# Patient Record
Sex: Female | Born: 1945
Health system: Southern US, Community
[De-identification: ages and names within clinical notes are randomized; demographics above are authoritative.]

## PROBLEM LIST (undated history)

## (undated) DIAGNOSIS — Z0271 Encounter for disability determination: Secondary | ICD-10-CM

## (undated) DIAGNOSIS — E785 Hyperlipidemia, unspecified: Secondary | ICD-10-CM

## (undated) DIAGNOSIS — M8580 Other specified disorders of bone density and structure, unspecified site: Secondary | ICD-10-CM

## (undated) DIAGNOSIS — N189 Chronic kidney disease, unspecified: Secondary | ICD-10-CM

## (undated) DIAGNOSIS — T884XXA Failed or difficult intubation, initial encounter: Secondary | ICD-10-CM

## (undated) DIAGNOSIS — K219 Gastro-esophageal reflux disease without esophagitis: Secondary | ICD-10-CM

## (undated) DIAGNOSIS — H269 Unspecified cataract: Secondary | ICD-10-CM

## (undated) DIAGNOSIS — I639 Cerebral infarction, unspecified: Secondary | ICD-10-CM

## (undated) DIAGNOSIS — Z5189 Encounter for other specified aftercare: Secondary | ICD-10-CM

## (undated) DIAGNOSIS — R569 Unspecified convulsions: Secondary | ICD-10-CM

## (undated) DIAGNOSIS — T753XXA Motion sickness, initial encounter: Secondary | ICD-10-CM

## (undated) DIAGNOSIS — I10 Essential (primary) hypertension: Secondary | ICD-10-CM

## (undated) DIAGNOSIS — S22060A Wedge compression fracture of T7-T8 vertebra, initial encounter for closed fracture: Secondary | ICD-10-CM

## (undated) DIAGNOSIS — M1990 Unspecified osteoarthritis, unspecified site: Secondary | ICD-10-CM

## (undated) DIAGNOSIS — R011 Cardiac murmur, unspecified: Secondary | ICD-10-CM

## (undated) DIAGNOSIS — T7840XA Allergy, unspecified, initial encounter: Secondary | ICD-10-CM

## (undated) DIAGNOSIS — S22069A Unspecified fracture of T7-T8 vertebra, initial encounter for closed fracture: Secondary | ICD-10-CM

## (undated) DIAGNOSIS — C801 Malignant (primary) neoplasm, unspecified: Secondary | ICD-10-CM

## (undated) HISTORY — DX: Wedge compression fracture of T7-t8 vertebra, initial encounter for closed fracture: S22.060A

## (undated) HISTORY — DX: Cerebral infarction, unspecified: I63.9

## (undated) HISTORY — DX: Gastro-esophageal reflux disease without esophagitis: K21.9

## (undated) HISTORY — DX: Unspecified fracture of T7-t8 vertebra, initial encounter for closed fracture: S22.069A

## (undated) HISTORY — PX: EYE SURGERY: SHX253

## (undated) HISTORY — PX: CATARACT EXTRACTION: SUR2

## (undated) HISTORY — PX: FRACTURE SURGERY: SHX138

## (undated) HISTORY — PX: TOE FUSION: SHX1070

## (undated) HISTORY — DX: Hyperlipidemia, unspecified: E78.5

## (undated) HISTORY — DX: Unspecified cataract: H26.9

## (undated) HISTORY — PX: SPINE SURGERY: SHX786

## (undated) HISTORY — DX: Other specified disorders of bone density and structure, unspecified site: M85.80

## (undated) HISTORY — DX: Essential (primary) hypertension: I10

## (undated) HISTORY — DX: Malignant (primary) neoplasm, unspecified: C80.1

## (undated) HISTORY — DX: Unspecified convulsions: R56.9

## (undated) HISTORY — DX: Encounter for disability determination: Z02.71

## (undated) HISTORY — DX: Allergy, unspecified, initial encounter: T78.40XA

## (undated) HISTORY — PX: BUNIONECTOMY: SHX129

## (undated) HISTORY — DX: Cardiac murmur, unspecified: R01.1

## (undated) HISTORY — DX: Encounter for other specified aftercare: Z51.89

---

## 1983-06-07 HISTORY — PX: CHOLECYSTECTOMY: SHX55

## 1993-06-06 HISTORY — PX: OTHER SURGICAL HISTORY: SHX169

## 1994-06-06 HISTORY — PX: BREAST SURGERY: SHX581

## 1995-06-07 DIAGNOSIS — C50919 Malignant neoplasm of unspecified site of unspecified female breast: Secondary | ICD-10-CM

## 1995-06-07 HISTORY — DX: Malignant neoplasm of unspecified site of unspecified female breast: C50.919

## 1997-12-18 ENCOUNTER — Other Ambulatory Visit: Admission: RE | Admit: 1997-12-18 | Discharge: 1997-12-18 | Payer: Self-pay | Admitting: Obstetrics and Gynecology

## 1998-03-02 ENCOUNTER — Ambulatory Visit (HOSPITAL_COMMUNITY): Admission: RE | Admit: 1998-03-02 | Discharge: 1998-03-02 | Payer: Self-pay | Admitting: Obstetrics and Gynecology

## 1999-05-04 ENCOUNTER — Other Ambulatory Visit: Admission: RE | Admit: 1999-05-04 | Discharge: 1999-05-04 | Payer: Self-pay | Admitting: Obstetrics and Gynecology

## 2000-07-11 ENCOUNTER — Other Ambulatory Visit: Admission: RE | Admit: 2000-07-11 | Discharge: 2000-07-11 | Payer: Self-pay | Admitting: Obstetrics and Gynecology

## 2001-11-12 ENCOUNTER — Other Ambulatory Visit: Admission: RE | Admit: 2001-11-12 | Discharge: 2001-11-12 | Payer: Self-pay | Admitting: Obstetrics and Gynecology

## 2003-02-05 ENCOUNTER — Encounter: Payer: Self-pay | Admitting: Family Medicine

## 2003-03-04 ENCOUNTER — Other Ambulatory Visit: Admission: RE | Admit: 2003-03-04 | Discharge: 2003-03-04 | Payer: Self-pay | Admitting: Obstetrics and Gynecology

## 2003-06-07 ENCOUNTER — Inpatient Hospital Stay (HOSPITAL_COMMUNITY): Admission: AC | Admit: 2003-06-07 | Discharge: 2003-06-17 | Payer: Self-pay

## 2003-06-07 ENCOUNTER — Encounter: Payer: Self-pay | Admitting: Family Medicine

## 2003-06-07 DIAGNOSIS — J80 Acute respiratory distress syndrome: Secondary | ICD-10-CM

## 2003-06-07 HISTORY — PX: ENDARTERECTOMY: SHX5162

## 2003-06-07 HISTORY — DX: Acute respiratory distress syndrome: J80

## 2003-06-07 LAB — CONVERTED CEMR LAB: Hgb A1c MFr Bld: 6.3 %

## 2003-06-10 ENCOUNTER — Encounter (INDEPENDENT_AMBULATORY_CARE_PROVIDER_SITE_OTHER): Payer: Self-pay | Admitting: *Deleted

## 2003-06-11 ENCOUNTER — Encounter: Payer: Self-pay | Admitting: Cardiovascular Disease

## 2003-06-17 ENCOUNTER — Inpatient Hospital Stay (HOSPITAL_COMMUNITY)
Admission: RE | Admit: 2003-06-17 | Discharge: 2003-07-01 | Payer: Self-pay | Admitting: Physical Medicine & Rehabilitation

## 2003-07-04 ENCOUNTER — Encounter
Admission: RE | Admit: 2003-07-04 | Discharge: 2003-08-01 | Payer: Self-pay | Admitting: Physical Medicine & Rehabilitation

## 2003-07-24 ENCOUNTER — Encounter
Admission: RE | Admit: 2003-07-24 | Discharge: 2003-10-22 | Payer: Self-pay | Admitting: Physical Medicine & Rehabilitation

## 2003-09-05 ENCOUNTER — Encounter: Payer: Self-pay | Admitting: Family Medicine

## 2004-01-16 ENCOUNTER — Encounter
Admission: RE | Admit: 2004-01-16 | Discharge: 2004-04-15 | Payer: Self-pay | Admitting: Physical Medicine & Rehabilitation

## 2004-02-05 ENCOUNTER — Encounter: Payer: Self-pay | Admitting: Family Medicine

## 2004-03-18 ENCOUNTER — Encounter
Admission: RE | Admit: 2004-03-18 | Discharge: 2004-06-16 | Payer: Self-pay | Admitting: Physical Medicine & Rehabilitation

## 2004-04-30 ENCOUNTER — Ambulatory Visit: Payer: Self-pay | Admitting: Internal Medicine

## 2004-05-24 ENCOUNTER — Emergency Department: Payer: Self-pay | Admitting: Emergency Medicine

## 2004-08-04 ENCOUNTER — Ambulatory Visit: Payer: Self-pay | Admitting: Family Medicine

## 2004-08-04 LAB — CONVERTED CEMR LAB: Hgb A1c MFr Bld: 5.2 %

## 2004-08-12 ENCOUNTER — Ambulatory Visit: Payer: Self-pay | Admitting: Family Medicine

## 2004-08-16 ENCOUNTER — Encounter: Admission: RE | Admit: 2004-08-16 | Discharge: 2004-08-16 | Payer: Self-pay | Admitting: Orthopedic Surgery

## 2004-08-18 ENCOUNTER — Ambulatory Visit (HOSPITAL_BASED_OUTPATIENT_CLINIC_OR_DEPARTMENT_OTHER): Admission: RE | Admit: 2004-08-18 | Discharge: 2004-08-18 | Payer: Self-pay | Admitting: Orthopedic Surgery

## 2004-08-18 ENCOUNTER — Ambulatory Visit (HOSPITAL_COMMUNITY): Admission: RE | Admit: 2004-08-18 | Discharge: 2004-08-18 | Payer: Self-pay | Admitting: Orthopedic Surgery

## 2004-10-06 ENCOUNTER — Ambulatory Visit: Payer: Self-pay | Admitting: Family Medicine

## 2004-10-08 ENCOUNTER — Ambulatory Visit: Payer: Self-pay | Admitting: Family Medicine

## 2004-10-15 ENCOUNTER — Encounter: Admission: RE | Admit: 2004-10-15 | Discharge: 2004-10-15 | Payer: Self-pay | Admitting: Family Medicine

## 2004-11-24 ENCOUNTER — Ambulatory Visit: Payer: Self-pay | Admitting: Family Medicine

## 2004-11-26 ENCOUNTER — Ambulatory Visit: Payer: Self-pay | Admitting: Family Medicine

## 2005-03-06 ENCOUNTER — Encounter: Payer: Self-pay | Admitting: Family Medicine

## 2005-03-06 LAB — CONVERTED CEMR LAB
Hgb A1c MFr Bld: 5.5 %
Microalbumin U total vol: 4 mg/L

## 2005-03-07 ENCOUNTER — Ambulatory Visit: Payer: Self-pay | Admitting: Family Medicine

## 2005-03-10 ENCOUNTER — Ambulatory Visit: Payer: Self-pay | Admitting: Family Medicine

## 2005-05-02 ENCOUNTER — Ambulatory Visit: Payer: Self-pay | Admitting: Family Medicine

## 2005-06-21 ENCOUNTER — Ambulatory Visit (HOSPITAL_BASED_OUTPATIENT_CLINIC_OR_DEPARTMENT_OTHER): Admission: RE | Admit: 2005-06-21 | Discharge: 2005-06-22 | Payer: Self-pay | Admitting: Orthopedic Surgery

## 2005-09-20 DIAGNOSIS — M8580 Other specified disorders of bone density and structure, unspecified site: Secondary | ICD-10-CM

## 2005-09-20 HISTORY — DX: Other specified disorders of bone density and structure, unspecified site: M85.80

## 2005-09-28 HISTORY — PX: CT LUNG SCREENING: HXRAD848

## 2005-10-11 ENCOUNTER — Ambulatory Visit: Payer: Self-pay | Admitting: Family Medicine

## 2005-10-14 ENCOUNTER — Ambulatory Visit: Payer: Self-pay | Admitting: Family Medicine

## 2005-12-15 DIAGNOSIS — Z0271 Encounter for disability determination: Secondary | ICD-10-CM

## 2005-12-15 HISTORY — DX: Encounter for disability determination: Z02.71

## 2006-02-04 ENCOUNTER — Encounter: Payer: Self-pay | Admitting: Family Medicine

## 2006-04-12 ENCOUNTER — Ambulatory Visit: Payer: Self-pay | Admitting: Family Medicine

## 2006-04-12 LAB — CONVERTED CEMR LAB: Microalbumin U total vol: 5.7 mg/L

## 2006-04-17 ENCOUNTER — Ambulatory Visit: Payer: Self-pay | Admitting: Family Medicine

## 2006-05-05 ENCOUNTER — Ambulatory Visit: Payer: Self-pay | Admitting: Family Medicine

## 2006-06-05 ENCOUNTER — Ambulatory Visit: Payer: Self-pay | Admitting: Family Medicine

## 2006-06-15 ENCOUNTER — Encounter (INDEPENDENT_AMBULATORY_CARE_PROVIDER_SITE_OTHER): Payer: Self-pay | Admitting: Internal Medicine

## 2006-07-12 ENCOUNTER — Ambulatory Visit: Payer: Self-pay | Admitting: Family Medicine

## 2006-07-26 ENCOUNTER — Encounter (INDEPENDENT_AMBULATORY_CARE_PROVIDER_SITE_OTHER): Payer: Self-pay | Admitting: Internal Medicine

## 2006-09-26 LAB — HM MAMMOGRAPHY: HM Mammogram: NORMAL

## 2006-10-04 ENCOUNTER — Encounter: Payer: Self-pay | Admitting: Family Medicine

## 2006-10-04 DIAGNOSIS — E119 Type 2 diabetes mellitus without complications: Secondary | ICD-10-CM | POA: Insufficient documentation

## 2006-10-04 DIAGNOSIS — I739 Peripheral vascular disease, unspecified: Secondary | ICD-10-CM | POA: Insufficient documentation

## 2006-10-04 DIAGNOSIS — D0590 Unspecified type of carcinoma in situ of unspecified breast: Secondary | ICD-10-CM | POA: Insufficient documentation

## 2006-10-04 DIAGNOSIS — I10 Essential (primary) hypertension: Secondary | ICD-10-CM | POA: Insufficient documentation

## 2006-10-04 DIAGNOSIS — K219 Gastro-esophageal reflux disease without esophagitis: Secondary | ICD-10-CM | POA: Insufficient documentation

## 2006-10-12 ENCOUNTER — Ambulatory Visit: Payer: Self-pay | Admitting: Family Medicine

## 2006-10-13 LAB — CONVERTED CEMR LAB: Hgb A1c MFr Bld: 5.7 % (ref 4.6–6.1)

## 2006-10-24 ENCOUNTER — Encounter: Payer: Self-pay | Admitting: Family Medicine

## 2006-11-07 ENCOUNTER — Ambulatory Visit: Payer: Self-pay | Admitting: Family Medicine

## 2007-01-02 ENCOUNTER — Ambulatory Visit: Payer: Self-pay | Admitting: Family Medicine

## 2007-03-28 LAB — CONVERTED CEMR LAB: Pap Smear: NORMAL

## 2007-05-09 ENCOUNTER — Ambulatory Visit: Payer: Self-pay | Admitting: Family Medicine

## 2007-05-10 LAB — CONVERTED CEMR LAB
ALT: 20 units/L (ref 0–35)
AST: 21 units/L (ref 0–37)
Albumin: 4.2 g/dL (ref 3.5–5.2)
Alkaline Phosphatase: 88 units/L (ref 39–117)
Basophils Absolute: 0 10*3/uL (ref 0.0–0.1)
Eosinophils Absolute: 0.2 10*3/uL (ref 0.2–0.7)
Glucose, Bld: 86 mg/dL (ref 70–99)
Hgb A1c MFr Bld: 5.8 % (ref 4.6–6.1)
LDL Cholesterol: 164 mg/dL — ABNORMAL HIGH (ref 0–99)
Lymphocytes Relative: 15 % (ref 12–46)
Lymphs Abs: 1.5 10*3/uL (ref 0.7–4.0)
MCV: 90 fL (ref 78.0–100.0)
Microalb Creat Ratio: 230.5 mg/g — ABNORMAL HIGH (ref 0.0–30.0)
Microalb, Ur: 18.6 mg/dL — ABNORMAL HIGH (ref 0.00–1.89)
Neutrophils Relative %: 76 % (ref 43–77)
Platelets: 264 10*3/uL (ref 150–400)
Potassium: 4.7 meq/L (ref 3.5–5.3)
RDW: 15.4 % (ref 11.5–15.5)
Sodium: 142 meq/L (ref 135–145)
TSH: 1.966 microintl units/mL (ref 0.350–5.50)
Total Bilirubin: 0.5 mg/dL (ref 0.3–1.2)
Total Protein: 7.4 g/dL (ref 6.0–8.3)
Triglycerides: 266 mg/dL — ABNORMAL HIGH (ref <150)
VLDL: 53 mg/dL — ABNORMAL HIGH (ref 0–40)
WBC: 10.2 10*3/uL (ref 4.0–10.5)

## 2007-05-14 ENCOUNTER — Ambulatory Visit: Payer: Self-pay | Admitting: Family Medicine

## 2007-06-18 ENCOUNTER — Ambulatory Visit: Payer: Self-pay | Admitting: Family Medicine

## 2007-06-19 LAB — CONVERTED CEMR LAB: AST: 26 units/L (ref 0–37)

## 2007-07-19 ENCOUNTER — Ambulatory Visit: Payer: Self-pay | Admitting: Family Medicine

## 2007-07-19 LAB — CONVERTED CEMR LAB
Bilirubin Urine: NEGATIVE
Glucose, Urine, Semiquant: NEGATIVE
Ketones, urine, test strip: NEGATIVE
Nitrite: NEGATIVE
Protein, U semiquant: NEGATIVE
Urobilinogen, UA: NEGATIVE

## 2007-07-20 ENCOUNTER — Encounter (INDEPENDENT_AMBULATORY_CARE_PROVIDER_SITE_OTHER): Payer: Self-pay | Admitting: Internal Medicine

## 2007-08-08 ENCOUNTER — Encounter (INDEPENDENT_AMBULATORY_CARE_PROVIDER_SITE_OTHER): Payer: Self-pay | Admitting: Internal Medicine

## 2007-08-08 ENCOUNTER — Ambulatory Visit: Payer: Self-pay | Admitting: Family Medicine

## 2007-08-13 LAB — CONVERTED CEMR LAB
ALT: 13 units/L (ref 0–35)
Cholesterol: 177 mg/dL (ref 0–200)
Total CHOL/HDL Ratio: 3.2
Triglycerides: 150 mg/dL — ABNORMAL HIGH (ref <150)
VLDL: 30 mg/dL (ref 0–40)

## 2007-08-21 ENCOUNTER — Ambulatory Visit: Payer: Self-pay | Admitting: Family Medicine

## 2007-08-21 DIAGNOSIS — M549 Dorsalgia, unspecified: Secondary | ICD-10-CM | POA: Insufficient documentation

## 2007-08-31 ENCOUNTER — Ambulatory Visit: Payer: Self-pay | Admitting: Family Medicine

## 2007-08-31 LAB — CONVERTED CEMR LAB
Bilirubin Urine: NEGATIVE
Ketones, urine, test strip: NEGATIVE
Specific Gravity, Urine: 1.01
pH: 6.5

## 2007-10-05 ENCOUNTER — Ambulatory Visit: Payer: Self-pay | Admitting: Family Medicine

## 2007-10-05 LAB — CONVERTED CEMR LAB
Glucose, Urine, Semiquant: NEGATIVE
Ketones, urine, test strip: NEGATIVE
Nitrite: NEGATIVE
Protein, U semiquant: NEGATIVE
Specific Gravity, Urine: 1.015

## 2007-10-06 ENCOUNTER — Encounter: Payer: Self-pay | Admitting: Family Medicine

## 2007-10-25 ENCOUNTER — Ambulatory Visit: Payer: Self-pay | Admitting: Family Medicine

## 2007-11-05 ENCOUNTER — Telehealth: Payer: Self-pay | Admitting: Family Medicine

## 2007-11-15 ENCOUNTER — Encounter: Payer: Self-pay | Admitting: Family Medicine

## 2008-04-29 ENCOUNTER — Ambulatory Visit: Payer: Self-pay | Admitting: Family Medicine

## 2008-04-29 LAB — CONVERTED CEMR LAB
Glucose, Urine, Semiquant: NEGATIVE
Ketones, urine, test strip: NEGATIVE
Nitrite: NEGATIVE
Protein, U semiquant: 100

## 2008-05-16 ENCOUNTER — Ambulatory Visit: Payer: Self-pay | Admitting: Family Medicine

## 2008-05-17 LAB — CONVERTED CEMR LAB
Albumin: 4 g/dL (ref 3.5–5.2)
Alkaline Phosphatase: 70 units/L (ref 39–117)
BUN: 31 mg/dL — ABNORMAL HIGH (ref 6–23)
Basophils Relative: 1.3 % (ref 0.0–3.0)
Creatinine, Ser: 1.1 mg/dL (ref 0.4–1.2)
Creatinine,U: 77.8 mg/dL
Eosinophils Relative: 1.6 % (ref 0.0–5.0)
GFR calc Af Amer: 65 mL/min
Glucose, Bld: 88 mg/dL (ref 70–99)
HCT: 36.9 % (ref 36.0–46.0)
Hemoglobin: 12.6 g/dL (ref 12.0–15.0)
MCV: 89.4 fL (ref 78.0–100.0)
Microalb Creat Ratio: 18 mg/g (ref 0.0–30.0)
Monocytes Absolute: 0.7 10*3/uL (ref 0.1–1.0)
Monocytes Relative: 10.9 % (ref 3.0–12.0)
Neutro Abs: 3.4 10*3/uL (ref 1.4–7.7)
Platelets: 302 10*3/uL (ref 150–400)
Potassium: 4 meq/L (ref 3.5–5.1)
RBC: 4.13 M/uL (ref 3.87–5.11)
Total CHOL/HDL Ratio: 3.3
Total Protein: 7.6 g/dL (ref 6.0–8.3)
WBC: 6.1 10*3/uL (ref 4.5–10.5)

## 2008-05-21 ENCOUNTER — Ambulatory Visit: Payer: Self-pay | Admitting: Family Medicine

## 2008-06-09 ENCOUNTER — Encounter: Payer: Self-pay | Admitting: Family Medicine

## 2008-06-10 ENCOUNTER — Encounter: Payer: Self-pay | Admitting: Family Medicine

## 2008-07-03 ENCOUNTER — Ambulatory Visit: Payer: Self-pay | Admitting: Family Medicine

## 2008-07-29 ENCOUNTER — Ambulatory Visit: Payer: Self-pay | Admitting: Family Medicine

## 2008-07-29 LAB — CONVERTED CEMR LAB
Protein, U semiquant: 30
Specific Gravity, Urine: <1.005
pH: 6.5

## 2008-07-30 ENCOUNTER — Encounter: Payer: Self-pay | Admitting: Family Medicine

## 2008-08-21 ENCOUNTER — Ambulatory Visit: Payer: Self-pay | Admitting: Family Medicine

## 2008-08-21 LAB — CONVERTED CEMR LAB
Bilirubin Urine: NEGATIVE
Glucose, Urine, Semiquant: NEGATIVE
Protein, U semiquant: NEGATIVE
pH: 6

## 2008-08-22 ENCOUNTER — Encounter: Payer: Self-pay | Admitting: Family Medicine

## 2008-09-29 ENCOUNTER — Encounter: Payer: Self-pay | Admitting: Family Medicine

## 2008-10-02 ENCOUNTER — Telehealth: Payer: Self-pay | Admitting: Family Medicine

## 2008-12-09 ENCOUNTER — Telehealth: Payer: Self-pay | Admitting: Family Medicine

## 2008-12-19 ENCOUNTER — Encounter: Payer: Self-pay | Admitting: Family Medicine

## 2008-12-19 ENCOUNTER — Ambulatory Visit: Payer: Self-pay | Admitting: Vascular Surgery

## 2008-12-30 ENCOUNTER — Ambulatory Visit: Payer: Self-pay | Admitting: Family Medicine

## 2009-01-26 ENCOUNTER — Ambulatory Visit: Payer: Self-pay | Admitting: Family Medicine

## 2009-01-26 LAB — CONVERTED CEMR LAB
Bilirubin Urine: NEGATIVE
Glucose, Urine, Semiquant: NEGATIVE
Ketones, urine, test strip: NEGATIVE
Specific Gravity, Urine: 1.015

## 2009-01-27 ENCOUNTER — Encounter: Payer: Self-pay | Admitting: Family Medicine

## 2009-02-10 ENCOUNTER — Ambulatory Visit: Payer: Self-pay | Admitting: Family Medicine

## 2009-02-11 ENCOUNTER — Encounter: Payer: Self-pay | Admitting: Family Medicine

## 2009-03-02 ENCOUNTER — Telehealth: Payer: Self-pay | Admitting: Family Medicine

## 2009-03-20 ENCOUNTER — Telehealth (INDEPENDENT_AMBULATORY_CARE_PROVIDER_SITE_OTHER): Payer: Self-pay | Admitting: Internal Medicine

## 2009-03-26 ENCOUNTER — Telehealth: Payer: Self-pay | Admitting: Family Medicine

## 2009-08-17 ENCOUNTER — Ambulatory Visit: Payer: Self-pay | Admitting: Family Medicine

## 2009-08-17 LAB — CONVERTED CEMR LAB
ALT: 16 units/L (ref 0–35)
AST: 23 units/L (ref 0–37)
Albumin: 3.9 g/dL (ref 3.5–5.2)
BUN: 21 mg/dL (ref 6–23)
Basophils Absolute: 0.1 10*3/uL (ref 0.0–0.1)
Basophils Relative: 0.9 % (ref 0.0–3.0)
Cholesterol: 185 mg/dL (ref 0–200)
Creatinine, Ser: 0.9 mg/dL (ref 0.4–1.2)
GFR calc non Af Amer: 67.29 mL/min (ref >60)
HCT: 38 % (ref 36.0–46.0)
Hemoglobin: 12.6 g/dL (ref 12.0–15.0)
Lymphs Abs: 1.9 10*3/uL (ref 0.7–4.0)
Microalb Creat Ratio: 20.1 mg/g (ref 0.0–30.0)
Monocytes Relative: 8.9 % (ref 3.0–12.0)
Neutro Abs: 3.4 10*3/uL (ref 1.4–7.7)
RDW: 14.5 % (ref 11.5–14.6)
TSH: 2.22 microintl units/mL (ref 0.35–5.50)
Total CHOL/HDL Ratio: 3
Total Protein: 7.3 g/dL (ref 6.0–8.3)

## 2009-08-19 ENCOUNTER — Telehealth: Payer: Self-pay | Admitting: Family Medicine

## 2009-08-19 ENCOUNTER — Ambulatory Visit: Payer: Self-pay | Admitting: Family Medicine

## 2010-01-12 ENCOUNTER — Encounter (INDEPENDENT_AMBULATORY_CARE_PROVIDER_SITE_OTHER): Payer: Self-pay | Admitting: *Deleted

## 2010-04-07 ENCOUNTER — Telehealth: Payer: Self-pay | Admitting: Family Medicine

## 2010-07-06 NOTE — Letter (Signed)
Summary: Nadara Eaton letter  [place] [facility]  [address] [place], [place] [address]   Phone: [phone]  Fax: [phone]       01/12/2010 MRN: [id number]  [facility] [place] [address] [place], [place]  [address]  Dear Ms. Garnett Farm,  [facility], and [facility] announce the retirement of Arta Silence, M.D., from full-time practice at the [facility] office effective December 03, 2009 and his plans of returning part-time.  It is important to Dr. Hetty Ely and to our practice that you understand that [facility] has seven physicians in our office for your health care needs.  We will continue to offer the same exceptional care that you have today.    Dr. Hetty Ely has spoken to many of you about his plans for retirement and returning part-time in the fall.   We will continue to work with you through the transition to schedule appointments for you in the office and meet the high standards that [place] is committed to.   Again, it is with great pleasure that we share the news that Dr. Hetty Ely will return to [facility] in October of 2011 with a reduced schedule.    If you have any questions, or would like to request an appointment with one of our physicians, please call us at [phone] and press the option for Scheduling an appointment.  We take pleasure in providing you with excellent patient care and look forward to seeing you at your next office visit.  Our [facility] Physicians are:  Tillman Abide, M.D. Laurita Quint, M.D. Roxy Manns, M.D. Kerby Nora, M.D. Hannah Beat, M.D. Ruthe Mannan, M.D. We proudly welcomed Raechel Ache, M.D. and Eustaquio Boyden, M.D. to the practice in July/August 2011.  Sincerely,  [facility]

## 2010-07-06 NOTE — Progress Notes (Signed)
Summary: rx refills   Phone Note Refill Request Call back at [phone] Message from:  Patient on August 19, 2009 3:33 PM  Refills Requested: Medication #1:  MELOXICAM 7.5 MG TABS one tab by mouth two times a day with food.  Medication #2:  LISINOPRIL 10 MG TABS Take one by mouth daily  Medication #3:  PRAVACHOL 40 MG  TABS two tabs by mouth at night Patient says that she forgot to ask you for the scripts while she was here and wants them sent to [facility] on [address].   Initial call taken by: Melody Comas,  August 19, 2009 3:43 PM  Follow-up for Phone Call        Scripts for Pravachol, Prilosec, Lisinopril and Meloxicam sent. Try to use the Meloxicam as sparingly as possible. Follow-up by: Shaune Leeks MD,  August 19, 2009 4:26 PM    Prescriptions: PRAVACHOL 40 MG  TABS (PRAVASTATIN SODIUM) two tabs by mouth at night  #180 Tablet x 3   Entered and Authorized by:   Shaune Leeks MD   Signed by:   Shaune Leeks MD on 08/19/2009   Method used:   Electronically to        [facility] (retail)       [address]       [place], [place]  [address]       Ph: [phone]       Fax: [phone]   RxID:   [id number] PRILOSEC 20 MG CPDR (OMEPRAZOLE) 1 once daily / o.t.c.  #90 x 3   Entered and Authorized by:   Shaune Leeks MD   Signed by:   Shaune Leeks MD on 08/19/2009   Method used:   Electronically to        [facility] (ret[facility])       [address]       [place], [place]  [address]       Ph: [phone]       Fax: [phone]   RxID:   [id number] LISINOPRIL 10 MG TABS (LISINOPRIL) Take one by mouth daily  #90 Tablet x 3   Entered and Authorized by:   Shaune Leeks MD   Signed by:   Shaune Leeks MD on 08/19/2009   Method used:   Electronically to        [facility] ([facility])       [address]      [place], [place]  [address]       Ph: [phone]       Fax: [phone]   RxID:   [id number] MELOXICAM 7.5 MG TABS (MELOXICAM) one tab by mouth two times a day with food.  #60 x 5   Entered and Authorized by:   Shaune Leeks MD   Signed by:   Shaune Leeks MD on 08/19/2009   Method used:   Electronically to        [facility] (retail)       [address]       [place], [place]  [address]       Ph: [phone]       Fax: [phone]   RxID:   [id number]
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
## 2010-07-06 NOTE — Progress Notes (Signed)
Summary: Rx Meloxicam  Phone Note Refill Request Call back at [phone] Message from:  [facility] on April 07, 2010 3:12 PM  Refills Requested: Medication #1:  MELOXICAM 7.5 MG TABS one tab by mouth two times a day with food.   Last Refilled: 01/07/2010  Method Requested: Electronic Initial call taken by: Sydell Axon LPN,  April 07, 2010 3:12 PM    New/Updated Medications: MELOXICAM 7.5 MG TABS (MELOXICAM) one tab by mouth two times a day with food as needed... to be used sparingly. Prescriptions: MELOXICAM 7.5 MG TABS (MELOXICAM) one tab by mouth two times a day with food as needed... to be used sparingly.  #60 x 5   Entered and Authorized by:   Shaune Leeks MD   Signed by:   Shaune Leeks MD on 04/07/2010   Method used:   Electronically to        [facility]       [address]       [place], [place]  [address]       Ph: [phone]       Fax: [phone]   RxID:   [id number]

## 2010-08-26 ENCOUNTER — Telehealth: Payer: Self-pay | Admitting: *Deleted

## 2010-08-26 NOTE — Telephone Encounter (Signed)
Pt states she has had a cough for 6 weeks, thinks she is now getting a sinus infection.  Wants to be seen but there are no appts available until Monday.  Suggested to pt that she can go to urgent care clinic at [facility].  Pt agreed, said she would go there.

## 2010-08-26 NOTE — Telephone Encounter (Signed)
Agree. I would have seen her on my time tomorrow.

## 2010-08-27 ENCOUNTER — Encounter: Payer: Self-pay | Admitting: Family Medicine

## 2010-08-27 ENCOUNTER — Ambulatory Visit: Payer: Medicare Other | Admitting: Family Medicine

## 2010-08-27 DIAGNOSIS — J019 Acute sinusitis, unspecified: Secondary | ICD-10-CM

## 2010-08-30 ENCOUNTER — Other Ambulatory Visit: Payer: Self-pay | Admitting: *Deleted

## 2010-08-30 ENCOUNTER — Encounter: Payer: Self-pay | Admitting: Family Medicine

## 2010-08-30 MED ORDER — LISINOPRIL 10 MG PO TABS: 10.0000 mg | ORAL_TABLET | Freq: Every day | ORAL | Status: DC

## 2010-08-30 NOTE — Telephone Encounter (Signed)
Opened in error
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
## 2010-09-07 NOTE — Letter (Signed)
Summary: History Form  History Form   Imported By: Eugenio Hoes 08/30/2010 10:42:46  _____________________________________________________________________  External Attachment:    Type:   Image     Comment:   External Document

## 2010-10-04 ENCOUNTER — Other Ambulatory Visit: Payer: Self-pay | Admitting: Family Medicine
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
## 2010-10-19 NOTE — Consult Note (Signed)
NEW PATIENT CONSULTATION   Katie [name], Katie [name]  DOB:  Aug 11, 1945                                       12/19/2008  [id number]#:[id number]   The patient presents for evaluation of her right carotid.  She is a very  pleasant 64 year old white female well known to me from prior left  carotid endarterectomy for symptomatic carotid disease in January 2005.  She reports that, over the past 6 months, she has had a sensation of  fullness in her left throat.  She does not have any focal deficits and  no tenderness over this area.  She obviously was concerned since she had  undergone prior left carotid surgery and is seeing [place] for further  evaluation.  She has retired since my last visit with her and is doing  well with this.  She does have well controlled blood pressure on  medication and also elevated cholesterol.  She does not smoke and does  not drink alcohol.  She is married with 1 adopted child.   REVIEW OF SYSTEMS:  GU:  Positive for chronic cystitis.  MUSCULOSKELETAL:  She does have arthritis, taking Mobic for discomfort.   MEDICATION ALLERGIES:  Codeine, Cipro, and Niaspan.   CURRENT MEDICATIONS:  Lisinopril, Mobic, omeprazole, pravastatin,  Zyrtec, and aspirin daily.   PHYSICAL EXAM:  Well-developed, well-nourished white female appearing  stated age of 86.  Blood pressure is 111/72, pulse 105, respirations 18.  Her left carotid incision is well healed.  She has no bruits  bilaterally.  Radial pulses are 2+.  She is grossly intact  neurologically.   She underwent carotid duplex, and this shows wide patency of her  endarterectomy with no evidence for recurrent stenosis.  She also has no  evidence of stenosis in her right carotid system.  I am pleased with her  result.  I explained that I do not feel that her sensation in her left  neck is related to her carotid disease.  She will see [place] again on an as  needed basis.   Larina Earthly, [name].D.  Electronically Signed   TFE/MEDQ  D:  12/19/2008  T:  12/22/2008  Job:  [id number]   cc:   Arta Silence, MD
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
## 2010-10-22 NOTE — Op Note (Signed)
Katie [name], Katie [name]                 ACCOUNT NO.:  [id number]   MEDICAL RECORD NO.:  [id number]          PATIENT TYPE:  AMB   LOCATION:  DSC                          FACILITY:  [facility]   PHYSICIAN:  Leonides Grills, M.D.     DATE OF BIRTH:  22-Oct-1945   DATE OF PROCEDURE:  06/21/2005  DATE OF DISCHARGE:                                 OPERATIVE REPORT   PREOPERATIVE DIAGNOSIS:  Right hypermobile first ray to the right hallux  valgus.   POSTOPERATIVE DIAGNOSIS:  Right hypermobile first ray to the right hallux  valgus.   OPERATION PERFORMED:  1.  Right first  tarsometatarsal joint fusion with osteotomy.  2.  Right local bone graft.  3.  Right modified McBride bunionectomy.  4.  Stress x-rays right foot.   SURGEON:  Leonides Grills, M.D.   ASSISTANT:  Lianne Cure, P.A.   ANESTHESIA:  General.   ESTIMATED BLOOD LOSS:  Minimal.   TOURNIQUET TIME:  Approximately an hour and 14 minutes.   COMPLICATIONS:  None.   DISPOSITION:  Stable to PR.   INDICATIONS FOR PROCEDURE:  The patient is a 65 year old female who has had  longstanding hallux valgus deformity with arthritic changes.  The patient  has consented for the above procedure.  All risks which include infection,  neurovascular injury, nonunion, malunion, hardware irritation, hardware  failure, stiffness, arthritis and possible future fusion of the MTP joint,  return of the deformity, prolonged recovery, excessive bleeding, were all  explained, questions were encouraged and answered.   DESCRIPTION OF PROCEDURE:  The patient was brought to the operating room and  placed in supine position after adequate general endotracheal tube  anesthesia was administered with block as well as Ancef 1 g IV piggyback.  The right lower extremity was then prepped and draped in sterile manner over  a proximally placed thigh tourniquet.  The limb was gravity exsanguinated.  Tourniquet was elevated to 290 mmHg.  A longitudinal incision over the  dorsal aspect of the right medial forefoot was then made between the EHL and  EHB.  Dissection was carried down through skin and hemostasis was obtained.  Interval was then developed between the EHL and the [facility] and each tendon was  protected within its tendon sheath respectively and the tendon was not  violated.  Neurovascular structures, namely dorsalis pedis as well as deep  peroneal and superficial peroneal nerves were retracted out of harm's way  and not violated as well.  Periosteal elevation was then performed at the  base of the first metatarsal as well as over the distal aspect of the medial  cuneiform.  First MTP joint was then entered and an osteotomy closing wedge  type was then made using a sagittal saw, osteotomizing the medial cuneiform.  This was then removed and used as local bone graft later in the procedure.  We then denuded the remaining cartilage off the base of the first metatarsal  and placed multiple 2 mm drill holes in this area.  Once this was done, we  then extended the incision distally and entered the lateral  aspect of the  first MTP joint.  A lateral capsulotomy was then made and an adductor  hallucis tendon release was then performed.  We then attempted to free up  the sesamoid laterally as well and released the capsule just superior to  this as well involving the sesamoid.  We then made a longitudinal incision  over the midline medial aspect of the great toe MTP joint.  Dissection was  carried out through skin and hemostasis was obtained.  The neurovascular  structures were identified both superiorly and inferiorly and protected  throughout the case.  An L-shaped capsulotomy was then made.  Simple  bunionectomy was then performed with a sagittal saw.  We then placed a Freer  in the joint and the lateral capsule was released adequately.  The joint was  severely arthritic.  There was actually a ridge in the midline aspect of the  joint that was completely denuded  of cartilage and this was rounded off to  make it a better articular surface.  We initially ranged the joint in the  reduced position with the ridge place and this had very poor crepitus as  well as grinding in the joint.  Once this was removed, this had better range  of motion and was more congruent.  Once this was done there was also a  tremendous amount of scar tissue immediately and this was meticulously  removed as well.  We then went back to the first TMT joint.  This was then  reduced with a two-point reduction clamp and a notch was then placed  approximately 1.5 cm to 2 cm distal to the first TMT joint.  Once this notch  was created, we then placed a 40 mm long, 3.5 mm full threaded cortical lag  screw using 3.5 and 2.5 drill holes respectively.  This had excellent  purchase and compression across the first TMT joint. We then from the  medial cuneiform to the base of the first metatarsal placed a 3.5 mm fully  threaded cortical lag screw using 3.5 and 2.5 drill holes respectively. This  again had excellent compression and maintenance of the correction.  We then  obtained stress x-rays in the AP and lateral planes and showed no gross  motion across the fusion site, fixation in the proper position and  correction excellent as well.  We then from the local bone graft obtained  placed stress-strain relieving bone graft after the area was copiously  irrigated with normal saline. This was done using a bur as well as packing  the graft into place. We then went back to the great toe MP joint and  repaired the capsule by advancing it both superiorly and proximally using 2-  0 Vicryl suture. This has an excellent repair.  We ranged the joint at the  end and there was a grind in the joint which was obviously unsatisfying;  however, it was felt that we would give the joint a fighting chance before  we would fuse it and this was explained to the patient as well.  Also of note, we did plantar  flex the first ray adequately and this was palpated as  well due to the fact that the patient had a Morton's foot with a longer  second and third metatarsal shafts and a shorter first and fourth metatarsal  shaft as well.  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  Tourniquet was deflated and  hemostasis was obtained.  There was palpable dorsalis pedis pulse.  There  was no pulsatile bleeding.  Subcutaneous was closed with 3-0 Vicryl.  Skin  was closed with 4-0 nylon over all wounds.  Sterile dressing was applied.  Roger Mann dressing was applied. Modified Jones dressing was applied.  The  patient was stable to the PR.      Leonides Grills, M.D.  Electronically Signed     PB/MEDQ  D:  06/21/2005  T:  06/21/2005  Job:  [id number]

## 2010-10-22 NOTE — Op Note (Signed)
Katie [name], Katie [name]                 ACCOUNT NO.:  [id number]   MEDICAL RECORD NO.:  [id number]          PATIENT TYPE:  AMB   LOCATION:  DSC                          FACILITY:  [facility]   PHYSICIAN:  Leonides Grills, M.D.     DATE OF BIRTH:  [birth year]   DATE OF PROCEDURE:  08/18/2004  DATE OF DISCHARGE:                                 OPERATIVE REPORT   PREOPERATIVE DIAGNOSES:  1.  Left hypermobile first ray.  2.  Left hallux valgus.   POSTOPERATIVE DIAGNOSES:  1.  Left hypermobile first ray.  2.  Left hallux valgus.   PROCEDURE:  1.  Left first correctional tarsometatarsal joint fusion with osteotomy.  2.  Left local bone graft.  3.  Stress x-ray by foot.  4.  Left modified McBride bunionectomy.   ANESTHESIA:  General endotracheal tube with ankle block.   SURGEON:  Leonides Grills, M.D.   ASSISTANT:  Lianne Cure, P.A.   ESTIMATED BLOOD LOSS:  Minimal.   TOURNIQUET TIME:  Approximately 1 hour.   COMPLICATIONS:  None.   DISPOSITION:  Stable to PR.   INDICATIONS:  This is a 65 year old female who has had long-standing left  great toe pain that was interfering with her life and what she wants to do.  She was acceptable of the above procedure.  All risks which include  infection, neurovascular injury, nonunion, malunion, hardware irritation or  failure, persistent pain, worse pain, stiffness, arthritis, prolonged  recovery, recurrence of deformity were all explained, questions encouraged  and answered.   DESCRIPTION OF PROCEDURE:  The patient was brought to the operating room and  placed in the supine position. After adequate general endotracheal  anesthesia was administered with ankle block as well Ancef 1 g IV piggyback,  the left lower extremity was then prepped and draped in a sterile manner.  A  bump was placed under the ipsilateral hip.  The limb was then gravity  exsanguinated and tourniquet was elevated to 290 mmHg.  A longitudinal  incision in the first web space  was then made.  Dissection was carried down  through the skin.  Hemostasis was obtained.  The first TMP joint was then  entered.  The base of the dorsal aspect of the metatarsal was then  skeletonized.  A osteotomy was then created and medial cuneiform using a  sagittal saw.  This was closing wedge laterally based.  Once this was  removed, the remaining cartilage on the base of the first metatarsal was  then removed with a curved 1/4 inch osteotome as well as a synovectomy  rongeur.  Multiple 2 mm drill holes were placed on each side of the joint  and the joint had perfect apposition.  Local bone graft was obtained from  the removal.  This bone was then placed on the back table for future bone  graft.  We then dissected distally, dorsally and released the lateral  capsule protecting the soft tissues.  The adductor tendon was also released  as well.  We then made a longitudinal incision over the medial aspect of the  great toe at the IP joint.  Dissection was carried down through the skin.  Hemostasis was obtained.  Neurovascular structures were identified both  superiorly and inferiorly and protected throughout the case.  A sheath  capsulotomy was then made.  Simple bunionectomy was then performed with a  sagittal saw.  The area was copiously irrigated with normal saline.  We then  went back to the first TMT joint.  This was then anatomically reduced and  held with the two-point reduction clamp.  A bur was then used to create a  notch in the base of the proximal phalanx, approximately 2 cm distal to the  first TMT joint.  The 3.5 followed by a 2.5 mm holes were created  respectively, and a 40 mm long 3.5 mm fully threaded cortical lag screw was  then placed with excellent compression and maintenance of the correction.  A  bur was then used to recess at the distal aspect of the middle cuneiform.  A  3.5 x 2.5 mm drill hole was placed respectively at 32 mm long.  A 3.5 mm  fully threaded  cortical lag screw was then placed.  This again held the toe  in excellent alignment.  Stress x-rays were obtained in the AP and lateral  planes.  This showed that this had an excellent correction.  There was no  gross motion and fixation was in the proper position.  The area was  copiously irrigated with normal saline.  Stress and strain-relieving local  bone graft was obtained throughout the procedure was then placed in the  first TMT joint.  We then repaired the capsule with 2-0 Vicryl suture  cinching the capsule proximal superiorly.  This had an outstanding repair  and held the sesamoids in proper position.  Range of motion in the great toe  MTP joint was excellent without any crepitants.  Again, this area was  copiously irrigated with normal saline.  Tourniquet was deflated.  Hemostasis was obtained.  Subcu was closed with 3-0 Vicryl.  Skin was closed  with 4-0 nylon.  Sterile dressing was applied.  A modified Jones dressing  was applied with the ankle in neutral dorsiflexion.  The patient was stable  to PR.      PB/MEDQ  D:  08/18/2004  T:  08/18/2004  Job:  [id number]
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
## 2010-11-06 ENCOUNTER — Other Ambulatory Visit: Payer: Self-pay | Admitting: Family Medicine

## 2010-11-06 NOTE — Telephone Encounter (Signed)
Is it okay to refill this ?  

## 2010-11-06 NOTE — Telephone Encounter (Signed)
Ok. I approved.

## 2010-12-04 ENCOUNTER — Encounter: Payer: Self-pay | Admitting: Family Medicine

## 2010-12-17 ENCOUNTER — Other Ambulatory Visit (INDEPENDENT_AMBULATORY_CARE_PROVIDER_SITE_OTHER): Payer: Medicare Other | Admitting: Family Medicine

## 2010-12-17 DIAGNOSIS — I10 Essential (primary) hypertension: Secondary | ICD-10-CM

## 2010-12-17 DIAGNOSIS — E119 Type 2 diabetes mellitus without complications: Secondary | ICD-10-CM

## 2010-12-17 LAB — LDL CHOLESTEROL, DIRECT: Direct LDL: 146.7 mg/dL

## 2010-12-17 LAB — HEMOGLOBIN A1C: Hgb A1c MFr Bld: 6.7 % — ABNORMAL HIGH (ref 4.6–6.5)

## 2010-12-17 LAB — COMPREHENSIVE METABOLIC PANEL
ALT: 16 U/L (ref 0–35)
AST: 19 U/L (ref 0–37)
Albumin: 4.2 g/dL (ref 3.5–5.2)
Alkaline Phosphatase: 71 U/L (ref 39–117)
Potassium: 4.8 mEq/L (ref 3.5–5.1)
Sodium: 138 mEq/L (ref 135–145)
Total Protein: 7.7 g/dL (ref 6.0–8.3)

## 2010-12-17 LAB — MICROALBUMIN / CREATININE URINE RATIO: Microalb, Ur: 1.2 mg/dL (ref 0.0–1.9)

## 2010-12-17 LAB — LIPID PANEL
Total CHOL/HDL Ratio: 4
VLDL: 45.2 mg/dL — ABNORMAL HIGH (ref 0.0–40.0)

## 2010-12-20 ENCOUNTER — Other Ambulatory Visit: Payer: Self-pay

## 2010-12-21 ENCOUNTER — Ambulatory Visit (INDEPENDENT_AMBULATORY_CARE_PROVIDER_SITE_OTHER): Payer: Medicare Other | Admitting: Family Medicine

## 2010-12-21 ENCOUNTER — Encounter: Payer: Self-pay | Admitting: Family Medicine

## 2010-12-21 DIAGNOSIS — M549 Dorsalgia, unspecified: Secondary | ICD-10-CM

## 2010-12-21 DIAGNOSIS — E7800 Pure hypercholesterolemia, unspecified: Secondary | ICD-10-CM | POA: Insufficient documentation

## 2010-12-21 DIAGNOSIS — E119 Type 2 diabetes mellitus without complications: Secondary | ICD-10-CM

## 2010-12-21 DIAGNOSIS — I10 Essential (primary) hypertension: Secondary | ICD-10-CM

## 2010-12-21 MED ORDER — MELOXICAM 7.5 MG PO TABS: 7.5000 mg | ORAL_TABLET | Freq: Two times a day (BID) | ORAL | Status: DC

## 2010-12-21 MED ORDER — PRAVASTATIN SODIUM 40 MG PO TABS: ORAL_TABLET | ORAL | Status: DC

## 2010-12-21 MED ORDER — LISINOPRIL 10 MG PO TABS: 10.0000 mg | ORAL_TABLET | Freq: Every day | ORAL | Status: DC

## 2010-12-21 NOTE — Progress Notes (Signed)
  Subjective:    Patient ID: Katie [name], female    DOB: 08/18/1945, 65 y.o.   MRN: [id number]  HPI Pt here for Comp Exam. She sees Dr Rosalio Macadamia for her Gyn care. Mammo 10/12/10 was nml.  She last saw Dr Joellyn Quails two years ago and has had her last Pap. She will not get another one. She is doing well with no complaints.  She has a sinus infection a few months ago and went to  [facility] and was well treated.     Review of Systems  Constitutional: Negative for fever, chills, diaphoresis, fatigue and unexpected weight change.  HENT: Negative for hearing loss, ear pain, rhinorrhea, trouble swallowing and tinnitus.   Eyes: Negative for pain, discharge and visual disturbance.       Last eye exam Sep.  Respiratory: Negative for cough, shortness of breath and wheezing.   Cardiovascular: Negative for chest pain, palpitations and leg swelling.       No Fainting or Fatigue.  Gastrointestinal: Negative for nausea, vomiting, abdominal pain, diarrhea, constipation and blood in stool.       No Heartburn  Genitourinary: Negative for dysuria and frequency. Vaginal discharge: chronic.  Musculoskeletal: Positive for arthralgias. Negative for myalgias and back pain.  Skin: Negative for rash.       No Itching or Dryness.  Neurological: Negative for tremors and numbness.       No Tingling. No Balance Problems.  Hematological: Negative for adenopathy. Does not bruise/bleed easily.  Psychiatric/Behavioral: Negative for dysphoric mood and agitation.       Objective:   Physical Exam  Constitutional: She is oriented to person, place, and time. She appears well-developed and well-nourished. No distress.  HENT:  Head: Normocephalic and atraumatic.  Left Ear: External ear normal.  Nose: Nose normal.  Mouth/Throat: Oropharynx is clear and moist. No oropharyngeal exudate.  Eyes: Conjunctivae and EOM are normal. Pupils are equal, round, and reactive to light. No scleral icterus.  Neck: Normal range of  motion. Neck supple. No thyromegaly present.  Cardiovascular: Normal rate, regular rhythm and normal heart sounds.  Exam reveals no friction rub.   No murmur heard. Pulmonary/Chest: Effort normal and breath sounds normal. No respiratory distress. She has no wheezes. She has no rales.  Abdominal: Soft. Bowel sounds are normal. She exhibits no mass. There is no tenderness.  Musculoskeletal: Normal range of motion. She exhibits no edema and no tenderness.  Lymphadenopathy:    She has no cervical adenopathy.  Neurological: She is alert and oriented to person, place, and time. She has normal reflexes.  Skin: Skin is warm and dry. No rash noted. She is not diaphoretic. No erythema.  Psychiatric: She has a normal mood and affect. Her behavior is normal. Judgment and thought content normal.          Assessment & Plan:  HMPE  I have personally reviewed the Medicare Annual Wellness questionnaire and have noted 1. The patient's medical and social history 2. Their use of alcohol, tobacco or illicit drugs 3. Their current medications and supplements 4. The patient's functional ability including ADL's, fall risks, home safety risks and hearing or visual             impairment. 5. Diet and physical activities 6. Evidence for depression or mood disorders

## 2010-12-21 NOTE — Assessment & Plan Note (Addendum)
Sugar control still acceptable. Discussed trying tio get lower via diet and exercise.  Lab Results  Component Value Date   HGBA1C 6.7* 12/17/2010    Discussed results of chol. She can't afford Lipitor at present but when it is actually generic and treated that way, she is willing to switch.

## 2010-12-21 NOTE — Assessment & Plan Note (Signed)
Good control. Cont curr meds. BP Readings from Last 3 Encounters:  12/21/10 130/80  08/27/10 168/97  08/19/09 120/80

## 2010-12-21 NOTE — Assessment & Plan Note (Signed)
LDL needs to be lower than 100. Will take max Lipitor but can't afford it right now. Discuss in future.

## 2010-12-21 NOTE — Assessment & Plan Note (Signed)
Uses Meloxicam daily and took Darvocet but can't get it any more and can't use Vicodin or Percocet. Doesn't want anything strong. Will put up with things for now.

## 2010-12-23 ENCOUNTER — Encounter: Payer: Self-pay | Admitting: Family Medicine

## 2011-11-01 ENCOUNTER — Encounter: Payer: Self-pay | Admitting: Family Medicine

## 2011-11-01 DIAGNOSIS — R918 Other nonspecific abnormal finding of lung field: Secondary | ICD-10-CM | POA: Insufficient documentation

## 2011-12-06 ENCOUNTER — Encounter: Payer: Self-pay | Admitting: Family Medicine

## 2011-12-13 ENCOUNTER — Other Ambulatory Visit: Payer: Self-pay

## 2011-12-13 ENCOUNTER — Other Ambulatory Visit: Payer: Self-pay | Admitting: Family Medicine

## 2011-12-13 MED ORDER — MELOXICAM 7.5 MG PO TABS: 7.5000 mg | ORAL_TABLET | Freq: Two times a day (BID) | ORAL | Status: DC

## 2011-12-13 NOTE — Telephone Encounter (Signed)
Pt request refill Meloxicam; out of med. Pt last seen Dr Hetty Ely 12/21/10 ; pt has CPX on 12/27/11.Please advise.[employer].

## 2011-12-13 NOTE — Telephone Encounter (Signed)
Sent!

## 2011-12-13 NOTE — Telephone Encounter (Signed)
Received refill request electronically from pharmacy. Is it okay to refill? 

## 2011-12-13 NOTE — Telephone Encounter (Signed)
Sent today with prev request.

## 2011-12-16 ENCOUNTER — Other Ambulatory Visit: Payer: Self-pay | Admitting: Family Medicine

## 2011-12-16 DIAGNOSIS — E119 Type 2 diabetes mellitus without complications: Secondary | ICD-10-CM

## 2011-12-21 ENCOUNTER — Other Ambulatory Visit (INDEPENDENT_AMBULATORY_CARE_PROVIDER_SITE_OTHER): Payer: Medicare Other

## 2011-12-21 DIAGNOSIS — E119 Type 2 diabetes mellitus without complications: Secondary | ICD-10-CM

## 2011-12-21 LAB — LIPID PANEL
Total CHOL/HDL Ratio: 4
VLDL: 50.6 mg/dL — ABNORMAL HIGH (ref 0.0–40.0)

## 2011-12-21 LAB — HEMOGLOBIN A1C: Hgb A1c MFr Bld: 6.4 % (ref 4.6–6.5)

## 2011-12-21 LAB — COMPREHENSIVE METABOLIC PANEL
ALT: 18 U/L (ref 0–35)
Albumin: 4.1 g/dL (ref 3.5–5.2)
Chloride: 102 mEq/L (ref 96–112)
GFR: 61.26 mL/min (ref >60.00)
Glucose, Bld: 109 mg/dL — ABNORMAL HIGH (ref 70–99)
Total Bilirubin: 0.4 mg/dL (ref 0.3–1.2)
Total Protein: 7.8 g/dL (ref 6.0–8.3)

## 2011-12-21 LAB — LDL CHOLESTEROL, DIRECT: Direct LDL: 119.1 mg/dL

## 2011-12-27 ENCOUNTER — Ambulatory Visit (INDEPENDENT_AMBULATORY_CARE_PROVIDER_SITE_OTHER): Payer: Medicare Other | Admitting: Family Medicine

## 2011-12-27 ENCOUNTER — Encounter: Payer: Self-pay | Admitting: Family Medicine

## 2011-12-27 VITALS — BP 126/74 | HR 96 | Temp 98.5°F | Ht 58.5 in | Wt 162.0 lb

## 2011-12-27 DIAGNOSIS — Z0000 Encounter for general adult medical examination without abnormal findings: Secondary | ICD-10-CM

## 2011-12-27 DIAGNOSIS — I6350 Cerebral infarction due to unspecified occlusion or stenosis of unspecified cerebral artery: Secondary | ICD-10-CM

## 2011-12-27 DIAGNOSIS — I639 Cerebral infarction, unspecified: Secondary | ICD-10-CM

## 2011-12-27 DIAGNOSIS — I10 Essential (primary) hypertension: Secondary | ICD-10-CM

## 2011-12-27 DIAGNOSIS — Z780 Asymptomatic menopausal state: Secondary | ICD-10-CM

## 2011-12-27 DIAGNOSIS — Z87440 Personal history of urinary (tract) infections: Secondary | ICD-10-CM

## 2011-12-27 DIAGNOSIS — Z23 Encounter for immunization: Secondary | ICD-10-CM

## 2011-12-27 DIAGNOSIS — D0590 Unspecified type of carcinoma in situ of unspecified breast: Secondary | ICD-10-CM

## 2011-12-27 DIAGNOSIS — E7800 Pure hypercholesterolemia, unspecified: Secondary | ICD-10-CM

## 2011-12-27 DIAGNOSIS — E119 Type 2 diabetes mellitus without complications: Secondary | ICD-10-CM

## 2011-12-27 DIAGNOSIS — M549 Dorsalgia, unspecified: Secondary | ICD-10-CM

## 2011-12-27 DIAGNOSIS — Z1211 Encounter for screening for malignant neoplasm of colon: Secondary | ICD-10-CM

## 2011-12-27 MED ORDER — MELOXICAM 7.5 MG PO TABS: 7.5000 mg | ORAL_TABLET | Freq: Two times a day (BID) | ORAL | Status: DC

## 2011-12-27 MED ORDER — ESTRADIOL 0.1 MG/GM VA CREA: TOPICAL_CREAM | VAGINAL | Status: DC

## 2011-12-27 MED ORDER — PRAVASTATIN SODIUM 40 MG PO TABS: ORAL_TABLET | ORAL | Status: DC

## 2011-12-27 MED ORDER — LISINOPRIL 10 MG PO TABS: 10.0000 mg | ORAL_TABLET | Freq: Every day | ORAL | Status: DC

## 2011-12-27 NOTE — Patient Instructions (Addendum)
I would get a flu shot each fall.   Check with your insurance to see if they will cover the shingles shot. Go to the lab on the way out.  We'll contact you with your lab report. See Shirlee Limerick about your referral before you leave today. Take care.  Recheck A1c in 6 months before a visit with Para March.

## 2011-12-28 ENCOUNTER — Encounter: Payer: Self-pay | Admitting: Family Medicine

## 2011-12-28 DIAGNOSIS — Z87440 Personal history of urinary (tract) infections: Secondary | ICD-10-CM | POA: Insufficient documentation

## 2011-12-28 DIAGNOSIS — I639 Cerebral infarction, unspecified: Secondary | ICD-10-CM | POA: Insufficient documentation

## 2011-12-28 DIAGNOSIS — Z0000 Encounter for general adult medical examination without abnormal findings: Secondary | ICD-10-CM | POA: Insufficient documentation

## 2011-12-28 NOTE — Assessment & Plan Note (Signed)
No new symptoms.  No change in meds.

## 2011-12-28 NOTE — Assessment & Plan Note (Signed)
Will soon progress to survivor's clinic at [facility].  Mammogram and breast exam per [facility].

## 2011-12-28 NOTE — Assessment & Plan Note (Addendum)
She is able to tolerate with current dose of meloxicam.  No change in meds.  Cr okay.  >45 min spent with face to face with patient, >50% counseling and/or coordinating care, esp with other medical concerns.

## 2011-12-28 NOTE — Assessment & Plan Note (Signed)
A1c controlled, continue with diet and work on weight.  Recheck again in 6 months.

## 2011-12-28 NOTE — Assessment & Plan Note (Signed)
H/o mult infections, improved with topical estrogen prev through urology and she wishes to continue.  Much fewer UTIs since starting treatment.  Continue as is.

## 2011-12-28 NOTE — Assessment & Plan Note (Signed)
Controlled, continue with diet and work on weight.   No change in meds.
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
## 2011-12-28 NOTE — Assessment & Plan Note (Signed)
Mild elevation, continue with diet and work on weight.   No change in meds.

## 2012-01-04 ENCOUNTER — Encounter: Payer: Self-pay | Admitting: Family Medicine

## 2012-01-05 ENCOUNTER — Encounter: Payer: Self-pay | Admitting: *Deleted

## 2012-05-07 ENCOUNTER — Ambulatory Visit: Payer: Self-pay | Admitting: Ophthalmology

## 2012-05-18 ENCOUNTER — Ambulatory Visit: Payer: Self-pay | Admitting: Ophthalmology

## 2012-06-21 ENCOUNTER — Other Ambulatory Visit: Payer: Medicare Other

## 2012-06-22 ENCOUNTER — Other Ambulatory Visit (INDEPENDENT_AMBULATORY_CARE_PROVIDER_SITE_OTHER): Payer: Medicare Other

## 2012-06-22 DIAGNOSIS — E119 Type 2 diabetes mellitus without complications: Secondary | ICD-10-CM

## 2012-06-22 LAB — HEMOGLOBIN A1C: Hgb A1c MFr Bld: 6.2 % (ref 4.6–6.5)

## 2012-06-26 ENCOUNTER — Encounter: Payer: Self-pay | Admitting: Family Medicine

## 2012-06-26 ENCOUNTER — Ambulatory Visit (INDEPENDENT_AMBULATORY_CARE_PROVIDER_SITE_OTHER): Payer: Medicare Other | Admitting: Family Medicine

## 2012-06-26 VITALS — BP 124/76 | HR 72 | Temp 98.3°F | Wt 158.0 lb

## 2012-06-26 DIAGNOSIS — M79673 Pain in unspecified foot: Secondary | ICD-10-CM

## 2012-06-26 DIAGNOSIS — H6980 Other specified disorders of Eustachian tube, unspecified ear: Secondary | ICD-10-CM

## 2012-06-26 DIAGNOSIS — M79609 Pain in unspecified limb: Secondary | ICD-10-CM

## 2012-06-26 DIAGNOSIS — E119 Type 2 diabetes mellitus without complications: Secondary | ICD-10-CM

## 2012-06-26 MED ORDER — TRAMADOL HCL 50 MG PO TABS: 50.0000 mg | ORAL_TABLET | Freq: Three times a day (TID) | ORAL | Status: DC | PRN

## 2012-06-26 MED ORDER — FLUTICASONE PROPIONATE 50 MCG/ACT NA SUSP: NASAL | Status: DC

## 2012-06-26 NOTE — Patient Instructions (Addendum)
Check with your insurance to see if they will cover the shingles shot. Try the flonase and use nasal saline.   Take tramadol with the mobic for your foot pain.  Schedule a physical for later in 2014, when the flu shot is available.

## 2012-06-26 NOTE — Progress Notes (Signed)
L foot pain.  H/o arthritis. Had used mobic and a boot prev with some help.  She plans on getting back in the boot shortly.  She was asking about options for pain medicine. She had prev relief with darvocet.  She isn't on tramadol currently.    B ear cracking going on for weeks now.  The ears episodically stop up.  She has episodic sx now.  No FCNAVD.  Stuffy and runny, esp in AM. She uses otc allergy meds and has been on allergy shots prev w/o help.   Diabetes:  No meds Hypoglycemic episodes:no sx Hyperglycemic episodes: no sx Feet problems: as above, pain noted due to OA Blood Sugars averaging: not checked A1c improved, discussed.   PMH and SH reviewed  Meds, vitals, and allergies reviewed.   ROS: See HPI.  Otherwise negative.    GEN: nad, alert and oriented HEENT: mucous membranes moist, tm w/o erythema x2, nasal exam stuffy, OP with mild cobblestoning NECK: supple w/o LA CV: rrr. PULM: ctab, no inc wob ABD: soft, +bs EXT: no edema SKIN: no acute rash L foot tender but not red in the medial midfoot but not ttp along the plantar fascia.   Diabetic foot exam: Normal inspection No skin breakdown No calluses  Normal DP pulses Normal sensation to light touch and monofilament Nails normal

## 2012-06-27 ENCOUNTER — Encounter: Payer: Self-pay | Admitting: Family Medicine

## 2012-06-27 DIAGNOSIS — H6980 Other specified disorders of Eustachian tube, unspecified ear: Secondary | ICD-10-CM | POA: Insufficient documentation

## 2012-06-27 DIAGNOSIS — M79673 Pain in unspecified foot: Secondary | ICD-10-CM | POA: Insufficient documentation

## 2012-06-27 NOTE — Assessment & Plan Note (Signed)
Likely, add on nasal steroids.  She should be able to tolerate this w/o sig elevation in sugar.  She agrees.

## 2012-06-27 NOTE — Assessment & Plan Note (Signed)
No meds, diet controlled and she'll work on diet/weight.  Labs discussed.  She agrees.  Recheck in ~6 months.

## 2012-06-27 NOTE — Assessment & Plan Note (Signed)
Add on tramadol and she'll get back in the boot, will notify us if not improved.  She agrees.

## 2012-06-28 ENCOUNTER — Ambulatory Visit: Payer: Medicare Other | Admitting: Family Medicine

## 2012-07-02 ENCOUNTER — Ambulatory Visit: Payer: Self-pay | Admitting: Ophthalmology

## 2012-08-16 ENCOUNTER — Ambulatory Visit (INDEPENDENT_AMBULATORY_CARE_PROVIDER_SITE_OTHER): Payer: Medicare Other | Admitting: Family Medicine

## 2012-08-16 ENCOUNTER — Encounter: Payer: Self-pay | Admitting: Family Medicine

## 2012-08-16 VITALS — BP 134/72 | HR 96 | Temp 98.3°F

## 2012-08-16 DIAGNOSIS — J019 Acute sinusitis, unspecified: Secondary | ICD-10-CM

## 2012-08-16 MED ORDER — AMOXICILLIN-POT CLAVULANATE 875-125 MG PO TABS: 1.0000 | ORAL_TABLET | Freq: Two times a day (BID) | ORAL | Status: DC

## 2012-08-16 NOTE — Assessment & Plan Note (Signed)
Nontoxic.  Supportive tx with Augmentin.  F/u prn.  She agrees.

## 2012-08-16 NOTE — Patient Instructions (Addendum)
Start the antibiotics and try to get some rest.  Drink plenty of fluids.  This should gradually improve.

## 2012-08-16 NOTE — Progress Notes (Signed)
3 weeks ago her sx started.  Sick grandchild at home prev.  Pt thought she was improving and then worsened again.  L ear crackling.  Vertigo, positional.  Sore teeth, face is sore.  No fevers.  Thick nasal discharge.    Meds, vitals, and allergies reviewed.   ROS: See HPI.  Otherwise, noncontributory.  GEN: nad, alert and oriented HEENT: mucous membranes moist, tm w/o erythema, nasal exam w/o erythema, clear discharge noted,  OP with cobblestoning, max sinuses ttp B NECK: supple w/o LA CV: rrr.   PULM: ctab, no inc wob EXT: no edema

## 2013-01-07 ENCOUNTER — Other Ambulatory Visit: Payer: Self-pay | Admitting: Family Medicine

## 2013-01-07 NOTE — Telephone Encounter (Signed)
Sent meloxicam. If the estrace was also requested, please send it.  Thanks.

## 2013-01-07 NOTE — Telephone Encounter (Signed)
Faxed refill request. Please advise.  

## 2013-01-07 NOTE — Telephone Encounter (Signed)
Pt left v/m requesting refill lisinopril and pravastatin to [facility] [address]. Advised done and also request refill meloxicam to [facility] [address]. Pt scheduled CPX 03/21/13.

## 2013-01-08 MED ORDER — ESTRADIOL 0.1 MG/GM VA CREA: TOPICAL_CREAM | VAGINAL | Status: DC

## 2013-03-04 ENCOUNTER — Ambulatory Visit: Payer: Medicare Other

## 2013-03-04 ENCOUNTER — Ambulatory Visit (INDEPENDENT_AMBULATORY_CARE_PROVIDER_SITE_OTHER): Payer: Medicare Other

## 2013-03-04 ENCOUNTER — Other Ambulatory Visit: Payer: Self-pay | Admitting: Family Medicine

## 2013-03-04 DIAGNOSIS — Z23 Encounter for immunization: Secondary | ICD-10-CM

## 2013-03-04 DIAGNOSIS — E119 Type 2 diabetes mellitus without complications: Secondary | ICD-10-CM

## 2013-03-04 DIAGNOSIS — R937 Abnormal findings on diagnostic imaging of other parts of musculoskeletal system: Secondary | ICD-10-CM

## 2013-03-18 ENCOUNTER — Other Ambulatory Visit (INDEPENDENT_AMBULATORY_CARE_PROVIDER_SITE_OTHER): Payer: Medicare Other

## 2013-03-18 DIAGNOSIS — E119 Type 2 diabetes mellitus without complications: Secondary | ICD-10-CM

## 2013-03-18 DIAGNOSIS — R937 Abnormal findings on diagnostic imaging of other parts of musculoskeletal system: Secondary | ICD-10-CM

## 2013-03-18 LAB — COMPREHENSIVE METABOLIC PANEL
AST: 19 U/L (ref 0–37)
Albumin: 4 g/dL (ref 3.5–5.2)
Alkaline Phosphatase: 63 U/L (ref 39–117)
BUN: 24 mg/dL — ABNORMAL HIGH (ref 6–23)
Potassium: 4.7 mEq/L (ref 3.5–5.1)

## 2013-03-18 LAB — LIPID PANEL
HDL: 55.3 mg/dL (ref >39.00)
Total CHOL/HDL Ratio: 4
Triglycerides: 272 mg/dL — ABNORMAL HIGH (ref 0.0–149.0)

## 2013-03-18 LAB — TSH: TSH: 4.21 u[IU]/mL (ref 0.35–5.50)

## 2013-03-21 ENCOUNTER — Ambulatory Visit (INDEPENDENT_AMBULATORY_CARE_PROVIDER_SITE_OTHER): Payer: Medicare Other | Admitting: Family Medicine

## 2013-03-21 ENCOUNTER — Encounter: Payer: Self-pay | Admitting: Family Medicine

## 2013-03-21 VITALS — BP 134/76 | HR 71 | Temp 97.7°F | Ht 58.5 in | Wt 165.8 lb

## 2013-03-21 DIAGNOSIS — E7800 Pure hypercholesterolemia, unspecified: Secondary | ICD-10-CM

## 2013-03-21 DIAGNOSIS — Z0000 Encounter for general adult medical examination without abnormal findings: Secondary | ICD-10-CM

## 2013-03-21 DIAGNOSIS — I6350 Cerebral infarction due to unspecified occlusion or stenosis of unspecified cerebral artery: Secondary | ICD-10-CM

## 2013-03-21 DIAGNOSIS — I10 Essential (primary) hypertension: Secondary | ICD-10-CM

## 2013-03-21 DIAGNOSIS — E119 Type 2 diabetes mellitus without complications: Secondary | ICD-10-CM

## 2013-03-21 DIAGNOSIS — I639 Cerebral infarction, unspecified: Secondary | ICD-10-CM

## 2013-03-21 DIAGNOSIS — Z1211 Encounter for screening for malignant neoplasm of colon: Secondary | ICD-10-CM

## 2013-03-21 DIAGNOSIS — J019 Acute sinusitis, unspecified: Secondary | ICD-10-CM

## 2013-03-21 MED ORDER — FLUTICASONE PROPIONATE 50 MCG/ACT NA SUSP: NASAL | Status: DC

## 2013-03-21 MED ORDER — OMEPRAZOLE MAGNESIUM 20 MG PO TBEC: 20.0000 mg | DELAYED_RELEASE_TABLET | Freq: Every day | ORAL | Status: DC

## 2013-03-21 MED ORDER — CHOLECALCIFEROL 25 MCG (1000 UT) PO CAPS: 1000.0000 [IU] | ORAL_CAPSULE | Freq: Every day | ORAL | Status: DC

## 2013-03-21 MED ORDER — PRAVASTATIN SODIUM 80 MG PO TABS: ORAL_TABLET | ORAL | Status: DC

## 2013-03-21 MED ORDER — ESTRADIOL 0.1 MG/GM VA CREA: TOPICAL_CREAM | VAGINAL | Status: DC

## 2013-03-21 MED ORDER — MELOXICAM 7.5 MG PO TABS: ORAL_TABLET | ORAL | Status: DC

## 2013-03-21 MED ORDER — LISINOPRIL 10 MG PO TABS: 10.0000 mg | ORAL_TABLET | Freq: Every day | ORAL | Status: DC

## 2013-03-21 MED ORDER — AMOXICILLIN-POT CLAVULANATE 875-125 MG PO TABS: 1.0000 | ORAL_TABLET | Freq: Two times a day (BID) | ORAL | Status: DC

## 2013-03-21 NOTE — Patient Instructions (Signed)
Go to the lab on the way out.  We'll contact you with your lab report. Check with your insurance to see if they will cover the shingles shot.  Add on 1000 units of vit D a day.  Use the antibiotics if you don't improve soon with the flonse.  Take care.   Recheck A1c in about 6 months before a visit.  Glad to see you.

## 2013-03-21 NOTE — Progress Notes (Signed)
I have personally reviewed the Medicare Annual Wellness questionnaire and have noted 1. The patient's medical and social history 2. Their use of alcohol, tobacco or illicit drugs 3. Their current medications and supplements 4. The patient's functional ability including ADL's, fall risks, home safety risks and hearing or visual             impairment. 5. Diet and physical activities 6. Evidence for depression or mood disorders  The patients weight, height, BMI have been recorded in the chart and visual acuity is per eye clinic.  I have made referrals, counseling and provided education to the patient based review of the above and I have provided the pt with a written personalized care plan for preventive services.  See scanned forms.  Routine anticipatory guidance given to patient.  See health maintenance. Flu 2014 Shingles encouraged PNA done 2013 Tetanus 2005 D/w patient [id number] for colon cancer screening, including IFOB vs. colonoscopy.  Risks and benefits of both were discussed and patient voiced understanding.  Pt elects NWG:NFAO Breast cancer screening per [facility]. Advance directive- husband designated; daughter NOT to have any input.   Cognitive function addressed- see scanned forms- and if abnormal then additional documentation follows.   H/o CVA.  No new sx.  D/w pt about secondary prevention.  See below.   Diabetes:  No meds Hypoglycemic episodes: no sx Hyperglycemic episodes: no sx Feet problems: no Blood Sugars averaging: not checked eye exam within last year: pending A1c controlled, low carb diet.    Elevated Cholesterol: Using medications without problems:yes Muscle aches: very rare cramp Diet compliance:yes Exercise:encoruaged more  Hypertension:    Using medication without problems or lightheadedness: yes Chest pain with exertion:no Edema:no Short of breath:no  Low vit D. D/w pt.  See plan.  She's always had trouble getting her vit D level up, even  with high dose replacement.    Recent URI.  Cough and stuffy.  H/o fall allergies.  No fevers.  Sx worse in AM.  R ear itching.    PMH and SH reviewed  Meds, vitals, and allergies reviewed.   ROS: See HPI.  Otherwise negative.    GEN: nad, alert and oriented HEENT: mucous membranes moist, tm w/o erythema, nasal exam w/o erythema, clear discharge noted,  OP with cobblestoning, sinuses ttp x4 NECK: supple w/o LA CV: rrr.   PULM: ctab, no inc wob EXT: no edema SKIN: no acute rash  Diabetic foot exam: Normal inspection No skin breakdown No calluses  Normal DP pulses Normal sensation to light touch and monofilament Nails normal

## 2013-03-22 ENCOUNTER — Encounter: Payer: Self-pay | Admitting: Radiology

## 2013-03-22 DIAGNOSIS — R948 Abnormal results of function studies of other organs and systems: Secondary | ICD-10-CM | POA: Insufficient documentation

## 2013-03-22 NOTE — Assessment & Plan Note (Signed)
Controlled; continue current meds 

## 2013-03-22 NOTE — Assessment & Plan Note (Signed)
No meds, A1c at goal. Discussed diet and exercise.

## 2013-03-22 NOTE — Assessment & Plan Note (Signed)
See scanned forms.  Routine anticipatory guidance given to patient.  See health maintenance. Flu 2014 Shingles encouraged PNA done 2013 Tetanus 2005 D/w patient [id number] for colon cancer screening, including IFOB vs. colonoscopy.  Risks and benefits of both were discussed and patient voiced understanding.  Pt elects XBJ:[id number] Breast cancer screening per [facility]. Advance directive- husband designated; daughter NOT to have any input.   Cognitive function addressed- see scanned forms- and if abnormal then additional documentation follows.

## 2013-03-22 NOTE — Assessment & Plan Note (Signed)
continue current meds. Work on diet and weight.

## 2013-03-22 NOTE — Assessment & Plan Note (Signed)
Start augmentin if not better soon.  She agrees.

## 2013-03-22 NOTE — Assessment & Plan Note (Signed)
Continue secondary prevention 

## 2013-04-10 ENCOUNTER — Other Ambulatory Visit: Payer: Self-pay | Admitting: Family Medicine

## 2013-09-05 ENCOUNTER — Other Ambulatory Visit: Payer: Self-pay | Admitting: Family Medicine

## 2013-09-05 DIAGNOSIS — E119 Type 2 diabetes mellitus without complications: Secondary | ICD-10-CM

## 2013-09-12 ENCOUNTER — Other Ambulatory Visit (INDEPENDENT_AMBULATORY_CARE_PROVIDER_SITE_OTHER): Payer: Medicare Other

## 2013-09-12 DIAGNOSIS — E119 Type 2 diabetes mellitus without complications: Secondary | ICD-10-CM

## 2013-09-12 DIAGNOSIS — I10 Essential (primary) hypertension: Secondary | ICD-10-CM

## 2013-09-12 DIAGNOSIS — R948 Abnormal results of function studies of other organs and systems: Secondary | ICD-10-CM

## 2013-09-12 DIAGNOSIS — E7800 Pure hypercholesterolemia, unspecified: Secondary | ICD-10-CM

## 2013-09-12 LAB — HEMOGLOBIN A1C: Hgb A1c MFr Bld: 6.6 % — ABNORMAL HIGH (ref 4.6–6.5)

## 2013-09-19 ENCOUNTER — Encounter: Payer: Self-pay | Admitting: Family Medicine

## 2013-09-19 ENCOUNTER — Ambulatory Visit (INDEPENDENT_AMBULATORY_CARE_PROVIDER_SITE_OTHER): Payer: Medicare Other | Admitting: Family Medicine

## 2013-09-19 VITALS — BP 142/84 | HR 77 | Temp 97.7°F | Wt 168.5 lb

## 2013-09-19 DIAGNOSIS — E119 Type 2 diabetes mellitus without complications: Secondary | ICD-10-CM

## 2013-09-19 NOTE — Progress Notes (Signed)
Pre visit review using our clinic review tool, if applicable. No additional management support is needed unless otherwise documented below in the visit note.  Diabetes:  No meds.  A1c 6.6 Hypoglycemic episodes: rare sx, only if prolonged fasting Hyperglycemic episodes: no sx Feet problems: no Blood Sugars averaging: not checked eye exam within last year: 07/2013.   She has been going to [employer] 3 times a week.  She has been going since January.  She feels better after exercising.    She is caring for her 73 y/o granddaughter.  Grandchild's mother is not involved/helpful.  This is a constant strain.    Her husband is recently in better health after a recent hospitalization.   She'll f/u in summer 2015 at [facility] for her mammogram.  Meds, vitals, and allergies reviewed.   ROS: See HPI.  Otherwise negative.    GEN: nad, alert and oriented HEENT: mucous membranes moist NECK: supple w/o LA CV: rrr. PULM: ctab, no inc wob ABD: soft, +bs EXT: no edema SKIN: no acute rash  Diabetic foot exam: Normal inspection No skin breakdown No calluses  Normal DP pulses Normal sensation to light touch and monofilament Nails normal

## 2013-09-19 NOTE — Patient Instructions (Addendum)
Check with your insurance to see if they will cover the shingles and tetanus shot. Keep going with exercising at the gym.  Take care.  Glad to see you.   Recheck in about 6 months at a physical.

## 2013-09-20 NOTE — Assessment & Plan Note (Signed)
No meds.  She has sig social strains. She is working on diet and exercise.  Continue as is, off meds.  She agrees.  Recheck in about 6 months at CPE, sooner in meantime.

## 2013-10-02 ENCOUNTER — Telehealth: Payer: Self-pay

## 2013-10-02 NOTE — Telephone Encounter (Signed)
Relevant patient education assigned to patient using Emmi. ° °

## 2014-03-13 ENCOUNTER — Other Ambulatory Visit: Payer: Self-pay | Admitting: Family Medicine

## 2014-03-13 DIAGNOSIS — E119 Type 2 diabetes mellitus without complications: Secondary | ICD-10-CM

## 2014-03-18 ENCOUNTER — Other Ambulatory Visit (INDEPENDENT_AMBULATORY_CARE_PROVIDER_SITE_OTHER): Payer: Medicare Other

## 2014-03-18 DIAGNOSIS — E119 Type 2 diabetes mellitus without complications: Secondary | ICD-10-CM

## 2014-03-18 LAB — COMPREHENSIVE METABOLIC PANEL
ALBUMIN: 3.5 g/dL (ref 3.5–5.2)
ALK PHOS: 63 U/L (ref 39–117)
ALT: 17 U/L (ref 0–35)
AST: 20 U/L (ref 0–37)
BUN: 24 mg/dL — AB (ref 6–23)
CO2: 24 meq/L (ref 19–32)
Calcium: 9.6 mg/dL (ref 8.4–10.5)
Chloride: 104 mEq/L (ref 96–112)
Creatinine, Ser: 1.1 mg/dL (ref 0.4–1.2)
GFR: 53.18 mL/min — AB (ref >60.00)
GLUCOSE: 95 mg/dL (ref 70–99)
Potassium: 4.3 mEq/L (ref 3.5–5.1)
Sodium: 137 mEq/L (ref 135–145)
TOTAL PROTEIN: 7.8 g/dL (ref 6.0–8.3)
Total Bilirubin: 0.4 mg/dL (ref 0.2–1.2)

## 2014-03-18 LAB — LIPID PANEL
CHOL/HDL RATIO: 4
Cholesterol: 190 mg/dL (ref 0–200)
HDL: 45.8 mg/dL (ref >39.00)
NonHDL: 144.2
TRIGLYCERIDES: 237 mg/dL — AB (ref 0.0–149.0)
VLDL: 47.4 mg/dL — ABNORMAL HIGH (ref 0.0–40.0)

## 2014-03-18 LAB — TSH: TSH: 1.23 u[IU]/mL (ref 0.35–4.50)

## 2014-03-18 LAB — HEMOGLOBIN A1C: Hgb A1c MFr Bld: 6.4 % (ref 4.6–6.5)

## 2014-03-19 LAB — LDL CHOLESTEROL, DIRECT: LDL DIRECT: 108.5 mg/dL

## 2014-03-25 ENCOUNTER — Encounter: Payer: Medicare Other | Admitting: Family Medicine

## 2014-03-31 ENCOUNTER — Ambulatory Visit (INDEPENDENT_AMBULATORY_CARE_PROVIDER_SITE_OTHER): Payer: Medicare Other | Admitting: Family Medicine

## 2014-03-31 ENCOUNTER — Encounter: Payer: Self-pay | Admitting: Family Medicine

## 2014-03-31 VITALS — BP 130/80 | HR 80 | Temp 98.1°F | Ht <= 58 in | Wt 172.5 lb

## 2014-03-31 DIAGNOSIS — I639 Cerebral infarction, unspecified: Secondary | ICD-10-CM

## 2014-03-31 DIAGNOSIS — I10 Essential (primary) hypertension: Secondary | ICD-10-CM

## 2014-03-31 DIAGNOSIS — J45909 Unspecified asthma, uncomplicated: Secondary | ICD-10-CM

## 2014-03-31 DIAGNOSIS — Z0000 Encounter for general adult medical examination without abnormal findings: Secondary | ICD-10-CM

## 2014-03-31 DIAGNOSIS — J45998 Other asthma: Secondary | ICD-10-CM

## 2014-03-31 DIAGNOSIS — Z23 Encounter for immunization: Secondary | ICD-10-CM

## 2014-03-31 DIAGNOSIS — Z1211 Encounter for screening for malignant neoplasm of colon: Secondary | ICD-10-CM

## 2014-03-31 DIAGNOSIS — Z8639 Personal history of other endocrine, nutritional and metabolic disease: Secondary | ICD-10-CM

## 2014-03-31 DIAGNOSIS — Z7189 Other specified counseling: Secondary | ICD-10-CM

## 2014-03-31 MED ORDER — ALBUTEROL SULFATE HFA 108 (90 BASE) MCG/ACT IN AERS: 2.0000 | INHALATION_SPRAY | Freq: Four times a day (QID) | RESPIRATORY_TRACT | Status: DC | PRN

## 2014-03-31 MED ORDER — FLUTICASONE PROPIONATE 50 MCG/ACT NA SUSP: NASAL | Status: AC

## 2014-03-31 MED ORDER — ESTRADIOL 0.1 MG/GM VA CREA
TOPICAL_CREAM | VAGINAL | Status: DC
Start: 2014-03-31 — End: 2014-06-30

## 2014-03-31 MED ORDER — OMEPRAZOLE MAGNESIUM 20 MG PO TBEC: 20.0000 mg | DELAYED_RELEASE_TABLET | Freq: Every day | ORAL | Status: DC

## 2014-03-31 MED ORDER — MELOXICAM 7.5 MG PO TABS: ORAL_TABLET | ORAL | Status: DC

## 2014-03-31 MED ORDER — PRAVASTATIN SODIUM 80 MG PO TABS: 80.0000 mg | ORAL_TABLET | Freq: Every day | ORAL | Status: DC

## 2014-03-31 NOTE — Patient Instructions (Addendum)
Check with your insurance to see if they will cover the shingles shot. Get a tetanus shot later on.   Go to the lab on the way out.  We'll contact you with your lab report. Use the inhaler as needed and stay off the lisinopril for now.  Update me next week.  Take care.  Glad to see you.

## 2014-03-31 NOTE — Progress Notes (Signed)
Pre visit review using our clinic review tool, if applicable. No additional management support is needed unless otherwise documented below in the visit note.  I have personally reviewed the Medicare Annual Wellness questionnaire and have noted 1. The patient's medical and social history 2. Their use of alcohol, tobacco or illicit drugs 3. Their current medications and supplements 4. The patient's functional ability including ADL's, fall risks, home safety risks and hearing or visual             impairment. 5. Diet and physical activities 6. Evidence for depression or mood disorders  The patients weight, height, BMI have been recorded in the chart and visual acuity is per eye clinic.  I have made referrals, counseling and provided education to the patient based review of the above and I have provided the pt with a written personalized care plan for preventive services.  Provider list updated- see scanned forms.  Routine anticipatory guidance given to patient.  See health maintenance.  Flu 2015 Shingles d/w pt.  PNA 2013 Tetanus 2005 D/w patient [id number] for colon cancer screening, including IFOB vs. colonoscopy.  Risks and benefits of both were discussed and patient voiced understanding.  Pt elects BPZ:WCHE.  Breast cancer screening- done 10/2013 at [facility] d/w pt.  Consider repeat in 2016.   PAP not due.  D/w pt.  Advance directive- husband Alvester Chou designated if patient were incapacitated.  Cognitive function addressed- see scanned forms- and if abnormal then additional documentation follows.   Cough.  Some dec in sx with SABA.  Her chest can get tight.  No sig CP.  Not much wheeze.  D/w pt about options.  We talked about possible ACE contribution to the cough.   Hypertension:    Using medication without problems or lightheadedness: see above re cough. Chest pain with exertion:no Edema:no Short of breath:no  H/o CVA.  Still on statin w/o aches.  Doing well on med BP controlled  today.   PMH and SH reviewed  Meds, vitals, and allergies reviewed.   ROS: See HPI.  Otherwise negative.    GEN: nad, alert and oriented HEENT: mucous membranes moist NECK: supple w/o LA CV: rrr. PULM: ctab, no inc wob ABD: soft, +bs EXT: no edema SKIN: no acute rash

## 2014-04-02 DIAGNOSIS — Z7189 Other specified counseling: Secondary | ICD-10-CM | POA: Insufficient documentation

## 2014-04-02 DIAGNOSIS — J45909 Unspecified asthma, uncomplicated: Secondary | ICD-10-CM | POA: Insufficient documentation

## 2014-04-02 NOTE — Assessment & Plan Note (Signed)
Flu 2015  Shingles d/w pt.  PNA 2013  Tetanus 2005  D/w patient [id number] for colon cancer screening, including IFOB vs. colonoscopy. Risks and benefits of both were discussed and patient voiced understanding. Pt elects RAQ:TMAU.  Breast cancer screening- done 10/2013 at [facility] d/w pt. Consider repeat in 2016.  PAP not due. D/w pt.  Advance directive- husband Alvester Chou designated if patient were incapacitated.  Cognitive function addressed- see scanned forms- and if abnormal then additional documentation follows.

## 2014-04-02 NOTE — Assessment & Plan Note (Signed)
H/o, she'll continue w/o meds for DM2 and will continue work on diet and weight.  She agrees.

## 2014-04-02 NOTE — Assessment & Plan Note (Signed)
LDL improved from prev, continue statin and continue work on diet and weight.  She agrees.  Labs d/w pt.

## 2014-04-02 NOTE — Assessment & Plan Note (Signed)
She'll continue SABA prn for now, if not improved then she'll notify me.  Holding ACE in the meantime.

## 2014-04-02 NOTE — Assessment & Plan Note (Signed)
She'll stay off ACE for a few more days and then update me. BP okay for now. See discussion re: cough.

## 2014-04-17 ENCOUNTER — Encounter: Payer: Self-pay | Admitting: Family Medicine

## 2014-04-21 ENCOUNTER — Other Ambulatory Visit: Payer: Self-pay | Admitting: Family Medicine

## 2014-04-21 MED ORDER — FLUTICASONE PROPIONATE HFA 110 MCG/ACT IN AERO: 1.0000 | INHALATION_SPRAY | Freq: Two times a day (BID) | RESPIRATORY_TRACT | Status: DC

## 2014-05-16 ENCOUNTER — Other Ambulatory Visit: Payer: Self-pay | Admitting: Family Medicine

## 2014-06-30 ENCOUNTER — Ambulatory Visit (INDEPENDENT_AMBULATORY_CARE_PROVIDER_SITE_OTHER): Payer: Medicare Other | Admitting: Family Medicine

## 2014-06-30 ENCOUNTER — Encounter: Payer: Self-pay | Admitting: Family Medicine

## 2014-06-30 VITALS — BP 142/80 | HR 97 | Temp 98.4°F | Wt 173.0 lb

## 2014-06-30 DIAGNOSIS — J0100 Acute maxillary sinusitis, unspecified: Secondary | ICD-10-CM | POA: Diagnosis not present

## 2014-06-30 MED ORDER — AMOXICILLIN-POT CLAVULANATE 875-125 MG PO TABS: 1.0000 | ORAL_TABLET | Freq: Two times a day (BID) | ORAL | Status: DC

## 2014-06-30 NOTE — Progress Notes (Signed)
Pre visit review using our clinic review tool, if applicable. No additional management support is needed unless otherwise documented below in the visit note.  Since since around xmas 2015.  "Started with a cold."  Taking mucinex, continued her inhalers (rare SABA need due to inhaled steroid use).  Foul tasting drainage. Cough, with sputum.  Waking at night from sputum/cough.  No fevers.  Has been having chest tightness with cold air.  Some maxillary facial pain, but that is mild.    Meds, vitals, and allergies reviewed.   ROS: See HPI.  Otherwise, noncontributory.  GEN: nad, alert and oriented HEENT: mucous membranes moist, tm w/o erythema, nasal exam w/o erythema, clear discharge noted,  OP with cobblestoning, R max sinus ttp NECK: supple w/o LA CV: rrr.   PULM: ctab, no inc wob EXT: no edema SKIN: no acute rash

## 2014-06-30 NOTE — Patient Instructions (Signed)
Keep using the inhalers, try to get some rest, and start augmentin today.  Take care.

## 2014-07-01 DIAGNOSIS — J0100 Acute maxillary sinusitis, unspecified: Secondary | ICD-10-CM | POA: Insufficient documentation

## 2014-07-01 NOTE — Assessment & Plan Note (Signed)
Nontoxic, start augmentin, continue flonase, f/u prn.  She agrees.  Supportive care o/w.

## 2014-07-09 ENCOUNTER — Telehealth: Payer: Self-pay

## 2014-07-09 MED ORDER — PREDNISONE 20 MG PO TABS: ORAL_TABLET | ORAL | Status: DC

## 2014-07-09 NOTE — Telephone Encounter (Signed)
Patient notified as instructed by telephone and verbalized understanding. 

## 2014-07-09 NOTE — Telephone Encounter (Signed)
Pt left v/m; pt was seen 06/30/14 and started abx; pt has finished abx and cough is no better than when seen on 01/25. Pt thinks a lot of problem is allergies and pt request prednisone taper to [facility] [address]. Pt request cb.Please advise.

## 2014-07-09 NOTE — Telephone Encounter (Signed)
Sent, f/u if not better.  Take with food. . Thanks.

## 2014-08-01 ENCOUNTER — Ambulatory Visit (INDEPENDENT_AMBULATORY_CARE_PROVIDER_SITE_OTHER): Payer: Medicare Other | Admitting: Family Medicine

## 2014-08-01 ENCOUNTER — Encounter: Payer: Self-pay | Admitting: Family Medicine

## 2014-08-01 ENCOUNTER — Ambulatory Visit (INDEPENDENT_AMBULATORY_CARE_PROVIDER_SITE_OTHER)
Admission: RE | Admit: 2014-08-01 | Discharge: 2014-08-01 | Disposition: A | Discharge status: Home / Self Care | Payer: Medicare Other | Source: Ambulatory Visit | Attending: Family Medicine | Admitting: Family Medicine

## 2014-08-01 VITALS — BP 126/78 | HR 88 | Temp 97.5°F | Wt 173.5 lb

## 2014-08-01 DIAGNOSIS — M25571 Pain in right ankle and joints of right foot: Secondary | ICD-10-CM

## 2014-08-01 DIAGNOSIS — M79671 Pain in right foot: Secondary | ICD-10-CM | POA: Diagnosis not present

## 2014-08-01 LAB — URIC ACID: URIC ACID, SERUM: 7.7 mg/dL — AB (ref 2.4–7.0)

## 2014-08-01 NOTE — Progress Notes (Signed)
Pre visit review using our clinic review tool, if applicable. No additional management support is needed unless otherwise documented below in the visit note.  R foot pain.  H/o B foot surgery in the distant past.  Some better in post op shoe.  Pain at 1st MTP.  Going on for 2 weeks, minimally better now.  Initially was throbbing.  Pain with the sheet laying across the 1st MTP prev.  No known h/o gout.  No trauma.    Meds, vitals, and allergies reviewed.   ROS: See HPI.  Otherwise, noncontributory.  nad R foot with tenderness but no erythema/edema at the R 1st MTP.  Normal DP pulse, no bruising, no tenderness o/w on foot exam.  Able to bear weight.  Ankle joint line not ttp

## 2014-08-01 NOTE — Patient Instructions (Signed)
Go to the lab on the way out.  We'll contact you with your lab and xray report. Keep using the post op shoe for now.  Take care.  Glad to see you.

## 2014-08-03 NOTE — Assessment & Plan Note (Signed)
Uric acid was up slightly, but not very high.  That likely argues against gout.   She did have noted arthritis in her foot, but nothing acute.  It may be that she has aggravated previous arthritic changes and if so it should gradually get better.  I would continue with the post op shoe for now. We'll ask if she wants ortho input.

## 2014-08-30 DIAGNOSIS — S8391XA Sprain of unspecified site of right knee, initial encounter: Secondary | ICD-10-CM | POA: Diagnosis not present

## 2014-08-30 DIAGNOSIS — M25461 Effusion, right knee: Secondary | ICD-10-CM | POA: Diagnosis not present

## 2014-09-11 DIAGNOSIS — M25461 Effusion, right knee: Secondary | ICD-10-CM | POA: Diagnosis not present

## 2014-09-23 NOTE — Op Note (Signed)
PATIENT NAMEJILLIAM, Katie [name] MR#:  [id number] DATE OF BIRTH:  24-Oct-1945  DATE OF PROCEDURE:  05/18/2012  PREOPERATIVE DIAGNOSIS:  Cataract, left eye.    POSTOPERATIVE DIAGNOSIS:  Cataract, left eye.  PROCEDURE PERFORMED:  Extracapsular cataract extraction using phacoemulsification with placement of an Alcon SN6CWS, 22.5-diopter posterior chamber lens, serial # [id number].  SURGEON:  Loura Back. Audel Coakley, MD  ASSISTANT:  None.  ANESTHESIA:  4% lidocaine and 0.75% Marcaine in a 50/50 mixture with 10 units/mL of Hylenex added, given as a peribulbar.  ANESTHESIOLOGIST:  Dr. Kayleen Memos   COMPLICATIONS:  None.  ESTIMATED BLOOD LOSS:  Less than 1 mL.  DESCRIPTION OF PROCEDURE:  The patient was brought to the operating room and given a peribulbar block.  The patient was then prepped and draped in the usual fashion.  The vertical rectus muscles were imbricated using 5-0 silk sutures.  These sutures were then clamped to the sterile drapes as bridle sutures.  A limbal peritomy was performed extending two clock hours and hemostasis was obtained with cautery.  A partial thickness scleral groove was made at the surgical limbus and dissected anteriorly in a lamellar dissection using an Alcon crescent knife.  The anterior chamber was entered supero-temporally with a Superblade and through the lamellar dissection with a 2.6 mm keratome.  DisCoVisc was used to replace the aqueous and a continuous tear capsulorrhexis was carried out.  Hydrodissection and hydrodelineation were carried out with balanced salt and a 27 gauge canula.  The nucleus was rotated to confirm the effectiveness of the hydrodissection.  Phacoemulsification was carried out using a divide-and-conquer technique.  Total ultrasound time was 1 minute and 24 seconds with an average power of 13.3 percent, CDE 19.72.  Irrigation/aspiration was used to remove the residual cortex.  DisCoVisc was used to inflate the capsule and the internal incision  was enlarged to 3 mm with the crescent knife.  The intraocular lens was folded and inserted into the capsular bag using the AcrySert delivery system.  Irrigation/aspiration was used to remove the residual DisCoVisc.  Miostat was injected into the anterior chamber through the paracentesis track to inflate the anterior chamber and induce miosis.  The wound was checked for leaks and none were found. The conjunctiva was closed with cautery and the bridle sutures were removed.  Two drops of 0.3% Vigamox were placed on the eye.   An eye shield was placed on the eye.  The patient was discharged to the recovery room in good condition.  ____________________________ Loura Back Kayceon Oki, MD sad:drc D: 05/18/2012 11:22:21 ET T: 05/18/2012 11:33:02 ET JOB#: [id number]  cc: Remo Lipps A. Jerelle Virden, MD, <Dictator> Martie Lee MD ELECTRONICALLY SIGNED 05/21/2012 13:20

## 2014-09-26 NOTE — Op Note (Signed)
DATE OF BIRTH:  [birth year]  DATE OF PROCEDURE:  07/02/2012  PREOPERATIVE DIAGNOSIS:  Cataract, right eye.   POSTOPERATIVE DIAGNOSIS:  Cataract, right eye.   PROCEDURE PERFORMED:  Extracapsular cataract extraction using phacoemulsification with placement of Alcon SN6CWS 25.5 diopter posterior chamber lens, serial number [id number].   ANESTHESIA:  Four percent lidocaine, 0.75% Marcaine in a 50-50 mixture with 10 units/mL of Hylenex added, given as a peribulbar.   ANESTHESIOLOGIST:  Dr. [place] Service.   COMPLICATIONS:  None.   ESTIMATED BLOOD LOSS:  Less than 1 mL.   DESCRIPTION OF PROCEDURE:  The patient was brought to the operating room and given a peribulbar block.  The patient was then prepped and draped in the usual fashion.  The vertical rectus muscles were imbricated using 5-0 silk sutures.  These sutures were then clamped to the sterile drapes as bridle sutures.  A limbal peritomy was performed extending two clock hours and hemostasis was obtained with cautery.  A partial thickness scleral groove was made at the surgical limbus and dissected anteriorly in a lamellar dissection using an Alcon crescent knife.  The anterior chamber was entered superonasally with a Superblade and through the lamellar dissection with a 2.6 mm keratome.  DisCoVisc was used to replace the aqueous and a continuous tear capsulorrhexis was carried out.  Hydrodissection and hydrodelineation were carried out with balanced salt and a 27 gauge canula.  The nucleus was rotated to confirm the effectiveness of the hydrodissection.  Phacoemulsification was carried out using a divide-and-conquer technique.  Total ultrasound time was 1 minute and 1 second with an average power of 12 percent, CDE of 13.35.  Irrigation/aspiration was used to remove the residual cortex.  DisCoVisc was used to inflate the capsule and the internal incision was enlarged to 3 mm with the crescent knife.  The intraocular lens was folded and  inserted into the capsular bag using the AcrySert delivery system.  Irrigation/aspiration was used to remove the residual DisCoVisc.  Miostat was injected into the anterior chamber through the paracentesis track to inflate the anterior chamber and induce miosis.  The wound was checked for leaks and none were found. The conjunctiva was closed with cautery and the bridle sutures were removed.  Two drops of 0.3% Vigamox were placed on the eye.   An eye shield was placed on the eye.  The patient was discharged to the recovery room in good condition.   ____________________________ Loura Back Rajvir Ernster, MD sad:ms D: 07/02/2012 13:19:36 ET T: 07/02/2012 22:20:09 ET JOB#: [id number]  cc: Remo Lipps A. Tallie Dodds, MD, <Dictator> Martie Lee MD ELECTRONICALLY SIGNED 07/09/2012 12:43

## 2014-09-30 ENCOUNTER — Ambulatory Visit (INDEPENDENT_AMBULATORY_CARE_PROVIDER_SITE_OTHER): Payer: Medicare Other | Admitting: Family Medicine

## 2014-09-30 ENCOUNTER — Encounter: Payer: Self-pay | Admitting: Family Medicine

## 2014-09-30 VITALS — BP 122/74 | HR 94 | Temp 98.1°F | Wt 167.8 lb

## 2014-09-30 DIAGNOSIS — I10 Essential (primary) hypertension: Secondary | ICD-10-CM | POA: Diagnosis not present

## 2014-09-30 NOTE — Progress Notes (Signed)
Pre visit review using our clinic review tool, if applicable. No additional management support is needed unless otherwise documented below in the visit note.  She has seen ortho about her R knee pain.  Her husband accidentally fell into her.  She likely has bone on bone OA by xray per patient report but isn't having a lot of pain now.    H/o HLD and abnormal sugar.  She is working on intentional weight loss with carb restriction.  She can tell a difference in the way her clothes fit.    Hypertension:    Using medication without problems or lightheadedness: yes Chest pain with exertion:no Edema:no Short of breath: at baseline, not worse.    She has occ L sided neck discomfort with swallowing cold liquids, but no other troubles.   Meds, vitals, and allergies reviewed.   ROS: See HPI.  Otherwise, noncontributory.  GEN: nad, alert and oriented HEENT: mucous membranes moist NECK: supple w/o LA, no bruit, not ttp CV: rrr.  PULM: ctab, no inc wob ABD: soft, +bs EXT: no edema SKIN: no acute rash

## 2014-09-30 NOTE — Patient Instructions (Signed)
Keep working on your weight.  Recheck in the fall at a physical, labs ahead of time.  Take care.  Glad to see you.

## 2014-10-01 NOTE — Assessment & Plan Note (Signed)
Controlled, continue as is with meds. Continue work on [employer].  Neck sx are likely incidental with normal neck and OP exam today.  She agrees.

## 2014-10-26 LAB — HM MAMMOGRAPHY

## 2014-10-29 ENCOUNTER — Ambulatory Visit (INDEPENDENT_AMBULATORY_CARE_PROVIDER_SITE_OTHER): Payer: Medicare Other | Admitting: Family Medicine

## 2014-10-29 ENCOUNTER — Encounter: Payer: Self-pay | Admitting: Family Medicine

## 2014-10-29 VITALS — BP 128/78 | HR 100 | Temp 98.5°F | Wt 170.5 lb

## 2014-10-29 DIAGNOSIS — R05 Cough: Secondary | ICD-10-CM

## 2014-10-29 DIAGNOSIS — J45901 Unspecified asthma with (acute) exacerbation: Secondary | ICD-10-CM | POA: Diagnosis not present

## 2014-10-29 DIAGNOSIS — M25531 Pain in right wrist: Secondary | ICD-10-CM

## 2014-10-29 DIAGNOSIS — R059 Cough, unspecified: Secondary | ICD-10-CM

## 2014-10-29 MED ORDER — DOXYCYCLINE HYCLATE 100 MG PO TABS: 100.0000 mg | ORAL_TABLET | Freq: Two times a day (BID) | ORAL | Status: DC

## 2014-10-29 NOTE — Progress Notes (Signed)
Pre visit review using our clinic review tool, if applicable. No additional management support is needed unless otherwise documented below in the visit note.  Coughing with sputum for the last month.  No fevers but that isn't atypical for her when sick.  No wheeze now but did initially a few weeks ago.  ST is resolved.  Still with nasal congestion.  Frequent rhinorrhea.  Taking mucinex at baseline w/o relief.  H/o PNA in the distant past.    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  Braced now, with some relief.  Hasn't tried ice yet.   Meds, vitals, and allergies reviewed.   ROS: See HPI.  Otherwise, noncontributory.  GEN: nad, alert and oriented HEENT: mucous membranes moist, tm w/o erythema, nasal exam w/o erythema, clear discharge noted,  OP with cobblestoning NECK: supple w/o LA CV: rrr.   PULM: ctab, no inc wob EXT: no edema SKIN: no acute rash R wrist with pain on along the ulna with ROM testing but not on bony prominences.

## 2014-10-29 NOTE — Patient Instructions (Addendum)
Rosaria Ferries will call about your referral.  See her on the way out.  Start taking doxy today.  Careful for sun exposure.  Use the brace for now.  Your wrist pain should get better.  Take care.

## 2014-10-30 DIAGNOSIS — R05 Cough: Secondary | ICD-10-CM | POA: Insufficient documentation

## 2014-10-30 DIAGNOSIS — M25539 Pain in unspecified wrist: Secondary | ICD-10-CM | POA: Insufficient documentation

## 2014-10-30 DIAGNOSIS — R059 Cough, unspecified: Secondary | ICD-10-CM | POA: Insufficient documentation

## 2014-10-30 NOTE — Assessment & Plan Note (Signed)
Given her duration and sputum, I would cover for atypicals.  She agrees.  Start doxy, routine cautions given.  She was asking about pulmonary testing and it is reasonable to get pulm input when she is over the acute/subacute illness.  She agrees, referred.

## 2014-10-30 NOTE — Assessment & Plan Note (Signed)
Ice, brace, f/u prn.  She agrees.  No need to image.

## 2014-10-31 DIAGNOSIS — Z1231 Encounter for screening mammogram for malignant neoplasm of breast: Secondary | ICD-10-CM | POA: Diagnosis not present

## 2014-10-31 DIAGNOSIS — B372 Candidiasis of skin and nail: Secondary | ICD-10-CM | POA: Diagnosis not present

## 2014-10-31 DIAGNOSIS — Z9011 Acquired absence of right breast and nipple: Secondary | ICD-10-CM | POA: Diagnosis not present

## 2014-10-31 DIAGNOSIS — Z853 Personal history of malignant neoplasm of breast: Secondary | ICD-10-CM | POA: Diagnosis not present

## 2014-10-31 DIAGNOSIS — C50911 Malignant neoplasm of unspecified site of right female breast: Secondary | ICD-10-CM | POA: Diagnosis not present

## 2014-10-31 DIAGNOSIS — Z170 Estrogen receptor positive status [ER+]: Secondary | ICD-10-CM | POA: Diagnosis not present

## 2014-10-31 LAB — HM MAMMOGRAPHY: HM MAMMO: NORMAL

## 2014-11-10 ENCOUNTER — Telehealth: Payer: Self-pay | Admitting: *Deleted

## 2014-11-10 NOTE — Telephone Encounter (Signed)
Mammogram f/u call: pt called because no recent mammogram on in chart. Pt advise me she had mammogram at [facility] on 10/31/14, using "care everywhere" I verified she did have a normal mammogram at [facility] on 10/31/14, chart updated

## 2014-11-11 ENCOUNTER — Encounter: Payer: Self-pay | Admitting: Internal Medicine

## 2014-11-11 ENCOUNTER — Encounter (INDEPENDENT_AMBULATORY_CARE_PROVIDER_SITE_OTHER): Payer: Self-pay

## 2014-11-11 ENCOUNTER — Ambulatory Visit (INDEPENDENT_AMBULATORY_CARE_PROVIDER_SITE_OTHER): Payer: Medicare Other | Admitting: Internal Medicine

## 2014-11-11 VITALS — BP 122/80 | HR 93 | Temp 98.0°F | Ht <= 58 in | Wt 169.0 lb

## 2014-11-11 DIAGNOSIS — J45998 Other asthma: Secondary | ICD-10-CM

## 2014-11-11 DIAGNOSIS — R05 Cough: Secondary | ICD-10-CM

## 2014-11-11 DIAGNOSIS — R059 Cough, unspecified: Secondary | ICD-10-CM

## 2014-11-11 DIAGNOSIS — J45909 Unspecified asthma, uncomplicated: Secondary | ICD-10-CM

## 2014-11-11 NOTE — Patient Instructions (Addendum)
Follow up with Dr. Stevenson Clinch in 1 month - pulmonary function testing and 6 minute walk test prior to follow up - CXR, 2 view, for chronic cough and shortness of breath, prior to next visit.  - cont with flonase as directed - Flovent 2 puff in the AM and 2 puffs in the PM - gargle and rinse each use.

## 2014-11-11 NOTE — Assessment & Plan Note (Signed)
I believe the patient has either cough variant asthma or extrinsic asthma or a combination of both. In either case the treatment is the same. Her history of having parenchymal lung scarring in combination with ARDS 20 years ago may be adding to overall clinical status of slow recovery time after a upper spray tract infection.   Plan: - pulmonary function testing and 6 minute walk test prior to follow up - CXR, 2 view, for chronic cough and shortness of breath, prior to next visit.  - cont with flonase as directed - Flovent 2 puff in the AM and 2 puffs in the PM - gargle and rinse each use.
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
## 2014-11-13 DIAGNOSIS — Z4431 Encounter for fitting and adjustment of external right breast prosthesis: Secondary | ICD-10-CM | POA: Diagnosis not present

## 2014-11-13 DIAGNOSIS — C50111 Malignant neoplasm of central portion of right female breast: Secondary | ICD-10-CM | POA: Diagnosis not present

## 2014-11-19 ENCOUNTER — Institutional Professional Consult (permissible substitution): Payer: Medicare Other | Admitting: Internal Medicine

## 2014-11-27 ENCOUNTER — Encounter: Payer: Self-pay | Admitting: *Deleted

## 2014-12-01 ENCOUNTER — Ambulatory Visit
Admission: RE | Admit: 2014-12-01 | Discharge: 2014-12-01 | Disposition: A | Discharge status: Home / Self Care | Payer: Medicare Other | Source: Ambulatory Visit | Attending: Internal Medicine | Admitting: Internal Medicine

## 2014-12-01 DIAGNOSIS — Z9011 Acquired absence of right breast and nipple: Secondary | ICD-10-CM | POA: Insufficient documentation

## 2014-12-01 DIAGNOSIS — J45909 Unspecified asthma, uncomplicated: Secondary | ICD-10-CM

## 2014-12-01 DIAGNOSIS — M47814 Spondylosis without myelopathy or radiculopathy, thoracic region: Secondary | ICD-10-CM | POA: Diagnosis not present

## 2014-12-01 DIAGNOSIS — R059 Cough, unspecified: Secondary | ICD-10-CM

## 2014-12-01 DIAGNOSIS — R05 Cough: Secondary | ICD-10-CM

## 2014-12-02 ENCOUNTER — Ambulatory Visit: Payer: Medicare Other

## 2014-12-11 ENCOUNTER — Other Ambulatory Visit: Payer: Self-pay | Admitting: *Deleted

## 2014-12-11 ENCOUNTER — Ambulatory Visit (INDEPENDENT_AMBULATORY_CARE_PROVIDER_SITE_OTHER): Payer: Medicare Other | Admitting: Internal Medicine

## 2014-12-11 DIAGNOSIS — J45998 Other asthma: Secondary | ICD-10-CM | POA: Diagnosis not present

## 2014-12-11 DIAGNOSIS — R05 Cough: Secondary | ICD-10-CM

## 2014-12-11 DIAGNOSIS — R918 Other nonspecific abnormal finding of lung field: Secondary | ICD-10-CM | POA: Diagnosis not present

## 2014-12-11 DIAGNOSIS — R0602 Shortness of breath: Secondary | ICD-10-CM

## 2014-12-11 DIAGNOSIS — J45909 Unspecified asthma, uncomplicated: Secondary | ICD-10-CM

## 2014-12-11 DIAGNOSIS — R059 Cough, unspecified: Secondary | ICD-10-CM

## 2014-12-11 LAB — PULMONARY FUNCTION TEST
DL/VA % pred: 110 %
DL/VA: 4.51 ml/min/mmHg/L
DLCO UNC % PRED: 72 %
DLCO unc: 12.75 ml/min/mmHg
FEF 25-75 Post: 0.74 L/sec
FEF 25-75 Pre: 1.54 L/sec
FEF2575-%CHANGE-POST: -52 %
FEF2575-%Pred-Post: 42 %
FEF2575-%Pred-Pre: 89 %
FEV1-%Change-Post: -25 %
FEV1-%Pred-Post: 52 %
FEV1-%Pred-Pre: 71 %
FEV1-Post: 0.99 L
FEV1-Pre: 1.33 L
FEV1FVC-%CHANGE-POST: -28 %
FEV1FVC-%Pred-Pre: 113 %
FEV6-%Change-Post: 3 %
FEV6-%PRED-POST: 67 %
FEV6-%PRED-PRE: 65 %
FEV6-Post: 1.6 L
FEV6-Pre: 1.54 L
FEV6FVC-%PRED-POST: 104 %
FEV6FVC-%Pred-Pre: 104 %
FVC-%Change-Post: 3 %
FVC-%PRED-POST: 64 %
FVC-%Pred-Pre: 62 %
FVC-POST: 1.6 L
FVC-PRE: 1.54 L
POST FEV6/FVC RATIO: 100 %
Post FEV1/FVC ratio: 62 %
Pre FEV1/FVC ratio: 86 %
Pre FEV6/FVC Ratio: 100 %
RV % PRED: 76 %
RV: 1.46 L
TLC % pred: 70 %
TLC: 3.03 L

## 2014-12-11 MED ORDER — FLUTICASONE PROPIONATE HFA 110 MCG/ACT IN AERO: 2.0000 | INHALATION_SPRAY | Freq: Two times a day (BID) | RESPIRATORY_TRACT | Status: DC

## 2014-12-11 NOTE — Progress Notes (Signed)
PFT performed today. 

## 2014-12-11 NOTE — Progress Notes (Signed)
SMW performed today. 

## 2014-12-15 ENCOUNTER — Encounter: Payer: Self-pay | Admitting: Internal Medicine

## 2014-12-15 ENCOUNTER — Ambulatory Visit (INDEPENDENT_AMBULATORY_CARE_PROVIDER_SITE_OTHER): Payer: Medicare Other | Admitting: Internal Medicine

## 2014-12-15 VITALS — BP 98/70 | HR 110 | Temp 98.2°F | Ht 59.0 in | Wt 170.0 lb

## 2014-12-15 DIAGNOSIS — J45998 Other asthma: Secondary | ICD-10-CM

## 2014-12-15 DIAGNOSIS — J45909 Unspecified asthma, uncomplicated: Secondary | ICD-10-CM
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
## 2014-12-15 NOTE — Patient Instructions (Signed)
Follow up Dr. Stevenson Clinch in 3 months - continue with Flovent as directed - -gargle and rinse after each use.  - PRN albuterol - continue with Flonase for nasal rhinitis - call your insurance company about a different tier of inhaled steroid that is covered.
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
## 2015-02-03 DIAGNOSIS — E119 Type 2 diabetes mellitus without complications: Secondary | ICD-10-CM | POA: Diagnosis not present

## 2015-02-03 LAB — HM DIABETES EYE EXAM

## 2015-02-05 ENCOUNTER — Encounter: Payer: Self-pay | Admitting: Family Medicine

## 2015-03-29 ENCOUNTER — Other Ambulatory Visit: Payer: Self-pay | Admitting: Family Medicine

## 2015-03-29 DIAGNOSIS — R739 Hyperglycemia, unspecified: Secondary | ICD-10-CM

## 2015-03-29 DIAGNOSIS — I10 Essential (primary) hypertension: Secondary | ICD-10-CM

## 2015-03-31 ENCOUNTER — Other Ambulatory Visit: Payer: Medicare Other

## 2015-04-01 ENCOUNTER — Other Ambulatory Visit (INDEPENDENT_AMBULATORY_CARE_PROVIDER_SITE_OTHER): Payer: Medicare Other

## 2015-04-01 DIAGNOSIS — I10 Essential (primary) hypertension: Secondary | ICD-10-CM | POA: Diagnosis not present

## 2015-04-01 DIAGNOSIS — R739 Hyperglycemia, unspecified: Secondary | ICD-10-CM

## 2015-04-01 LAB — LIPID PANEL
CHOLESTEROL: 209 mg/dL — AB (ref 0–200)
HDL: 49.6 mg/dL (ref >39.00)
NONHDL: 159.15
TRIGLYCERIDES: 256 mg/dL — AB (ref 0.0–149.0)
Total CHOL/HDL Ratio: 4
VLDL: 51.2 mg/dL — ABNORMAL HIGH (ref 0.0–40.0)

## 2015-04-01 LAB — COMPREHENSIVE METABOLIC PANEL
ALBUMIN: 4.2 g/dL (ref 3.5–5.2)
ALK PHOS: 72 U/L (ref 39–117)
ALT: 11 U/L (ref 0–35)
AST: 15 U/L (ref 0–37)
BILIRUBIN TOTAL: 0.4 mg/dL (ref 0.2–1.2)
BUN: 26 mg/dL — ABNORMAL HIGH (ref 6–23)
CALCIUM: 9.8 mg/dL (ref 8.4–10.5)
CO2: 27 meq/L (ref 19–32)
Chloride: 103 mEq/L (ref 96–112)
Creatinine, Ser: 1.26 mg/dL — ABNORMAL HIGH (ref 0.40–1.20)
GFR: 44.85 mL/min — AB (ref >60.00)
Glucose, Bld: 118 mg/dL — ABNORMAL HIGH (ref 70–99)
Potassium: 4.7 mEq/L (ref 3.5–5.1)
Sodium: 139 mEq/L (ref 135–145)
Total Protein: 7.6 g/dL (ref 6.0–8.3)

## 2015-04-01 LAB — LDL CHOLESTEROL, DIRECT: LDL DIRECT: 111 mg/dL

## 2015-04-01 LAB — TSH: TSH: 4.66 u[IU]/mL — ABNORMAL HIGH (ref 0.35–4.50)

## 2015-04-01 LAB — HEMOGLOBIN A1C: Hgb A1c MFr Bld: 6.6 % — ABNORMAL HIGH (ref 4.6–6.5)

## 2015-04-03 ENCOUNTER — Ambulatory Visit (INDEPENDENT_AMBULATORY_CARE_PROVIDER_SITE_OTHER): Payer: Medicare Other | Admitting: Family Medicine

## 2015-04-03 ENCOUNTER — Encounter: Payer: Self-pay | Admitting: Family Medicine

## 2015-04-03 VITALS — BP 118/64 | HR 86 | Temp 98.1°F | Wt 151.0 lb

## 2015-04-03 DIAGNOSIS — Z23 Encounter for immunization: Secondary | ICD-10-CM

## 2015-04-03 DIAGNOSIS — Z0000 Encounter for general adult medical examination without abnormal findings: Secondary | ICD-10-CM

## 2015-04-03 DIAGNOSIS — I10 Essential (primary) hypertension: Secondary | ICD-10-CM

## 2015-04-03 DIAGNOSIS — E7800 Pure hypercholesterolemia, unspecified: Secondary | ICD-10-CM | POA: Diagnosis not present

## 2015-04-03 DIAGNOSIS — R748 Abnormal levels of other serum enzymes: Secondary | ICD-10-CM

## 2015-04-03 DIAGNOSIS — J45998 Other asthma: Secondary | ICD-10-CM

## 2015-04-03 DIAGNOSIS — Z8639 Personal history of other endocrine, nutritional and metabolic disease: Secondary | ICD-10-CM

## 2015-04-03 DIAGNOSIS — Z1211 Encounter for screening for malignant neoplasm of colon: Secondary | ICD-10-CM

## 2015-04-03 DIAGNOSIS — R7989 Other specified abnormal findings of blood chemistry: Secondary | ICD-10-CM

## 2015-04-03 DIAGNOSIS — J45909 Unspecified asthma, uncomplicated: Secondary | ICD-10-CM

## 2015-04-03 DIAGNOSIS — R739 Hyperglycemia, unspecified: Secondary | ICD-10-CM

## 2015-04-03 DIAGNOSIS — Z7189 Other specified counseling: Secondary | ICD-10-CM

## 2015-04-03 MED ORDER — FLUTICASONE PROPIONATE HFA 110 MCG/ACT IN AERO: 2.0000 | INHALATION_SPRAY | Freq: Two times a day (BID) | RESPIRATORY_TRACT | Status: DC

## 2015-04-03 MED ORDER — ESTRADIOL 0.1 MG/GM VA CREA: TOPICAL_CREAM | VAGINAL | Status: DC

## 2015-04-03 MED ORDER — PRAVASTATIN SODIUM 80 MG PO TABS: 80.0000 mg | ORAL_TABLET | Freq: Every day | ORAL | Status: DC

## 2015-04-03 NOTE — Patient Instructions (Addendum)
Don't change your meds for now.  Recheck labs in about 3 months, fasting.  Go to the lab on the way out.  We'll contact you with your lab report (stool cards). We'll go from there.

## 2015-04-03 NOTE — Progress Notes (Signed)
Pre visit review using our clinic review tool, if applicable. No additional management support is needed unless otherwise documented below in the visit note.  I have personally reviewed the Medicare Annual Wellness questionnaire and have noted 1. The patient's medical and social history 2. Their use of alcohol, tobacco or illicit drugs 3. Their current medications and supplements 4. The patient's functional ability including ADL's, fall risks, home safety risks and hearing or visual             impairment. 5. Diet and physical activities 6. Evidence for depression or mood disorders  The patients weight, height, BMI have been recorded in the chart and visual acuity is per eye clinic.  I have made referrals, counseling and provided education to the patient based review of the above and I have provided the pt with a written personalized care plan for preventive services.  Provider list updated- see scanned forms.  Routine anticipatory guidance given to patient.  See health maintenance.  Flu 2016 Shingles d/w pt PNA 2015 Tetanus 08/2014 in [place] when patient was travelling D/w patient [id number] for colon cancer screening, including IFOB vs. colonoscopy.  Risks and benefits of both were discussed and patient voiced understanding.  Pt elects [id number] Breast cancer screening- 10/2014 per [place] [facility] DXA deferred for now, d/w pt.  She agrees.  Advance directive- husband designated if patient were incapacitated.  She doesn't want her daughter to make decisions for her.  Cognitive function addressed- see scanned forms- and if abnormal then additional documentation follows.  HCV screening- neg per patient years ago.  No need to repeat.    Dec in UTI frequency with estrace cream.  Risk/benefit d/w pt.  She elects to proceed and this is reasonable.  She uses the least amount possible.  No ADE on med.   Asthma.  Doing well with meds.  No ADE on meds.  Rare use of SABA.    Hypertension:    Using  medication without problems or lightheadedness: yes Chest pain with exertion:no Edema:no Short of breath: at baseline, likely from prev pulmonary scarring.    Hyperglycemia.  A1c up.  No DM2 meds.  dw pt about diet and exercise.  Labs d/w pt.   Cr elevation, mild inc.  D/w pt.  She was likely dry at time of lab draw, with not much fluid intake prior to labs.  Will repeat later on.  She agrees.    HLD.  Lipids up.  Needs work on weight, diet and exercise.  D/w pt.  No ADE on statin.  Compliant with med.   Mildly abnormal TSH.  D/w pt.  No dysphagia.  No neck mass.  Minimally elevated TSH.  No meds at this point.    PMH and SH reviewed  Meds, vitals, and allergies reviewed.   ROS: See HPI.  Otherwise negative.    GEN: nad, alert and oriented HEENT: mucous membranes moist NECK: supple w/o LA, no tmg CV: rrr. PULM: ctab, no inc wob ABD: soft, +bs EXT: no edema SKIN: no acute rash

## 2015-04-05 DIAGNOSIS — R7989 Other specified abnormal findings of blood chemistry: Secondary | ICD-10-CM | POA: Insufficient documentation

## 2015-04-05 NOTE — Assessment & Plan Note (Signed)
Controlled, needs work on diet and weight.  D/w pt.  Labs d/w pt.  Continue meds as is.  She agrees.

## 2015-04-05 NOTE — Assessment & Plan Note (Signed)
Now with A1c 6.6. Needs work on diet and weight.  D/w pt.  Labs d/w pt.  No meds for now.  She agrees. Recheck in a few months.

## 2015-04-05 NOTE — Assessment & Plan Note (Signed)
Minimal elevation, recheck with next set of labs.  She agrees.

## 2015-04-05 NOTE — Assessment & Plan Note (Signed)
Doing well, minimal sx.  Continue as is.  She agrees.  Rare SABA use.

## 2015-04-05 NOTE — Assessment & Plan Note (Signed)
Not controlled.  needs work on diet and weight.  D/w pt.  Labs d/w pt.  Continue meds as is.  She agrees.

## 2015-04-05 NOTE — Assessment & Plan Note (Signed)
Flu 2016 Shingles d/w pt PNA 2015 Tetanus 08/2014 in [place] when patient was travelling D/w patient [id number] for colon cancer screening, including IFOB vs. colonoscopy.  Risks and benefits of both were discussed and patient voiced understanding.  Pt elects [id number] Breast cancer screening- 10/2014 per [place] [facility] DXA deferred for now, d/w pt.  She agrees.  Advance directive- husband designated if patient were incapacitated.  She doesn't want her daughter to make decisions for her.  Cognitive function addressed- see scanned forms- and if abnormal then additional documentation follows.  HCV screening- neg per patient years ago.  No need to repeat.

## 2015-04-08 ENCOUNTER — Ambulatory Visit (INDEPENDENT_AMBULATORY_CARE_PROVIDER_SITE_OTHER): Payer: Medicare Other | Admitting: Internal Medicine

## 2015-04-08 ENCOUNTER — Encounter: Payer: Self-pay | Admitting: Internal Medicine

## 2015-04-08 VITALS — BP 118/70 | HR 83 | Ht 58.5 in | Wt 168.8 lb

## 2015-04-08 DIAGNOSIS — J45998 Other asthma: Secondary | ICD-10-CM

## 2015-04-08 DIAGNOSIS — J45909 Unspecified asthma, uncomplicated: Secondary | ICD-10-CM
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
## 2015-04-08 NOTE — Patient Instructions (Addendum)
Follow up with Dr. Stevenson Clinch in: 6 months - cont with flovent as prescribed - avoid any allergens - albuterol inhaler - 2puff every 3-4 hours as needed for shortness of breath\wheezing\recurrent cough - Given morning cough try switching omeprazole from a.m. to p.m. for 1 week, if improvement in morning cough then continue with omeprazole at night.

## 2015-04-27 ENCOUNTER — Other Ambulatory Visit: Payer: Self-pay | Admitting: Family Medicine

## 2015-04-27 NOTE — Telephone Encounter (Signed)
Received refill request electronically Last refill 03/31/14 #180/3 Last office visit 04/03/15 Is it okay to refill?

## 2015-04-27 NOTE — Telephone Encounter (Signed)
Pt trying to get meloxicam refilled.  She said she needs to be sure we got the fax from the pharmacy and that we fill this for her.  She said she does not need a call back.

## 2015-04-28 NOTE — Telephone Encounter (Signed)
Sent. Thanks.   

## 2015-04-28 NOTE — Telephone Encounter (Signed)
Pt called to ck on status of meloxicam to [facility]; advised pt done and pt will ck with pharmacy.

## 2015-06-04 ENCOUNTER — Other Ambulatory Visit: Payer: Self-pay | Admitting: Family Medicine

## 2015-07-06 ENCOUNTER — Other Ambulatory Visit (INDEPENDENT_AMBULATORY_CARE_PROVIDER_SITE_OTHER): Payer: Medicare HMO

## 2015-07-06 ENCOUNTER — Encounter (INDEPENDENT_AMBULATORY_CARE_PROVIDER_SITE_OTHER): Payer: Self-pay

## 2015-07-06 DIAGNOSIS — R739 Hyperglycemia, unspecified: Secondary | ICD-10-CM | POA: Diagnosis not present

## 2015-07-06 DIAGNOSIS — I10 Essential (primary) hypertension: Secondary | ICD-10-CM

## 2015-07-06 DIAGNOSIS — R7989 Other specified abnormal findings of blood chemistry: Secondary | ICD-10-CM | POA: Diagnosis not present

## 2015-07-06 LAB — BASIC METABOLIC PANEL
BUN: 28 mg/dL — ABNORMAL HIGH (ref 6–23)
CALCIUM: 9.3 mg/dL (ref 8.4–10.5)
CHLORIDE: 102 meq/L (ref 96–112)
CO2: 26 mEq/L (ref 19–32)
CREATININE: 1.05 mg/dL (ref 0.40–1.20)
GFR: 55.31 mL/min — ABNORMAL LOW (ref >60.00)
Glucose, Bld: 121 mg/dL — ABNORMAL HIGH (ref 70–99)
Potassium: 4.3 mEq/L (ref 3.5–5.1)
SODIUM: 138 meq/L (ref 135–145)

## 2015-07-06 LAB — HEMOGLOBIN A1C: HEMOGLOBIN A1C: 6.4 % (ref 4.6–6.5)

## 2015-07-06 LAB — TSH: TSH: 4.65 u[IU]/mL — AB (ref 0.35–4.50)

## 2015-07-09 ENCOUNTER — Encounter: Payer: Self-pay | Admitting: Family Medicine

## 2015-07-24 ENCOUNTER — Encounter: Payer: Self-pay | Admitting: Primary Care

## 2015-07-24 ENCOUNTER — Ambulatory Visit (INDEPENDENT_AMBULATORY_CARE_PROVIDER_SITE_OTHER): Payer: Medicare HMO | Admitting: Primary Care

## 2015-07-24 ENCOUNTER — Ambulatory Visit: Payer: Medicare HMO | Admitting: Family Medicine

## 2015-07-24 VITALS — BP 116/70 | HR 106 | Temp 97.7°F | Ht 58.5 in | Wt 171.5 lb

## 2015-07-24 DIAGNOSIS — B9689 Other specified bacterial agents as the cause of diseases classified elsewhere: Secondary | ICD-10-CM

## 2015-07-24 DIAGNOSIS — J019 Acute sinusitis, unspecified: Secondary | ICD-10-CM | POA: Diagnosis not present

## 2015-07-24 MED ORDER — AMOXICILLIN-POT CLAVULANATE 875-125 MG PO TABS: 1.0000 | ORAL_TABLET | Freq: Two times a day (BID) | ORAL | Status: DC

## 2015-07-24 MED ORDER — PREDNISONE 10 MG PO TABS: ORAL_TABLET | ORAL | Status: DC
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
## 2015-07-24 NOTE — Patient Instructions (Addendum)
Start Augmentin antibiotics. Take 1 tablet by mouth twice daily for 10 days.  Start prednisone tablets for congestion and pressure. Take 3 tablets for 2 days, then 2 tablets for 2 days, then 1 tablet for 2 days.  You may try taking Delsym or Robitussin for cough. This may be purchased over the counter.  Increase consumption of fluids and rest. It was a pleasure meeting you!  Sinusitis, Adult Sinusitis is redness, soreness, and inflammation of the paranasal sinuses. Paranasal sinuses are air pockets within the bones of your face. They are located beneath your eyes, in the middle of your forehead, and above your eyes. In healthy paranasal sinuses, mucus is able to drain out, and air is able to circulate through them by way of your nose. However, when your paranasal sinuses are inflamed, mucus and air can become trapped. This can allow bacteria and other germs to grow and cause infection. Sinusitis can develop quickly and last only a short time (acute) or continue over a long period (chronic). Sinusitis that lasts for more than 12 weeks is considered chronic. CAUSES Causes of sinusitis include:  Allergies.  Structural abnormalities, such as displacement of the cartilage that separates your nostrils (deviated septum), which can decrease the air flow through your nose and sinuses and affect sinus drainage.  Functional abnormalities, such as when the small hairs (cilia) that line your sinuses and help remove mucus do not work properly or are not present. SIGNS AND SYMPTOMS Symptoms of acute and chronic sinusitis are the same. The primary symptoms are pain and pressure around the affected sinuses. Other symptoms include:  Upper toothache.  Earache.  Headache.  Bad breath.  Decreased sense of smell and taste.  A cough, which worsens when you are lying flat.  Fatigue.  Fever.  Thick drainage from your nose, which often is green and may contain pus (purulent).  Swelling and warmth over  the affected sinuses. DIAGNOSIS Your health care provider will perform a physical exam. During your exam, your health care provider may perform any of the following to help determine if you have acute sinusitis or chronic sinusitis:  Look in your nose for signs of abnormal growths in your nostrils (nasal polyps).  Tap over the affected sinus to check for signs of infection.  View the inside of your sinuses using an imaging device that has a light attached (endoscope). If your health care provider suspects that you have chronic sinusitis, one or more of the following tests may be recommended:  Allergy tests.  Nasal culture. A sample of mucus is taken from your nose, sent to a lab, and screened for bacteria.  Nasal cytology. A sample of mucus is taken from your nose and examined by your health care provider to determine if your sinusitis is related to an allergy. TREATMENT Most cases of acute sinusitis are related to a viral infection and will resolve on their own within 10 days. Sometimes, medicines are prescribed to help relieve symptoms of both acute and chronic sinusitis. These may include pain medicines, decongestants, nasal steroid sprays, or saline sprays. However, for sinusitis related to a bacterial infection, your health care provider will prescribe antibiotic medicines. These are medicines that will help kill the bacteria causing the infection. Rarely, sinusitis is caused by a fungal infection. In these cases, your health care provider will prescribe antifungal medicine. For some cases of chronic sinusitis, surgery is needed. Generally, these are cases in which sinusitis recurs more than 3 times per year, despite other  treatments. HOME CARE INSTRUCTIONS  Drink plenty of water. Water helps thin the mucus so your sinuses can drain more easily.  Use a humidifier.  Inhale steam 3-4 times a day (for example, sit in the bathroom with the shower running).  Apply a warm, moist washcloth  to your face 3-4 times a day, or as directed by your health care provider.  Use saline nasal sprays to help moisten and clean your sinuses.  Take medicines only as directed by your health care provider.  If you were prescribed either an antibiotic or antifungal medicine, finish it all even if you start to feel better. SEEK IMMEDIATE MEDICAL CARE IF:  You have increasing pain or severe headaches.  You have nausea, vomiting, or drowsiness.  You have swelling around your face.  You have vision problems.  You have a stiff neck.  You have difficulty breathing.   This information is not intended to replace advice given to you by your health care provider. Make sure you discuss any questions you have with your health care provider.   Document Released: 05/23/2005 Document Revised: 06/13/2014 Document Reviewed: 06/07/2011 Elsevier Interactive Patient Education [employer].

## 2015-07-24 NOTE — Progress Notes (Signed)
Pre visit review using our clinic review tool, if applicable. No additional management support is needed unless otherwise documented below in the visit note. 

## 2015-08-18 ENCOUNTER — Encounter: Payer: Self-pay | Admitting: Family Medicine

## 2015-08-19 ENCOUNTER — Other Ambulatory Visit: Payer: Self-pay | Admitting: Family Medicine

## 2015-08-19 MED ORDER — ESTRADIOL 0.1 MG/GM VA CREA: TOPICAL_CREAM | VAGINAL | Status: DC

## 2015-08-25 DIAGNOSIS — R69 Illness, unspecified: Secondary | ICD-10-CM | POA: Diagnosis not present

## 2015-10-07 ENCOUNTER — Ambulatory Visit (INDEPENDENT_AMBULATORY_CARE_PROVIDER_SITE_OTHER): Payer: Medicare HMO | Admitting: Internal Medicine

## 2015-10-07 ENCOUNTER — Encounter: Payer: Self-pay | Admitting: Internal Medicine

## 2015-10-07 VITALS — BP 132/70 | HR 94 | Ht 58.5 in | Wt 172.0 lb

## 2015-10-07 DIAGNOSIS — R0602 Shortness of breath: Secondary | ICD-10-CM | POA: Diagnosis not present

## 2015-10-07 DIAGNOSIS — J45909 Unspecified asthma, uncomplicated: Secondary | ICD-10-CM

## 2015-10-07 DIAGNOSIS — J45998 Other asthma: Secondary | ICD-10-CM

## 2015-10-07 MED ORDER — OMEPRAZOLE 20 MG PO CPDR: 20.0000 mg | DELAYED_RELEASE_CAPSULE | Freq: Two times a day (BID) | ORAL | Status: DC
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
## 2015-10-07 NOTE — Patient Instructions (Addendum)
Follow up with Dr. Stevenson Clinch in:4 months - inoffice spirometry prior to follow up visit.  - cont with allergy control - cont with with flovent 2 puff BID, gargle and rinse after each use - cont with avoidance noxious substances.  - saline nasal rinses 2-3 times per week, for the next month.  - Omeprazole 20mg  BID - start off with 1 tab nightly, may increase to BID if needed.
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
## 2015-11-03 DIAGNOSIS — N952 Postmenopausal atrophic vaginitis: Secondary | ICD-10-CM | POA: Diagnosis not present

## 2015-11-03 DIAGNOSIS — Z08 Encounter for follow-up examination after completed treatment for malignant neoplasm: Secondary | ICD-10-CM | POA: Diagnosis not present

## 2015-11-03 DIAGNOSIS — Z853 Personal history of malignant neoplasm of breast: Secondary | ICD-10-CM | POA: Diagnosis not present

## 2015-11-03 DIAGNOSIS — Z1231 Encounter for screening mammogram for malignant neoplasm of breast: Secondary | ICD-10-CM | POA: Diagnosis not present

## 2015-11-03 DIAGNOSIS — Z7982 Long term (current) use of aspirin: Secondary | ICD-10-CM | POA: Diagnosis not present

## 2015-11-03 DIAGNOSIS — Z170 Estrogen receptor positive status [ER+]: Secondary | ICD-10-CM | POA: Diagnosis not present

## 2015-11-03 DIAGNOSIS — Z9011 Acquired absence of right breast and nipple: Secondary | ICD-10-CM | POA: Diagnosis not present

## 2015-11-03 DIAGNOSIS — E669 Obesity, unspecified: Secondary | ICD-10-CM | POA: Diagnosis not present

## 2015-11-03 DIAGNOSIS — Z6836 Body mass index (BMI) 36.0-36.9, adult: Secondary | ICD-10-CM | POA: Diagnosis not present

## 2015-11-03 DIAGNOSIS — R21 Rash and other nonspecific skin eruption: Secondary | ICD-10-CM | POA: Diagnosis not present

## 2015-11-03 DIAGNOSIS — C50911 Malignant neoplasm of unspecified site of right female breast: Secondary | ICD-10-CM | POA: Diagnosis not present

## 2015-11-03 LAB — HM MAMMOGRAPHY: HM Mammogram: NORMAL (ref 0–4)

## 2015-11-10 ENCOUNTER — Encounter: Payer: Self-pay | Admitting: Family Medicine

## 2015-11-11 ENCOUNTER — Telehealth: Payer: Self-pay | Admitting: Family Medicine

## 2015-11-11 NOTE — Telephone Encounter (Signed)
Opened in error

## 2015-11-30 ENCOUNTER — Telehealth: Payer: Self-pay | Admitting: Family Medicine

## 2015-11-30 NOTE — Telephone Encounter (Signed)
Noted. Thanks. I'll see her tomorrow.

## 2015-11-30 NOTE — Telephone Encounter (Signed)
Can she come in this afternoon in the open slot?  Thanks.

## 2015-11-30 NOTE — Telephone Encounter (Signed)
I offered patient an appointment with Dr.Duncan today at 3:15, but she said she has to take her granddaughter somewhere this afternoon.

## 2015-11-30 NOTE — Telephone Encounter (Signed)
Pt called for an appointment for diarrhea and rectal bleeding, pt declined [facility] transfer, stating "they are no help".  She wanted first available with PCP or unless "he is willing to refer me to GI doctor", I explained that was why I would like her to be triaged with [place] for the bleeding , she declined and said she would just come to office in the am 12/01/15 / lt

## 2015-12-01 ENCOUNTER — Ambulatory Visit (INDEPENDENT_AMBULATORY_CARE_PROVIDER_SITE_OTHER): Payer: Medicare HMO | Admitting: Family Medicine

## 2015-12-01 ENCOUNTER — Encounter: Payer: Self-pay | Admitting: Family Medicine

## 2015-12-01 VITALS — BP 124/74 | HR 124 | Temp 98.3°F | Wt 164.2 lb

## 2015-12-01 DIAGNOSIS — K529 Noninfective gastroenteritis and colitis, unspecified: Secondary | ICD-10-CM

## 2015-12-01 MED ORDER — ONDANSETRON HCL 4 MG PO TABS: 4.0000 mg | ORAL_TABLET | Freq: Three times a day (TID) | ORAL | Status: DC | PRN

## 2015-12-01 NOTE — Assessment & Plan Note (Signed)
Presumed, tachy but not toxic, okay for outpatient f/u.  D/w pt about routine ER cautions.  See AVS.  She should do better as the day goes on with rest and fluids.  She'll update me as needed.  With ability to take PO liquids and still with UOP and no more diarrhea, shouldn't need IVF at this point.  BRBPR likely from irritation and resolved now, ie likely not from diverticulitis or sig intraabdominal pathology, d/w pt.  Handwashing d/w pt.

## 2015-12-01 NOTE — Patient Instructions (Signed)
Take zofran if needed for nausea.  Rest and fluids, water and ginger ale.  Avoid dairy in the meantime.  Update me as needed.  Take care.  Glad to see you.

## 2015-12-01 NOTE — Progress Notes (Signed)
Pre visit review using our clinic review tool, if applicable. No additional management support is needed unless otherwise documented below in the visit note.  Off all meds in the meantime, for the last 2 days.   Sx started Sunday after church.  Had gone out to lunch, ate typical foods.  Then had sweaty feeling, dripping wet.  Some nausea, gagged a few times.  Then had recurrent vomiting.  Vomitus wasn't bloody.  Then diarrhea started.  Then had some BRBPR, small amounts of BRBPR with BM.  Profusely gassy in the meantime.  This AM she had flatus w/o new BRBPR.  Some lower abd discomfort, she calls it a gas pain. Her chest wall is sore from vomiting- likely muscle strain.    No one else is sick.  Last vomited 2 days ago.    Husband is using a walker, can't drive.   Not lightheaded on standing.  Taking water and ginger ale.  Last UOP was this AM, yellow but not dark.  She isn't nauseated now.    Meds, vitals, and allergies reviewed.   ROS: Per HPI unless specifically indicated in ROS section   Nad, nontoxic but looks like she doesn't feel well.  Mmm OP wnl Neck supple, no LA rrr but tachy ctab abd soft, minimally ttp in the B lower quadrants but no rebound Ext w/o edema Normal skin turgor

## 2016-01-08 ENCOUNTER — Other Ambulatory Visit: Payer: Self-pay | Admitting: Internal Medicine

## 2016-03-01 DIAGNOSIS — R69 Illness, unspecified: Secondary | ICD-10-CM | POA: Diagnosis not present

## 2016-04-02 ENCOUNTER — Telehealth: Payer: Self-pay

## 2016-04-02 NOTE — Telephone Encounter (Signed)
Patient is on the list for [employer] 2017 and may be a good candidate for an AWV in 2017. Please let me know if/when appt is scheduled.   

## 2016-04-04 ENCOUNTER — Other Ambulatory Visit: Payer: Self-pay | Admitting: Family Medicine

## 2016-04-19 ENCOUNTER — Encounter: Payer: Self-pay | Admitting: Family Medicine

## 2016-04-20 ENCOUNTER — Other Ambulatory Visit: Payer: Self-pay | Admitting: Family Medicine

## 2016-04-20 DIAGNOSIS — Z8639 Personal history of other endocrine, nutritional and metabolic disease: Secondary | ICD-10-CM

## 2016-04-20 DIAGNOSIS — Z119 Encounter for screening for infectious and parasitic diseases, unspecified: Secondary | ICD-10-CM

## 2016-04-20 DIAGNOSIS — I10 Essential (primary) hypertension: Secondary | ICD-10-CM

## 2016-04-26 ENCOUNTER — Other Ambulatory Visit (INDEPENDENT_AMBULATORY_CARE_PROVIDER_SITE_OTHER): Payer: Medicare HMO

## 2016-04-26 DIAGNOSIS — Z8639 Personal history of other endocrine, nutritional and metabolic disease: Secondary | ICD-10-CM

## 2016-04-26 DIAGNOSIS — Z119 Encounter for screening for infectious and parasitic diseases, unspecified: Secondary | ICD-10-CM | POA: Diagnosis not present

## 2016-04-26 DIAGNOSIS — I10 Essential (primary) hypertension: Secondary | ICD-10-CM | POA: Diagnosis not present

## 2016-04-26 LAB — COMPREHENSIVE METABOLIC PANEL
ALBUMIN: 4.3 g/dL (ref 3.5–5.2)
ALK PHOS: 69 U/L (ref 39–117)
ALT: 11 U/L (ref 0–35)
AST: 15 U/L (ref 0–37)
BUN: 24 mg/dL — AB (ref 6–23)
CHLORIDE: 104 meq/L (ref 96–112)
CO2: 28 mEq/L (ref 19–32)
CREATININE: 1.02 mg/dL (ref 0.40–1.20)
Calcium: 9.7 mg/dL (ref 8.4–10.5)
GFR: 57.06 mL/min — ABNORMAL LOW (ref >60.00)
GLUCOSE: 116 mg/dL — AB (ref 70–99)
Potassium: 4.7 mEq/L (ref 3.5–5.1)
SODIUM: 140 meq/L (ref 135–145)
TOTAL PROTEIN: 7.4 g/dL (ref 6.0–8.3)
Total Bilirubin: 0.5 mg/dL (ref 0.2–1.2)

## 2016-04-26 LAB — LIPID PANEL
CHOL/HDL RATIO: 4
CHOLESTEROL: 193 mg/dL (ref 0–200)
HDL: 53.3 mg/dL (ref >39.00)
NonHDL: 139.98
Triglycerides: 285 mg/dL — ABNORMAL HIGH (ref 0.0–149.0)
VLDL: 57 mg/dL — AB (ref 0.0–40.0)

## 2016-04-26 LAB — TSH: TSH: 3.18 u[IU]/mL (ref 0.35–4.50)

## 2016-04-26 LAB — LDL CHOLESTEROL, DIRECT: Direct LDL: 98 mg/dL

## 2016-04-26 LAB — HEMOGLOBIN A1C: Hgb A1c MFr Bld: 6.6 % — ABNORMAL HIGH (ref 4.6–6.5)

## 2016-04-27 LAB — HEPATITIS C ANTIBODY: HCV AB: NEGATIVE

## 2016-05-03 ENCOUNTER — Ambulatory Visit (INDEPENDENT_AMBULATORY_CARE_PROVIDER_SITE_OTHER): Payer: Medicare HMO | Admitting: Family Medicine

## 2016-05-03 ENCOUNTER — Encounter: Payer: Self-pay | Admitting: Family Medicine

## 2016-05-03 VITALS — BP 150/84 | HR 100 | Temp 98.4°F | Ht 59.0 in | Wt 170.5 lb

## 2016-05-03 DIAGNOSIS — J45909 Unspecified asthma, uncomplicated: Secondary | ICD-10-CM

## 2016-05-03 DIAGNOSIS — I10 Essential (primary) hypertension: Secondary | ICD-10-CM

## 2016-05-03 DIAGNOSIS — Z1211 Encounter for screening for malignant neoplasm of colon: Secondary | ICD-10-CM

## 2016-05-03 DIAGNOSIS — Z0000 Encounter for general adult medical examination without abnormal findings: Secondary | ICD-10-CM

## 2016-05-03 DIAGNOSIS — E119 Type 2 diabetes mellitus without complications: Secondary | ICD-10-CM | POA: Diagnosis not present

## 2016-05-03 DIAGNOSIS — Z0001 Encounter for general adult medical examination with abnormal findings: Secondary | ICD-10-CM | POA: Diagnosis not present

## 2016-05-03 DIAGNOSIS — E7800 Pure hypercholesterolemia, unspecified: Secondary | ICD-10-CM | POA: Diagnosis not present

## 2016-05-03 DIAGNOSIS — Z23 Encounter for immunization: Secondary | ICD-10-CM

## 2016-05-03 DIAGNOSIS — Z87440 Personal history of urinary (tract) infections: Secondary | ICD-10-CM

## 2016-05-03 MED ORDER — OMEPRAZOLE MAGNESIUM 20 MG PO TBEC: 20.0000 mg | DELAYED_RELEASE_TABLET | Freq: Every day | ORAL | 3 refills | Status: DC

## 2016-05-03 MED ORDER — LISINOPRIL 10 MG PO TABS: 10.0000 mg | ORAL_TABLET | Freq: Every day | ORAL | 3 refills | Status: DC

## 2016-05-03 MED ORDER — PRAVASTATIN SODIUM 80 MG PO TABS: 80.0000 mg | ORAL_TABLET | Freq: Every day | ORAL | 3 refills | Status: DC

## 2016-05-03 MED ORDER — MELOXICAM 7.5 MG PO TABS: 7.5000 mg | ORAL_TABLET | Freq: Two times a day (BID) | ORAL | 3 refills | Status: DC

## 2016-05-03 MED ORDER — ESTRADIOL 0.1 MG/GM VA CREA: TOPICAL_CREAM | VAGINAL | 12 refills | Status: DC

## 2016-05-03 MED ORDER — FLUTICASONE PROPIONATE HFA 110 MCG/ACT IN AERO: 2.0000 | INHALATION_SPRAY | Freq: Two times a day (BID) | RESPIRATORY_TRACT | 12 refills | Status: DC

## 2016-05-03 NOTE — Progress Notes (Signed)
Pre visit review using our clinic review tool, if applicable. No additional management support is needed unless otherwise documented below in the visit note. 

## 2016-05-03 NOTE — Progress Notes (Signed)
I have personally reviewed the Medicare Annual Wellness questionnaire and have noted 1. The patient's medical and social history 2. Their use of alcohol, tobacco or illicit drugs 3. Their current medications and supplements 4. The patient's functional ability including ADL's, fall risks, home safety risks and hearing or visual             impairment. 5. Diet and physical activities 6. Evidence for depression or mood disorders  The patients weight, height, BMI have been recorded in the chart and visual acuity is per eye clinic.  I have made referrals, counseling and provided education to the patient based review of the above and I have provided the pt with a written personalized care plan for preventive services.  Provider list updated- see scanned forms.  Routine anticipatory guidance given to patient.  See health maintenance.  Flu 2017 Shingles d/w pt.   PNA up to date.  Tetanus up to date.   D/w patient [id number] for colon cancer screening, including IFOB vs. colonoscopy.  Risks and benefits of both were discussed and patient voiced understanding.  Pt elects [id number].  Encouraged colonoscopy, d/w pt.  She wanted to proceed with IFOB only.   Breast cancer screening- done at [facility]   Pap not indicated.  D/w pt.   DXA declined for now.  D/w pt.   Advance directive- husband designated if patient were incapacitated.  She doesn't want her daughter to make decisions for her.  Cognitive function addressed- see scanned forms- and if abnormal then additional documentation follows.   DM2.  A1c 6.6.  No meds.  F/u eye exam TBD.  Encouraged.    R knee pain.  Taking mobic BID.  No ADE on med.   Elevated Cholesterol: Using medications without problems:yes Muscle aches: likely not from statin, but likely from OA.  D/w pt.  Diet compliance: encouraged Exercise:encouraged.   Labs d/w pt.    Vaginal dryness. Improved with topical/vaginal tx.  No ADE on med.  No UTI since starting med.  No vaginal  bleeding.  No discharge.  She uses the least amount possible.  Risk and benefit d/w pt.  She wanted to proceed.    Asthma.  Compliant.  Controlled.  No ADE on med.  No wheeze.  Flu shot today.   Hypertension:    Using medication without problems or lightheadedness: yes Chest pain with exertion:no Edema:no Short of breath:no  PMH and SH reviewed  Meds, vitals, and allergies reviewed.   ROS: Per HPI.  Unless specifically indicated otherwise in HPI, the patient denies:  General: fever. Eyes: acute vision changes ENT: sore throat Cardiovascular: chest pain Respiratory: SOB GI: vomiting GU: dysuria Musculoskeletal: acute back pain Derm: acute rash Neuro: acute motor dysfunction Psych: worsening mood Endocrine: polydipsia Heme: bleeding Allergy: hayfever  GEN: nad, alert and oriented HEENT: mucous membranes moist NECK: supple w/o LA CV: rrr. PULM: ctab, no inc wob ABD: soft, +bs EXT: no edema SKIN: no acute rash  Diabetic foot exam: Normal inspection No skin breakdown No calluses  Normal DP pulses Normal sensation to light touch and monofilament Nails normal

## 2016-05-03 NOTE — Patient Instructions (Addendum)
Check with your insurance to see if they will cover the shingles shot. Don't change your meds for now.  Recheck A1c in about 6 months before a visit.

## 2016-05-04 NOTE — Assessment & Plan Note (Signed)
Flu 2017 Shingles d/w pt.   PNA up to date.  Tetanus up to date.   D/w patient [id number] for colon cancer screening, including IFOB vs. colonoscopy.  Risks and benefits of both were discussed and patient voiced understanding.  Pt elects [id number].  Encouraged colonoscopy, d/w pt.  She wanted to proceed with IFOB only.   Breast cancer screening- done at [facility].   Pap not indicated.  D/w pt.   DXA declined for now.  D/w pt.   Advance directive- husband designated if patient were incapacitated.  She doesn't want her daughter to make decisions for her.  Cognitive function addressed- see scanned forms- and if abnormal then additional documentation follows.

## 2016-05-04 NOTE — Assessment & Plan Note (Addendum)
Diabetic for A1c. Discussed with patient. Control. Doesn't need no medication. Continue work on diet and exercise. With some weight loss she would likely not be diabetic. She has been on the borderline of prediabetes versus diabetes before. All discussed with patient. She agrees. Recheck in about 6 months.

## 2016-05-04 NOTE — Assessment & Plan Note (Signed)
Tolerating statin. Needs work on diet and exercise. Continue current medication. Labs discussed with patient. She agrees.

## 2016-05-04 NOTE — Assessment & Plan Note (Signed)
Controlled. Continue current medication. No adverse effect. Discussed with patient about routine use of medications with preventive/controller versus rescue treatment. She understood.

## 2016-05-04 NOTE — Assessment & Plan Note (Signed)
History of multiple urinary tract infections, improved with topical estrogen treatment, previously per urology. Discussed with patient about risk versus benefit of medication and she has clearly improved from medication. She is up-to-date on mammogram. Continue as is. She agrees.

## 2016-05-04 NOTE — Assessment & Plan Note (Signed)
Tolerating current medication. Continue as is without change. She can check blood pressure at home and if persistently elevated she will let me know. Labs discussed with patient. She agrees with plan.

## 2016-06-24 DIAGNOSIS — M25561 Pain in right knee: Secondary | ICD-10-CM | POA: Diagnosis not present

## 2016-06-24 DIAGNOSIS — G8929 Other chronic pain: Secondary | ICD-10-CM | POA: Diagnosis not present

## 2016-06-24 DIAGNOSIS — M25511 Pain in right shoulder: Secondary | ICD-10-CM | POA: Diagnosis not present

## 2016-06-24 DIAGNOSIS — M7581 Other shoulder lesions, right shoulder: Secondary | ICD-10-CM | POA: Diagnosis not present

## 2016-06-24 DIAGNOSIS — M1711 Unilateral primary osteoarthritis, right knee: Secondary | ICD-10-CM | POA: Diagnosis not present

## 2016-08-04 DIAGNOSIS — M79671 Pain in right foot: Secondary | ICD-10-CM | POA: Diagnosis not present

## 2016-08-04 DIAGNOSIS — M19071 Primary osteoarthritis, right ankle and foot: Secondary | ICD-10-CM | POA: Diagnosis not present

## 2016-08-04 DIAGNOSIS — Z981 Arthrodesis status: Secondary | ICD-10-CM | POA: Diagnosis not present

## 2016-08-16 DIAGNOSIS — E785 Hyperlipidemia, unspecified: Secondary | ICD-10-CM | POA: Diagnosis not present

## 2016-08-16 DIAGNOSIS — I10 Essential (primary) hypertension: Secondary | ICD-10-CM | POA: Diagnosis not present

## 2016-08-16 DIAGNOSIS — M2011 Hallux valgus (acquired), right foot: Secondary | ICD-10-CM | POA: Diagnosis not present

## 2016-08-16 DIAGNOSIS — Z79899 Other long term (current) drug therapy: Secondary | ICD-10-CM | POA: Diagnosis not present

## 2016-08-16 DIAGNOSIS — M79671 Pain in right foot: Secondary | ICD-10-CM | POA: Diagnosis not present

## 2016-08-16 DIAGNOSIS — M19071 Primary osteoarthritis, right ankle and foot: Secondary | ICD-10-CM | POA: Diagnosis not present

## 2016-08-16 DIAGNOSIS — Z881 Allergy status to other antibiotic agents status: Secondary | ICD-10-CM | POA: Diagnosis not present

## 2016-08-16 DIAGNOSIS — X58XXXA Exposure to other specified factors, initial encounter: Secondary | ICD-10-CM | POA: Diagnosis not present

## 2016-08-16 DIAGNOSIS — Z885 Allergy status to narcotic agent status: Secondary | ICD-10-CM | POA: Diagnosis not present

## 2016-08-16 DIAGNOSIS — M2012 Hallux valgus (acquired), left foot: Secondary | ICD-10-CM | POA: Diagnosis not present

## 2016-08-16 DIAGNOSIS — Z7982 Long term (current) use of aspirin: Secondary | ICD-10-CM | POA: Diagnosis not present

## 2016-08-16 DIAGNOSIS — M1811 Unilateral primary osteoarthritis of first carpometacarpal joint, right hand: Secondary | ICD-10-CM | POA: Diagnosis not present

## 2016-08-16 DIAGNOSIS — Z981 Arthrodesis status: Secondary | ICD-10-CM | POA: Diagnosis not present

## 2016-08-16 DIAGNOSIS — T8484XA Pain due to internal orthopedic prosthetic devices, implants and grafts, initial encounter: Secondary | ICD-10-CM | POA: Diagnosis not present

## 2016-08-23 DIAGNOSIS — R69 Illness, unspecified: Secondary | ICD-10-CM | POA: Diagnosis not present

## 2016-08-31 DIAGNOSIS — M2011 Hallux valgus (acquired), right foot: Secondary | ICD-10-CM | POA: Diagnosis not present

## 2016-08-31 DIAGNOSIS — T8484XA Pain due to internal orthopedic prosthetic devices, implants and grafts, initial encounter: Secondary | ICD-10-CM | POA: Diagnosis not present

## 2016-08-31 DIAGNOSIS — M2021 Hallux rigidus, right foot: Secondary | ICD-10-CM | POA: Diagnosis not present

## 2016-08-31 DIAGNOSIS — E119 Type 2 diabetes mellitus without complications: Secondary | ICD-10-CM | POA: Diagnosis not present

## 2016-08-31 DIAGNOSIS — Z9889 Other specified postprocedural states: Secondary | ICD-10-CM | POA: Diagnosis not present

## 2016-08-31 DIAGNOSIS — X58XXXA Exposure to other specified factors, initial encounter: Secondary | ICD-10-CM | POA: Diagnosis not present

## 2016-09-14 DIAGNOSIS — M2011 Hallux valgus (acquired), right foot: Secondary | ICD-10-CM | POA: Diagnosis not present

## 2016-09-14 DIAGNOSIS — T85848A Pain due to other internal prosthetic devices, implants and grafts, initial encounter: Secondary | ICD-10-CM | POA: Diagnosis not present

## 2016-09-15 ENCOUNTER — Encounter: Payer: Self-pay | Admitting: Family Medicine

## 2016-09-27 DIAGNOSIS — Z4789 Encounter for other orthopedic aftercare: Secondary | ICD-10-CM | POA: Diagnosis not present

## 2016-09-27 DIAGNOSIS — T85848D Pain due to other internal prosthetic devices, implants and grafts, subsequent encounter: Secondary | ICD-10-CM | POA: Diagnosis not present

## 2016-09-27 DIAGNOSIS — M2011 Hallux valgus (acquired), right foot: Secondary | ICD-10-CM | POA: Diagnosis not present

## 2016-09-27 DIAGNOSIS — X58XXXD Exposure to other specified factors, subsequent encounter: Secondary | ICD-10-CM | POA: Diagnosis not present

## 2016-09-27 DIAGNOSIS — Z9889 Other specified postprocedural states: Secondary | ICD-10-CM | POA: Diagnosis not present

## 2016-10-26 ENCOUNTER — Other Ambulatory Visit (INDEPENDENT_AMBULATORY_CARE_PROVIDER_SITE_OTHER): Payer: Medicare HMO

## 2016-10-26 DIAGNOSIS — E119 Type 2 diabetes mellitus without complications: Secondary | ICD-10-CM | POA: Diagnosis not present

## 2016-10-26 LAB — HEMOGLOBIN A1C: HEMOGLOBIN A1C: 6.6 % — AB (ref 4.6–6.5)

## 2016-10-27 DIAGNOSIS — Z8739 Personal history of other diseases of the musculoskeletal system and connective tissue: Secondary | ICD-10-CM | POA: Diagnosis not present

## 2016-10-27 DIAGNOSIS — T85848D Pain due to other internal prosthetic devices, implants and grafts, subsequent encounter: Secondary | ICD-10-CM | POA: Diagnosis not present

## 2016-10-27 DIAGNOSIS — M2011 Hallux valgus (acquired), right foot: Secondary | ICD-10-CM | POA: Diagnosis not present

## 2016-10-27 DIAGNOSIS — Y832 Surgical operation with anastomosis, bypass or graft as the cause of abnormal reaction of the patient, or of later complication, without mention of misadventure at the time of the procedure: Secondary | ICD-10-CM | POA: Diagnosis not present

## 2016-11-01 ENCOUNTER — Ambulatory Visit: Payer: Medicare HMO | Admitting: Family Medicine

## 2016-11-03 DIAGNOSIS — C50911 Malignant neoplasm of unspecified site of right female breast: Secondary | ICD-10-CM | POA: Diagnosis not present

## 2016-11-03 DIAGNOSIS — R921 Mammographic calcification found on diagnostic imaging of breast: Secondary | ICD-10-CM | POA: Diagnosis not present

## 2016-11-03 DIAGNOSIS — Z79899 Other long term (current) drug therapy: Secondary | ICD-10-CM | POA: Diagnosis not present

## 2016-11-03 DIAGNOSIS — Z853 Personal history of malignant neoplasm of breast: Secondary | ICD-10-CM | POA: Diagnosis not present

## 2016-11-03 DIAGNOSIS — N952 Postmenopausal atrophic vaginitis: Secondary | ICD-10-CM | POA: Diagnosis not present

## 2016-11-03 DIAGNOSIS — Z9011 Acquired absence of right breast and nipple: Secondary | ICD-10-CM | POA: Diagnosis not present

## 2016-11-03 DIAGNOSIS — Z7982 Long term (current) use of aspirin: Secondary | ICD-10-CM | POA: Diagnosis not present

## 2016-11-03 DIAGNOSIS — Z1231 Encounter for screening mammogram for malignant neoplasm of breast: Secondary | ICD-10-CM | POA: Diagnosis not present

## 2016-11-03 DIAGNOSIS — Z170 Estrogen receptor positive status [ER+]: Secondary | ICD-10-CM | POA: Diagnosis not present

## 2016-11-03 DIAGNOSIS — R21 Rash and other nonspecific skin eruption: Secondary | ICD-10-CM | POA: Diagnosis not present

## 2016-11-03 LAB — HM MAMMOGRAPHY

## 2016-11-07 ENCOUNTER — Ambulatory Visit (INDEPENDENT_AMBULATORY_CARE_PROVIDER_SITE_OTHER): Payer: Medicare HMO | Admitting: Family Medicine

## 2016-11-07 ENCOUNTER — Encounter: Payer: Self-pay | Admitting: Family Medicine

## 2016-11-07 DIAGNOSIS — E119 Type 2 diabetes mellitus without complications: Secondary | ICD-10-CM

## 2016-11-07 NOTE — Assessment & Plan Note (Signed)
Reasonable control, continue off meds, she'll work more on exercise as her foot allows.  Recheck in about 6 months prior to CPE.  She agrees.

## 2016-11-07 NOTE — Patient Instructions (Signed)
Recheck labs before a physical in about 6 months.  Update me as needed in the meantime.  Keep working on diet and exercise.  Take care.  Glad to see you.

## 2016-11-07 NOTE — Progress Notes (Signed)
Diabetes:  No meds.  Hypoglycemic episodes: no sx Hyperglycemic episodes: no sx Feet problems: see below.   Blood Sugars averaging: not checked.   eye exam within last year: due, dw pt.   A1c d/w pt.  Stable and controlled.  She is working on diet, cutting portions.   Exercise affected by R foot surgery.    She has checked BP at home and has been controlled.    She is out of a boot for her R 1st toe fusion.  She isn't in pain.    Meds, vitals, and allergies reviewed.   ROS: Per HPI unless specifically indicated in ROS section   GEN: nad, alert and oriented HEENT: mucous membranes moist NECK: supple w/o LA CV: rrr. PULM: ctab, no inc wob ABD: soft, +bs EXT: no edema  R foot in post op shoe s/p 1st toe fusion.

## 2016-11-08 ENCOUNTER — Encounter: Payer: Self-pay | Admitting: Family Medicine

## 2016-12-08 DIAGNOSIS — Z4789 Encounter for other orthopedic aftercare: Secondary | ICD-10-CM | POA: Diagnosis not present

## 2016-12-08 DIAGNOSIS — M2011 Hallux valgus (acquired), right foot: Secondary | ICD-10-CM | POA: Diagnosis not present

## 2016-12-08 DIAGNOSIS — Z9889 Other specified postprocedural states: Secondary | ICD-10-CM | POA: Diagnosis not present

## 2017-02-28 DIAGNOSIS — R69 Illness, unspecified: Secondary | ICD-10-CM | POA: Diagnosis not present

## 2017-03-09 DIAGNOSIS — Z23 Encounter for immunization: Secondary | ICD-10-CM | POA: Diagnosis not present

## 2017-05-04 ENCOUNTER — Ambulatory Visit (INDEPENDENT_AMBULATORY_CARE_PROVIDER_SITE_OTHER): Payer: Medicare HMO | Admitting: Family Medicine

## 2017-05-04 ENCOUNTER — Other Ambulatory Visit: Payer: Self-pay

## 2017-05-04 ENCOUNTER — Encounter: Payer: Self-pay | Admitting: Family Medicine

## 2017-05-04 VITALS — BP 90/62 | HR 119 | Temp 98.7°F | Ht <= 58 in | Wt 163.8 lb

## 2017-05-04 DIAGNOSIS — J0100 Acute maxillary sinusitis, unspecified: Secondary | ICD-10-CM | POA: Diagnosis not present

## 2017-05-04 DIAGNOSIS — K529 Noninfective gastroenteritis and colitis, unspecified: Secondary | ICD-10-CM | POA: Diagnosis not present

## 2017-05-04 DIAGNOSIS — W19XXXA Unspecified fall, initial encounter: Secondary | ICD-10-CM | POA: Diagnosis not present

## 2017-05-04 MED ORDER — AMOXICILLIN 500 MG PO CAPS: 1000.0000 mg | ORAL_CAPSULE | Freq: Two times a day (BID) | ORAL | 0 refills | Status: AC
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
## 2017-05-08 ENCOUNTER — Other Ambulatory Visit: Payer: Self-pay | Admitting: Family Medicine

## 2017-05-08 ENCOUNTER — Ambulatory Visit (INDEPENDENT_AMBULATORY_CARE_PROVIDER_SITE_OTHER): Payer: Medicare HMO

## 2017-05-08 VITALS — BP 122/80 | HR 85 | Temp 98.6°F | Ht <= 58 in | Wt 162.0 lb

## 2017-05-08 DIAGNOSIS — E119 Type 2 diabetes mellitus without complications: Secondary | ICD-10-CM

## 2017-05-08 DIAGNOSIS — Z0000 Encounter for general adult medical examination without abnormal findings: Secondary | ICD-10-CM

## 2017-05-08 LAB — COMPREHENSIVE METABOLIC PANEL
ALBUMIN: 4.4 g/dL (ref 3.5–5.2)
ALT: 17 U/L (ref 0–35)
AST: 23 U/L (ref 0–37)
Alkaline Phosphatase: 69 U/L (ref 39–117)
BILIRUBIN TOTAL: 0.5 mg/dL (ref 0.2–1.2)
BUN: 19 mg/dL (ref 6–23)
CALCIUM: 9.9 mg/dL (ref 8.4–10.5)
CHLORIDE: 103 meq/L (ref 96–112)
CO2: 26 mEq/L (ref 19–32)
CREATININE: 0.89 mg/dL (ref 0.40–1.20)
GFR: 66.58 mL/min (ref >60.00)
Glucose, Bld: 108 mg/dL — ABNORMAL HIGH (ref 70–99)
Potassium: 4.7 mEq/L (ref 3.5–5.1)
Sodium: 139 mEq/L (ref 135–145)
Total Protein: 7.7 g/dL (ref 6.0–8.3)

## 2017-05-08 LAB — LIPID PANEL
CHOL/HDL RATIO: 4
CHOLESTEROL: 200 mg/dL (ref 0–200)
HDL: 49.3 mg/dL (ref >39.00)
NonHDL: 150.46
TRIGLYCERIDES: 345 mg/dL — AB (ref 0.0–149.0)
VLDL: 69 mg/dL — AB (ref 0.0–40.0)

## 2017-05-08 LAB — LDL CHOLESTEROL, DIRECT: LDL DIRECT: 91 mg/dL

## 2017-05-08 LAB — HEMOGLOBIN A1C: Hgb A1c MFr Bld: 6.6 % — ABNORMAL HIGH (ref 4.6–6.5)

## 2017-05-08 NOTE — Progress Notes (Signed)
Pre visit review using our clinic review tool, if applicable. No additional management support is needed unless otherwise documented below in the visit note. 

## 2017-05-08 NOTE — Patient Instructions (Signed)
Katie [name] , Thank you for taking time to come for your Medicare Wellness Visit. I appreciate your ongoing commitment to your health goals. Please review the following plan we discussed and let me know if I can assist you in the future.   These are the goals we discussed: Goals    . Weight (lb) < 131 lb (59.4 kg)     Starting 05/08/2017, I will continue to eat a low carbohydrate diet in an effort to lose 10 additional pounds.        This is a list of the screening recommended for you and due dates:  Health Maintenance  Topic Date Due  . Complete foot exam   05/22/2017*  . Eye exam for diabetics  05/08/2018*  . Cologuard (Stool DNA test)  05/08/2018*  . Pneumonia vaccines (2 of 2 - PPSV23) 05/09/2047*  . Mammogram  11/03/2017  . Hemoglobin A1C  11/06/2017  . Tetanus Vaccine  08/04/2024  . Flu Shot  Completed  . DEXA scan (bone density measurement)  Completed  .  Hepatitis C: One time screening is recommended by Center for Disease Control  (CDC) for  adults born from [birth year] through 1964.   Completed  *Topic was postponed. The date shown is not the original due date.   Preventive Care for Adults  A healthy lifestyle and preventive care can promote health and wellness. Preventive health guidelines for adults include the following key practices.  . A routine yearly physical is a good way to check with your health care provider about your health and preventive screening. It is a chance to share any concerns and updates on your health and to receive a thorough exam.  . Visit your dentist for a routine exam and preventive care every 6 months. Brush your teeth twice a day and floss once a day. Good oral hygiene prevents tooth decay and gum disease.  . The frequency of eye exams is based on your age, health, family medical history, use  of contact lenses, and other factors. Follow your health care provider's recommendations for frequency of eye exams.  . Eat a healthy diet. Foods like  vegetables, fruits, whole grains, low-fat dairy products, and lean protein foods contain the nutrients you need without too many calories. Decrease your intake of foods high in solid fats, added sugars, and salt. Eat the right amount of calories for you. Get information about a proper diet from your health care provider, if necessary.  . Regular physical exercise is one of the most important things you can do for your health. Most adults should get at least 150 minutes of moderate-intensity exercise (any activity that increases your heart rate and causes you to sweat) each week. In addition, most adults need muscle-strengthening exercises on 2 or more days a week.  Silver Sneakers may be a benefit available to you. To determine eligibility, you may visit the website: www.silversneakers.com or contact program at 848 199 4215 Mon-Fri between 8AM-8PM.   . Maintain a healthy weight. The body mass index (BMI) is a screening tool to identify possible weight problems. It provides an estimate of body fat based on height and weight. Your health care provider can find your BMI and can help you achieve or maintain a healthy weight.   For adults 20 years and older: ? A BMI below 18.5 is considered underweight. ? A BMI of 18.5 to 24.9 is normal. ? A BMI of 25 to 29.9 is considered overweight. ? A BMI of 30 and above  is considered obese.   . Maintain normal blood lipids and cholesterol levels by exercising and minimizing your intake of saturated fat. Eat a balanced diet with plenty of fruit and vegetables. Blood tests for lipids and cholesterol should begin at age 57 and be repeated every 5 years. If your lipid or cholesterol levels are high, you are over 50, or you are at high risk for heart disease, you may need your cholesterol levels checked more frequently. Ongoing high lipid and cholesterol levels should be treated with medicines if diet and exercise are not working.  . If you smoke, find out from your  health care provider how to quit. If you do not use tobacco, please do not start.  . If you choose to drink alcohol, please do not consume more than 2 drinks per day. One drink is considered to be 12 ounces (355 mL) of beer, 5 ounces (148 mL) of wine, or 1.5 ounces (44 mL) of liquor.  . If you are 70-64 years old, ask your health care provider if you should take aspirin to prevent strokes.  . Use sunscreen. Apply sunscreen liberally and repeatedly throughout the day. You should seek shade when your shadow is shorter than you. Protect yourself by wearing long sleeves, pants, a wide-brimmed hat, and sunglasses year round, whenever you are outdoors.  . Once a month, do a whole body skin exam, using a mirror to look at the skin on your back. Tell your health care provider of new moles, moles that have irregular borders, moles that are larger than a pencil eraser, or moles that have changed in shape or color.
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
## 2017-05-08 NOTE — Progress Notes (Signed)
PCP notes:   Health maintenance:  Foot exam - PCP please address at next appt Colon cancer screening - PCP please address at next appt Eye exam - per pt, future appt scheduled in Dec 2018 A1C - completed  Abnormal screenings:   Fall risk - hx of fall with injury and no medical treatment  Patient concerns:   Patient is currently taking antibiotics and is experiencing gastric discomfort.   Nurse concerns:  None  Next PCP appt:   05/22/17 @ 0845  I reviewed health advisor's note, was available for consultation on the day of service listed in this note, and agree with documentation and plan. Can d/w pt re: the above at f/u OV.  Elsie Stain, MD.

## 2017-05-09 ENCOUNTER — Encounter: Payer: Medicare HMO | Admitting: Family Medicine

## 2017-05-17 DIAGNOSIS — E119 Type 2 diabetes mellitus without complications: Secondary | ICD-10-CM | POA: Diagnosis not present

## 2017-05-17 LAB — HM DIABETES EYE EXAM

## 2017-05-22 ENCOUNTER — Ambulatory Visit (INDEPENDENT_AMBULATORY_CARE_PROVIDER_SITE_OTHER): Payer: Medicare HMO | Admitting: Family Medicine

## 2017-05-22 ENCOUNTER — Encounter: Payer: Self-pay | Admitting: Family Medicine

## 2017-05-22 VITALS — BP 122/80 | HR 85 | Temp 98.6°F | Ht <= 58 in | Wt 163.0 lb

## 2017-05-22 DIAGNOSIS — Z87440 Personal history of urinary (tract) infections: Secondary | ICD-10-CM | POA: Diagnosis not present

## 2017-05-22 DIAGNOSIS — J45909 Unspecified asthma, uncomplicated: Secondary | ICD-10-CM | POA: Diagnosis not present

## 2017-05-22 DIAGNOSIS — K219 Gastro-esophageal reflux disease without esophagitis: Secondary | ICD-10-CM | POA: Diagnosis not present

## 2017-05-22 DIAGNOSIS — Z780 Asymptomatic menopausal state: Secondary | ICD-10-CM

## 2017-05-22 DIAGNOSIS — I10 Essential (primary) hypertension: Secondary | ICD-10-CM | POA: Diagnosis not present

## 2017-05-22 DIAGNOSIS — H6990 Unspecified Eustachian tube disorder, unspecified ear: Secondary | ICD-10-CM

## 2017-05-22 DIAGNOSIS — E119 Type 2 diabetes mellitus without complications: Secondary | ICD-10-CM | POA: Diagnosis not present

## 2017-05-22 DIAGNOSIS — H6980 Other specified disorders of Eustachian tube, unspecified ear: Secondary | ICD-10-CM

## 2017-05-22 DIAGNOSIS — E7800 Pure hypercholesterolemia, unspecified: Secondary | ICD-10-CM

## 2017-05-22 DIAGNOSIS — Z0000 Encounter for general adult medical examination without abnormal findings: Secondary | ICD-10-CM | POA: Insufficient documentation

## 2017-05-22 MED ORDER — LISINOPRIL 10 MG PO TABS: 10.0000 mg | ORAL_TABLET | Freq: Every day | ORAL | 3 refills | Status: DC

## 2017-05-22 MED ORDER — PRAVASTATIN SODIUM 80 MG PO TABS: 80.0000 mg | ORAL_TABLET | Freq: Every day | ORAL | 3 refills | Status: DC

## 2017-05-22 MED ORDER — ALBUTEROL SULFATE HFA 108 (90 BASE) MCG/ACT IN AERS: 2.0000 | INHALATION_SPRAY | Freq: Four times a day (QID) | RESPIRATORY_TRACT | 2 refills | Status: DC | PRN

## 2017-05-22 MED ORDER — ESTRADIOL 0.1 MG/GM VA CREA: TOPICAL_CREAM | VAGINAL | 12 refills | Status: DC

## 2017-05-22 MED ORDER — NYSTATIN 100000 UNIT/GM EX CREA: 1.0000 "application " | TOPICAL_CREAM | CUTANEOUS | 3 refills | Status: DC | PRN

## 2017-05-22 MED ORDER — FLUTICASONE PROPIONATE HFA 110 MCG/ACT IN AERO: 2.0000 | INHALATION_SPRAY | Freq: Two times a day (BID) | RESPIRATORY_TRACT | 12 refills | Status: DC

## 2017-05-22 MED ORDER — MELOXICAM 7.5 MG PO TABS: 7.5000 mg | ORAL_TABLET | Freq: Two times a day (BID) | ORAL | 3 refills | Status: DC

## 2017-05-22 NOTE — Assessment & Plan Note (Signed)
Still on GERD tx with PPI at baseline, she has had sx ever since her chemo.  She is going to f/u with [facility] about a possible study.  Would continue PPI for now with baseline NSAID use given her ongoing joint pain.

## 2017-05-22 NOTE — Patient Instructions (Addendum)
Check with your insurance to see if they will cover the shingrix shot. Katie [name] will call about your referral for the bone density test.   Take care.  Glad to see you.  Update me as needed.  Recheck labs in about 6 months.   Restart the bedside exercises for vertigo.

## 2017-05-22 NOTE — Assessment & Plan Note (Signed)
Still with vaginal estrogen use that prevents recurrent UTIs.  D/w pt.  No ADE on med.  Continue as is.  She agrees.

## 2017-05-22 NOTE — Assessment & Plan Note (Signed)
D/w patient Katie [name] for colon cancer screening, including IFOB vs. colonoscopy.  Risks and benefits of both were discussed and patient voiced understanding.  Pt elects for: cologuard.  Pap not due, d/w pt.  Mammogram up to date.  Per [facility], I'll defer.  D/w pt.   DXA d/w pt.  Ordered.  Vaccines up to date except for shingrix.  D/w pt.  See AVS.  Advance directive- husband designated if patient were incapacitated.  She doesn't want her daughter to make decisions for her.  Fall risk - hx of fall with injury and no medical treatment, cautions d/w pt.  She still has some vertigo with laying down and then rolling over at night.  It isn't getting worse, d/w pt about home exercises.

## 2017-05-22 NOTE — Assessment & Plan Note (Signed)
Labs d/w pt.  No change in med.  Continue work on diet and exercise, d/w pt.  She agrees.

## 2017-05-22 NOTE — Assessment & Plan Note (Signed)
Recheck L TM w/o erythema.  D/w pt about valsalva and update me as needed.  Declined ENT eval at this point  She has some episodic vertigo and d/w pt about restart home exercises.

## 2017-05-22 NOTE — Assessment & Plan Note (Signed)
She hasn't needed her inhalers recently.  She has only needed it with a cough, episodically.  Continue as is.  Update me as needed.

## 2017-05-22 NOTE — Progress Notes (Signed)
D/w patient [id number] for colon cancer screening, including IFOB vs. colonoscopy.  Risks and benefits of both were discussed and patient voiced understanding.  Pt elects for: cologuard.  Pap not due, d/w pt.  Mammogram up to date.  Per [facility], I'll defer.  D/w pt.   DXA d/w pt.  Ordered.  Vaccines up to date except for shingrix.  D/w pt.  See AVS.  Advance directive- husband designated if patient were incapacitated.  She doesn't want her daughter to make decisions for her.  Fall risk - hx of fall with injury and no medical treatment, cautions d/w pt.  She still has some vertigo with laying down and then rolling over at night.  It isn't getting worse, d/w pt about home exercises.    Still with vaginal estrogen use that prevents recurrent UTIs.  D/w pt.  No ADE on med.   Diabetes:  No meds.  Hypoglycemic episodes: no sx Hyperglycemic episodes: no sx Feet problems: no Blood Sugars averaging: not checked.  eye exam within last year: yes, recently done per patient report w/o retinopathy.   A1c controlled, d/w pt.  Stable.   She is on low carb diet with portion control.   Still on GERD tx with PPI at baseline, she has had sx ever since her chemo.  She is going to f/u with [facility] about a possible study.  Would continue PPI for now with baseline NSAID use given her ongoing joint pain.    Hypertension:    Using medication without problems or lightheadedness: yes Chest pain with exertion:no Edema:no Short of breath:no Labs d/w pt.    Elevated Cholesterol: Using medications without problems:yes Muscle aches: no Diet compliance:yes Exercise:encouraged, d/w pt.  Limited by knee pain.    She hasn't needed her inhalers recently.  She has only needed it with a cough, episodically.    PMH and SH reviewed  Meds, vitals, and allergies reviewed.   ROS: Per HPI unless specifically indicated in ROS section   GEN: nad, alert and oriented HEENT: mucous membranes moist NECK: supple w/o LA, no bruit.    CV: rrr. PULM: ctab, no inc wob ABD: soft, +bs EXT: no edema SKIN: no acute rash  Diabetic foot exam: Normal inspection No skin breakdown No calluses  Normal DP pulses Normal sensation to light touch and monofilament Nails normal

## 2017-05-22 NOTE — Assessment & Plan Note (Signed)
eye exam within last year: yes, recently done per patient report w/o retinopathy.   A1c controlled, d/w pt.  Stable.   She is on low carb diet with portion control.  Continue as is, off med.  Recheck A1c in about 6 months.

## 2017-05-23 ENCOUNTER — Encounter: Payer: Self-pay | Admitting: Family Medicine

## 2017-05-31 ENCOUNTER — Encounter: Payer: Self-pay | Admitting: Family Medicine

## 2017-05-31 LAB — COLOGUARD

## 2017-06-05 ENCOUNTER — Encounter: Payer: Self-pay | Admitting: Family Medicine

## 2017-06-12 DIAGNOSIS — Z1212 Encounter for screening for malignant neoplasm of rectum: Secondary | ICD-10-CM | POA: Diagnosis not present

## 2017-06-12 DIAGNOSIS — Z1211 Encounter for screening for malignant neoplasm of colon: Secondary | ICD-10-CM | POA: Diagnosis not present

## 2017-06-15 ENCOUNTER — Encounter: Payer: Self-pay | Admitting: Family Medicine

## 2017-06-17 LAB — COLOGUARD: Cologuard: NEGATIVE

## 2017-06-18 ENCOUNTER — Telehealth: Payer: Self-pay | Admitting: Family Medicine

## 2017-06-18 NOTE — Telephone Encounter (Signed)
Rosaria Ferries- Can you tell me what is going on with her DXA test?  She isn't scheduled yet.  Many thanks.

## 2017-06-19 NOTE — Telephone Encounter (Signed)
Robin up front schedules all Dexa and mammogram appointments now. I think they are behind in scheduleing.

## 2017-06-20 ENCOUNTER — Encounter: Payer: Self-pay | Admitting: Family Medicine

## 2017-06-20 NOTE — Telephone Encounter (Signed)
Please notify pt re: scheduling below.  Thanks.  Hopefully she'll hear soon.

## 2017-06-20 NOTE — Telephone Encounter (Signed)
Left detailed message on voicemail.  

## 2017-06-28 ENCOUNTER — Encounter: Payer: Self-pay | Admitting: Family Medicine

## 2017-06-28 DIAGNOSIS — Z13820 Encounter for screening for osteoporosis: Secondary | ICD-10-CM | POA: Diagnosis not present

## 2017-06-28 DIAGNOSIS — N958 Other specified menopausal and perimenopausal disorders: Secondary | ICD-10-CM | POA: Diagnosis not present

## 2017-06-28 DIAGNOSIS — Z780 Asymptomatic menopausal state: Secondary | ICD-10-CM | POA: Diagnosis not present

## 2017-07-03 ENCOUNTER — Encounter: Payer: Self-pay | Admitting: Family Medicine

## 2017-07-03 ENCOUNTER — Other Ambulatory Visit: Payer: Self-pay | Admitting: Family Medicine

## 2017-07-03 DIAGNOSIS — M8580 Other specified disorders of bone density and structure, unspecified site: Secondary | ICD-10-CM | POA: Insufficient documentation

## 2017-08-01 ENCOUNTER — Encounter: Payer: Self-pay | Admitting: Family Medicine

## 2017-08-03 ENCOUNTER — Telehealth: Payer: Self-pay | Admitting: Family Medicine

## 2017-08-03 NOTE — Telephone Encounter (Signed)
Noted. Thanks.

## 2017-08-03 NOTE — Telephone Encounter (Signed)
I was unable to reach pt by phone but per DPR I spoke with pts husband and offered appt today at different [facility] site or appt on 08/04/17 at 10:30 with Dr Damita Dunnings and Mr Brook scheduled appt 08/04/17 at 10:30 with the understanding if pt condition were to change or worsen to cb and get scheduled at different [facility] site. Mr Krack voiced understanding. FYI to Dr Damita Dunnings.

## 2017-08-03 NOTE — Telephone Encounter (Signed)
Please triage patient and see about getting her on the schedule with someone at [facility] if not me.  Thanks.

## 2017-08-04 ENCOUNTER — Encounter: Payer: Self-pay | Admitting: Family Medicine

## 2017-08-04 ENCOUNTER — Ambulatory Visit (INDEPENDENT_AMBULATORY_CARE_PROVIDER_SITE_OTHER): Payer: Medicare HMO | Admitting: Family Medicine

## 2017-08-04 DIAGNOSIS — J0100 Acute maxillary sinusitis, unspecified: Secondary | ICD-10-CM | POA: Diagnosis not present

## 2017-08-04 MED ORDER — AMOXICILLIN-POT CLAVULANATE 875-125 MG PO TABS: 1.0000 | ORAL_TABLET | Freq: Two times a day (BID) | ORAL | 0 refills | Status: DC

## 2017-08-04 NOTE — Progress Notes (Signed)
She had a cold, was getting better, then go sick again in the meantime.  Cough, thick sputum, yellow/green.  Rhinorrhea.  No fevers.  Worse over the last 10 days.    Meds, vitals, and allergies reviewed.   ROS: Per HPI unless specifically indicated in ROS section   GEN: nad, alert and oriented HEENT: mucous membranes moist, tm w/o erythema, nasal exam w/o erythema, clear discharge noted,  OP with cobblestoning NECK: supple w/o LA CV: rrr.   PULM: ctab, no inc wob EXT: no edema Sinuses ttp x4

## 2017-08-04 NOTE — Patient Instructions (Signed)
Rest and fluids.  Start augmentin.  Update me as needed.  Take care.  Glad to see you.

## 2017-08-06 NOTE — Assessment & Plan Note (Signed)
Rest and fluids.  Start augmentin.  Update me as needed.  Nontoxic.  Okay for outpatient follow-up.  She agrees.

## 2017-09-07 DIAGNOSIS — M25531 Pain in right wrist: Secondary | ICD-10-CM | POA: Diagnosis not present

## 2017-09-11 DIAGNOSIS — R69 Illness, unspecified: Secondary | ICD-10-CM | POA: Diagnosis not present

## 2017-09-14 DIAGNOSIS — M25531 Pain in right wrist: Secondary | ICD-10-CM | POA: Diagnosis not present

## 2017-10-09 ENCOUNTER — Encounter: Payer: Self-pay | Admitting: Family Medicine

## 2017-10-10 ENCOUNTER — Telehealth: Payer: Self-pay | Admitting: Family Medicine

## 2017-10-10 ENCOUNTER — Encounter: Payer: Self-pay | Admitting: Primary Care

## 2017-10-10 ENCOUNTER — Ambulatory Visit (INDEPENDENT_AMBULATORY_CARE_PROVIDER_SITE_OTHER): Payer: Medicare HMO | Admitting: Primary Care

## 2017-10-10 VITALS — BP 124/82 | HR 71 | Temp 98.1°F | Ht <= 58 in | Wt 162.0 lb

## 2017-10-10 DIAGNOSIS — J4541 Moderate persistent asthma with (acute) exacerbation: Secondary | ICD-10-CM

## 2017-10-10 DIAGNOSIS — J069 Acute upper respiratory infection, unspecified: Secondary | ICD-10-CM

## 2017-10-10 MED ORDER — PREDNISONE 20 MG PO TABS: ORAL_TABLET | ORAL | 0 refills | Status: DC

## 2017-10-10 MED ORDER — BENZONATATE 200 MG PO CAPS: 200.0000 mg | ORAL_CAPSULE | Freq: Three times a day (TID) | ORAL | 0 refills | Status: DC | PRN

## 2017-10-10 MED ORDER — AZITHROMYCIN 250 MG PO TABS: ORAL_TABLET | ORAL | 0 refills | Status: DC

## 2017-10-10 NOTE — Patient Instructions (Signed)
Start Azithromycin antibiotics for infection. Take 2 tablets by mouth today, then 1 tablet daily for 4 additional days.  You may take Benzonatate capsules for cough. Take 1 capsule by mouth three times daily as needed for cough.  Start prednisone tablets. Take 2 tablets once daily for 5 days.  Shortness of Breath/Wheezing/Cough: Use the albuterol inhaler. Inhale 2 puffs into the lungs every 4 to six hours as needed for wheezing, cough, and/or shortness of breath.   Continue Flovent Daily as prescribed.  Make sure to stay hydrated with water and rest.  It was a pleasure meeting you!

## 2017-10-10 NOTE — Telephone Encounter (Signed)
Pharmacy received prednisone 20 mg prescription, it is on back order, would like to know if they can change the miligrams and directions.   Last OV:10/10/17 with Allie Bossier (medication ordered) Pharmacy: [facility] [address], [place] - [place] [phone] (Phone) [phone] (Fax)

## 2017-10-10 NOTE — Telephone Encounter (Signed)
Copied from [place] [id number]. Topic: Quick Communication - See Telephone Encounter >> Oct 10, 2017  9:40 AM Hewitt Shorts wrote: Katie [name] on [address] received prednisone 20mg  that is on back order and is needing to know if they can change the miligrams and directions   Best number [phone]  Pt is in the store waiting
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
## 2017-10-11 MED ORDER — PREDNISONE 10 MG PO TABS: ORAL_TABLET | ORAL | 0 refills | Status: DC

## 2017-10-11 NOTE — Telephone Encounter (Signed)
Yes, okay to switch. New Rx sent to pharmacy.

## 2017-10-18 ENCOUNTER — Encounter: Payer: Self-pay | Admitting: Family Medicine

## 2017-10-18 ENCOUNTER — Telehealth: Payer: Self-pay | Admitting: Family Medicine

## 2017-10-18 NOTE — Telephone Encounter (Signed)
Please notify patient that I'm glad to hear that the chest congestion has improved. The antibiotic I provided her will continue to work for 10 days after she finishes the pills. It's common to have residual symptoms after an infection, and in some cases, these symptoms can last up to 4 weeks. Continue the flonase, start an antihistamine such as Claritin, Zyrtec, Allegra. Have her see PCP in 1 week if no improvement or sooner if she develops fevers.

## 2017-10-18 NOTE — Telephone Encounter (Signed)
Copied from [place] [id number]. Topic: Quick Communication - See Telephone Encounter >> Oct 18, 2017 12:02 PM Ether Griffins B wrote: CRM for notification. See Telephone encounter for: 10/18/17.  Pt is calling in regards to the mychart message she sent today. She was in the office on 10/10/17 and seen by Anda Kraft. She was given an zpack and finished it on 10/14/17. It has cleared everything out of her chest. But she is still having a lot of head congestion. She feels like her head is in a fog. She is wondering if another antibiotic can be called in or if she needs to come in and be re evaluated.

## 2017-10-18 NOTE — Telephone Encounter (Signed)
Spoken and notified patient of Kate Clark's comments. Patient verbalized understanding.  

## 2017-10-19 ENCOUNTER — Telehealth: Payer: Self-pay | Admitting: Family Medicine

## 2017-10-19 DIAGNOSIS — J069 Acute upper respiratory infection, unspecified: Secondary | ICD-10-CM

## 2017-10-19 DIAGNOSIS — J4541 Moderate persistent asthma with (acute) exacerbation: Secondary | ICD-10-CM

## 2017-10-19 MED ORDER — PREDNISONE 10 MG PO TABS: ORAL_TABLET | ORAL | 0 refills | Status: DC

## 2017-10-19 NOTE — Telephone Encounter (Signed)
Please call pt and get clarification on remaining symptoms- discolored rhinorrhea, fever, breathing sx, cough, etc.  Let me know. Thanks.

## 2017-10-19 NOTE — Telephone Encounter (Signed)
Patient states she has been on 2 rounds of Prednisone recently for gout episodes and she doesn't really think she should go on it again.  Patient states that she will continue to use OTC meds and may ask for a refill of the Benzonatate if she needs it.

## 2017-10-19 NOTE — Telephone Encounter (Signed)
Patient says she is not getting discolored mucous from the nose.  The problem is mostly the cough.  She is able to cough up phlegm but it seems to be mostly white.  She is no longer wheezing with breathing and has no fever but states "she never runs a fever".  No other symptoms that the patient wishes to report except that she is sick and tired of pollen and these allergens.

## 2017-10-19 NOTE — Telephone Encounter (Signed)
I would retry the prednisone with food. If fever or discolored rhinorrhea or facial pain then let me know.  Thanks.  rx sent.

## 2017-10-20 MED ORDER — BENZONATATE 200 MG PO CAPS: 200.0000 mg | ORAL_CAPSULE | Freq: Three times a day (TID) | ORAL | 1 refills | Status: DC | PRN

## 2017-10-20 NOTE — Telephone Encounter (Signed)
Noted.  I sent the benzonatate refill in the meantime just so it would be there.  Thanks.

## 2017-11-06 DIAGNOSIS — Z08 Encounter for follow-up examination after completed treatment for malignant neoplasm: Secondary | ICD-10-CM | POA: Diagnosis not present

## 2017-11-06 DIAGNOSIS — R21 Rash and other nonspecific skin eruption: Secondary | ICD-10-CM | POA: Diagnosis not present

## 2017-11-06 DIAGNOSIS — Z9011 Acquired absence of right breast and nipple: Secondary | ICD-10-CM | POA: Diagnosis not present

## 2017-11-06 DIAGNOSIS — Z1231 Encounter for screening mammogram for malignant neoplasm of breast: Secondary | ICD-10-CM | POA: Diagnosis not present

## 2017-11-06 DIAGNOSIS — C50911 Malignant neoplasm of unspecified site of right female breast: Secondary | ICD-10-CM | POA: Diagnosis not present

## 2017-11-06 DIAGNOSIS — N952 Postmenopausal atrophic vaginitis: Secondary | ICD-10-CM | POA: Diagnosis not present

## 2017-11-06 DIAGNOSIS — Z853 Personal history of malignant neoplasm of breast: Secondary | ICD-10-CM | POA: Diagnosis not present

## 2017-11-06 DIAGNOSIS — Z170 Estrogen receptor positive status [ER+]: Secondary | ICD-10-CM | POA: Diagnosis not present

## 2017-11-06 LAB — HM MAMMOGRAPHY

## 2017-11-20 ENCOUNTER — Other Ambulatory Visit (INDEPENDENT_AMBULATORY_CARE_PROVIDER_SITE_OTHER): Payer: Medicare HMO

## 2017-11-20 DIAGNOSIS — E119 Type 2 diabetes mellitus without complications: Secondary | ICD-10-CM

## 2017-11-20 DIAGNOSIS — M8580 Other specified disorders of bone density and structure, unspecified site: Secondary | ICD-10-CM

## 2017-11-20 LAB — VITAMIN D 25 HYDROXY (VIT D DEFICIENCY, FRACTURES): VITD: 18.07 ng/mL — AB (ref 30.00–100.00)

## 2017-11-20 LAB — HEMOGLOBIN A1C: Hgb A1c MFr Bld: 7.1 % — ABNORMAL HIGH (ref 4.6–6.5)

## 2017-11-23 ENCOUNTER — Encounter: Payer: Self-pay | Admitting: Family Medicine

## 2017-11-24 ENCOUNTER — Other Ambulatory Visit: Payer: Self-pay | Admitting: Family Medicine

## 2017-11-24 MED ORDER — NYSTATIN 100000 UNIT/ML MT SUSP: 5.0000 mL | Freq: Four times a day (QID) | OROMUCOSAL | 1 refills | Status: DC

## 2017-11-27 ENCOUNTER — Other Ambulatory Visit: Payer: Self-pay | Admitting: Family Medicine

## 2017-11-27 DIAGNOSIS — E559 Vitamin D deficiency, unspecified: Secondary | ICD-10-CM

## 2017-11-27 DIAGNOSIS — E119 Type 2 diabetes mellitus without complications: Secondary | ICD-10-CM

## 2017-11-27 MED ORDER — VITAMIN D (ERGOCALCIFEROL) 1.25 MG (50000 UNIT) PO CAPS: 50000.0000 [IU] | ORAL_CAPSULE | ORAL | 0 refills | Status: DC

## 2018-01-12 ENCOUNTER — Telehealth: Payer: Self-pay | Admitting: *Deleted

## 2018-01-12 NOTE — Telephone Encounter (Signed)
Received fax from [place], [employer] saying that Estradiol 0.01% Cream is on backorder.

## 2018-01-12 NOTE — Telephone Encounter (Signed)
Does the pharmacy have any other substitute in stock that is similar and could be used in the meantime?

## 2018-01-12 NOTE — Telephone Encounter (Signed)
Note faxed to pharmacy, awaiting response.

## 2018-01-15 NOTE — Telephone Encounter (Signed)
[facility] says that over the weekend, patient had the Rx transferred to [facility] who must have had the medication in stock.

## 2018-01-30 DIAGNOSIS — D1801 Hemangioma of skin and subcutaneous tissue: Secondary | ICD-10-CM | POA: Diagnosis not present

## 2018-01-30 DIAGNOSIS — L821 Other seborrheic keratosis: Secondary | ICD-10-CM | POA: Diagnosis not present

## 2018-02-26 ENCOUNTER — Other Ambulatory Visit (INDEPENDENT_AMBULATORY_CARE_PROVIDER_SITE_OTHER): Payer: Medicare HMO

## 2018-02-26 DIAGNOSIS — E559 Vitamin D deficiency, unspecified: Secondary | ICD-10-CM

## 2018-02-26 DIAGNOSIS — E119 Type 2 diabetes mellitus without complications: Secondary | ICD-10-CM | POA: Diagnosis not present

## 2018-02-26 LAB — HEMOGLOBIN A1C: Hgb A1c MFr Bld: 6.9 % — ABNORMAL HIGH (ref 4.6–6.5)

## 2018-02-26 LAB — VITAMIN D 25 HYDROXY (VIT D DEFICIENCY, FRACTURES): VITD: 25.33 ng/mL — ABNORMAL LOW (ref 30.00–100.00)

## 2018-03-01 ENCOUNTER — Ambulatory Visit (INDEPENDENT_AMBULATORY_CARE_PROVIDER_SITE_OTHER): Payer: Medicare HMO | Admitting: Family Medicine

## 2018-03-01 ENCOUNTER — Encounter: Payer: Self-pay | Admitting: Family Medicine

## 2018-03-01 VITALS — BP 110/78 | HR 88 | Temp 98.5°F | Ht <= 58 in | Wt 167.5 lb

## 2018-03-01 DIAGNOSIS — E119 Type 2 diabetes mellitus without complications: Secondary | ICD-10-CM | POA: Diagnosis not present

## 2018-03-01 DIAGNOSIS — Z23 Encounter for immunization: Secondary | ICD-10-CM

## 2018-03-01 DIAGNOSIS — E559 Vitamin D deficiency, unspecified: Secondary | ICD-10-CM | POA: Diagnosis not present

## 2018-03-01 DIAGNOSIS — M109 Gout, unspecified: Secondary | ICD-10-CM

## 2018-03-01 MED ORDER — VITAMIN D (ERGOCALCIFEROL) 1.25 MG (50000 UNIT) PO CAPS: 50000.0000 [IU] | ORAL_CAPSULE | ORAL | 0 refills | Status: DC

## 2018-03-01 NOTE — Progress Notes (Signed)
Diabetes:  No meds Hypoglycemic episodes: no sx Hyperglycemic episodes:no sx Feet problems:no Blood Sugars averaging: not checked.  eye exam within last year: due, d/w pt.   A1c at goal at 6.9.    She has some L sided neck pain occ.  No bruit on exam.  Her pain is near the L SCM.  She'll observe and update me as needed.   Mammogram done 11/2017, normal, done at [facility].    EXAM: MAMMO SCREEN BREAST WITH TOMOSYNTHESIS LEFT 6/3/20199:40 AM  INDICATION: Screening  COMPARISON: Compared to: 11/03/2016 Mammo screening digital left, 11/03/2016 Mammo  diagnostic digital left, and 11/03/2015 Mammo screening digital left   TECHNIQUE: Tomosynthesis images were obtained as part of this exam.  FINDINGS: The breast has scattered areas of fibroglandular density.  There are no significant masses, calcifications, or other findings in the  left breast. ================================= Vit D improved, but still low.  D/w pt.    Her brother died earlier this year, then she had a friend die this summer.  Then her brother in law recently died.  Her granddaughter has been bullied and is in treatment for her mood.  The patient has a lot going on. Support offered.  She is trying to work through the situation, she is managing with great effort on her part.  "I'm hanging in there."    She likely had a gout flare after her brother died.  She saw ortho about it.  She took prednisone and colchicine.  She had GI upset.  Allergy list updated. She stopped meloxicam and changed to ibuprofen.  Sx resolved in the meantime.  nsaid cautions d/w pt.    Meds, vitals, and allergies reviewed.  ROS: Per HPI unless specifically indicated in ROS section   GEN: nad, alert and oriented HEENT: mucous membranes moist NECK: supple w/o LA CV: rrr. PULM: ctab, no inc wob ABD: soft, +bs EXT: no edema SKIN: no acute rash

## 2018-03-01 NOTE — Patient Instructions (Addendum)
Restart vitamin D and recheck that with labs in about 3 months prior to a physical.   Update me as needed.  Take care.  Glad to see you.

## 2018-03-02 DIAGNOSIS — E559 Vitamin D deficiency, unspecified: Secondary | ICD-10-CM | POA: Insufficient documentation

## 2018-03-02 DIAGNOSIS — M109 Gout, unspecified: Secondary | ICD-10-CM | POA: Insufficient documentation

## 2018-03-02 NOTE — Assessment & Plan Note (Signed)
Restart vitamin D and recheck that with labs in about 3 months prior to a physical.  See orders.  Labs discussed with patient.  She agrees.

## 2018-03-02 NOTE — Assessment & Plan Note (Signed)
No medication currently. A1c at goal at 6.9.  Labs discussed with patient.  Recheck periodically.  Continue work on diet and exercise.  She is trying to deal with multiple stressors and still manage her weight, carbohydrate intake, exercise, etc.  I appreciate her effort.  Discussed.

## 2018-03-02 NOTE — Assessment & Plan Note (Signed)
She likely had a gout flare after her brother died.  She saw ortho about it.  She took prednisone and colchicine.  She had GI upset with colchicine.  Allergy list updated. She stopped meloxicam and changed to ibuprofen.  Sx resolved in the meantime.  nsaid cautions d/w pt.

## 2018-03-14 ENCOUNTER — Emergency Department: Payer: Medicare HMO

## 2018-03-14 ENCOUNTER — Emergency Department
Admission: EM | Admit: 2018-03-14 | Discharge: 2018-03-15 | Disposition: A | Discharge status: Home / Self Care | Payer: Medicare HMO | Attending: Emergency Medicine | Admitting: Emergency Medicine

## 2018-03-14 DIAGNOSIS — Z853 Personal history of malignant neoplasm of breast: Secondary | ICD-10-CM | POA: Insufficient documentation

## 2018-03-14 DIAGNOSIS — E119 Type 2 diabetes mellitus without complications: Secondary | ICD-10-CM | POA: Diagnosis not present

## 2018-03-14 DIAGNOSIS — R6884 Jaw pain: Secondary | ICD-10-CM | POA: Insufficient documentation

## 2018-03-14 DIAGNOSIS — J45909 Unspecified asthma, uncomplicated: Secondary | ICD-10-CM | POA: Diagnosis not present

## 2018-03-14 DIAGNOSIS — R079 Chest pain, unspecified: Secondary | ICD-10-CM | POA: Diagnosis not present

## 2018-03-14 DIAGNOSIS — Z79899 Other long term (current) drug therapy: Secondary | ICD-10-CM | POA: Insufficient documentation

## 2018-03-14 DIAGNOSIS — M542 Cervicalgia: Secondary | ICD-10-CM | POA: Insufficient documentation

## 2018-03-14 DIAGNOSIS — R0602 Shortness of breath: Secondary | ICD-10-CM | POA: Diagnosis not present

## 2018-03-14 DIAGNOSIS — R0789 Other chest pain: Secondary | ICD-10-CM | POA: Diagnosis not present

## 2018-03-14 DIAGNOSIS — I10 Essential (primary) hypertension: Secondary | ICD-10-CM | POA: Insufficient documentation

## 2018-03-14 DIAGNOSIS — Z7982 Long term (current) use of aspirin: Secondary | ICD-10-CM | POA: Diagnosis not present

## 2018-03-14 DIAGNOSIS — R69 Illness, unspecified: Secondary | ICD-10-CM | POA: Diagnosis not present

## 2018-03-14 LAB — CBC
HEMATOCRIT: 39.9 % (ref 36.0–46.0)
Hemoglobin: 12.9 g/dL (ref 12.0–15.0)
MCH: 29.5 pg (ref 26.0–34.0)
MCHC: 32.3 g/dL (ref 30.0–36.0)
MCV: 91.3 fL (ref 80.0–100.0)
NRBC: 0 % (ref 0.0–0.2)
PLATELETS: 322 10*3/uL (ref 150–400)
RBC: 4.37 MIL/uL (ref 3.87–5.11)
RDW: 14.6 % (ref 11.5–15.5)
WBC: 9.2 10*3/uL (ref 4.0–10.5)

## 2018-03-14 LAB — TROPONIN I
Troponin I: <0.03 ng/mL (ref <0.03)
Troponin I: <0.03 ng/mL (ref <0.03)

## 2018-03-14 LAB — BASIC METABOLIC PANEL
Anion gap: 11 (ref 5–15)
BUN: 22 mg/dL (ref 8–23)
CALCIUM: 9.3 mg/dL (ref 8.9–10.3)
CHLORIDE: 103 mmol/L (ref 98–111)
CO2: 22 mmol/L (ref 22–32)
CREATININE: 1.29 mg/dL — AB (ref 0.44–1.00)
GFR calc non Af Amer: 41 mL/min — ABNORMAL LOW (ref >60)
GFR, EST AFRICAN AMERICAN: 47 mL/min — AB (ref >60)
GLUCOSE: 215 mg/dL — AB (ref 70–99)
Potassium: 4.2 mmol/L (ref 3.5–5.1)
Sodium: 136 mmol/L (ref 135–145)

## 2018-03-14 LAB — FIBRIN DERIVATIVES D-DIMER (ARMC ONLY): FIBRIN DERIVATIVES D-DIMER (ARMC): 1161.65 ng{FEU}/mL — AB (ref 0.00–499.00)

## 2018-03-14 MED ORDER — IOHEXOL 350 MG/ML SOLN
60.0000 mL | Freq: Once | INTRAVENOUS | Status: AC | PRN
  Administered 2018-03-14: 60 mL via INTRAVENOUS

## 2018-03-14 NOTE — ED Notes (Signed)
Patient transported to CT at this time. 
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
## 2018-03-14 NOTE — ED Triage Notes (Signed)
Patient c/o medial chest pain, radiating to jaw at 1830. Patient reports accompany symptom of SOB. Patient took 1 325 mg aspirin.

## 2018-03-15 DIAGNOSIS — R079 Chest pain, unspecified: Secondary | ICD-10-CM | POA: Diagnosis not present

## 2018-03-15 DIAGNOSIS — R0602 Shortness of breath: Secondary | ICD-10-CM | POA: Diagnosis not present

## 2018-03-15 NOTE — ED Notes (Signed)
Unable to obtain e-signature d/t downtime; pt signed paper copy for scanning.

## 2018-03-15 NOTE — Discharge Instructions (Signed)
Please seek medical attention for any high fevers, chest pain, shortness of breath, change in behavior, persistent vomiting, bloody stool or any other new or concerning symptoms.  

## 2018-03-16 ENCOUNTER — Encounter: Payer: Self-pay | Admitting: Family Medicine

## 2018-03-16 ENCOUNTER — Ambulatory Visit: Payer: Medicare HMO | Admitting: Family Medicine

## 2018-03-16 ENCOUNTER — Other Ambulatory Visit: Payer: Self-pay | Admitting: *Deleted

## 2018-03-16 ENCOUNTER — Ambulatory Visit (INDEPENDENT_AMBULATORY_CARE_PROVIDER_SITE_OTHER): Payer: Medicare HMO | Admitting: Family Medicine

## 2018-03-16 DIAGNOSIS — R0789 Other chest pain: Secondary | ICD-10-CM | POA: Diagnosis not present

## 2018-03-16 MED ORDER — NITROGLYCERIN 0.4 MG SL SUBL: 0.4000 mg | SUBLINGUAL_TABLET | Freq: Once | SUBLINGUAL | 0 refills | Status: DC

## 2018-03-16 MED ORDER — FLUTICASONE PROPIONATE HFA 110 MCG/ACT IN AERO: 2.0000 | INHALATION_SPRAY | Freq: Two times a day (BID) | RESPIRATORY_TRACT | 12 refills | Status: DC

## 2018-03-16 NOTE — Progress Notes (Signed)
ER f/u.  She went in with CP.  She was drinking ice water and then all of a sudden she was having chest pain and pressure and L jaw pain.  She took an aspirin and then went to ER.    She wasn't choking.  She had enough pain to keep her from trying to drink more water.    D dimer elevated, but CT neg for PE.   She is here for follow-up.  No symptoms in the meantime.  Swallowing normally.  No fevers chills throwing up.  She feels back to baseline.  PMH and SH reviewed  ROS: Per HPI unless specifically indicated in ROS section   Meds, vitals, and allergies reviewed.   GEN: nad, alert and oriented HEENT: mucous membranes moist NECK: supple w/o LA CV: rrr PULM: ctab, no inc wob ABD: soft, +bs EXT: no edema SKIN: no acute rash

## 2018-03-16 NOTE — Patient Instructions (Signed)
Presumed esophageal spasm.  If you have another episode, take nitroglycerin once.   Keep taking prilosec.  If anymore symptoms, then we need to get you set up with GI.  If you are willing to go in the meantime, then let me know.  Take care.  Glad to see you.

## 2018-03-18 DIAGNOSIS — R0789 Other chest pain: Secondary | ICD-10-CM | POA: Insufficient documentation

## 2018-03-18 NOTE — Assessment & Plan Note (Addendum)
This came on at rest.  She does not have exertional symptoms.  She does not have symptoms in the meantime.  I question if she could have had an esophageal spasm.  Discussed the differential diagnosis.  She is already on PPI.  D/w pt that it makes sense to continue PPI for now.  She had a CT that was negative for PE.  This is not thought to be a cardiac issue.  D/w pt about GI eval for possible EGD.  Encouraged GI eval.  She wanted to defer.   She thought he had prev esophageal scar tissue from prev prolonged NG tube and from ventilation tube placement.  We talked about this thoroughly and she wanted to put off GI evaluation for now.  Routine cautions given.  She will update me as needed. >25 minutes spent in face to face time with patient, >50% spent in counselling or coordination of care

## 2018-03-21 ENCOUNTER — Encounter: Payer: Self-pay | Admitting: Family Medicine

## 2018-03-22 ENCOUNTER — Telehealth: Payer: Self-pay | Admitting: Family Medicine

## 2018-03-22 NOTE — Telephone Encounter (Signed)
I spoke with pt; pt had new break out of hives on 03/21/18; today pt thinks the hives and itching  are  Better today;no new hives seen today; hives are from neck to legs; the Benadryl and Zyrtec has helped the itching. Pt said the itching started on Thursday night and the hives began after that.No SOB or tongue, mouth or tongue swelling. Pt does not think since she is much better this morning and does not want to schedule appt. Pt is aware if hives and itching worsens pt will call [facility] for appt. If any swelling of lips, tongue,mouth or throat or SOB pt will go to ED immediately. Pt is also going to try to stay cool, wear loose clothing and not bathe with very warm water. Pt said if Dr Damita Dunnings has any further instructions to please let pt know; otherwise pt will continue doing what she has been doing and will cb if needed. FYI to Dr Damita Dunnings.

## 2018-03-22 NOTE — Telephone Encounter (Signed)
Please call in triage patient.  It may be that she truly has a contrast dye allergy.  I added that to her allergy list in the meantime.  Thanks.

## 2018-03-23 NOTE — Telephone Encounter (Signed)
Pt notified as instructed and pt voiced understanding. Pt said more of the hives have disappeared but pt still itching. Pt continues with the Benadryl or Zyrtec which helps some but pt has started using the Vaseline intensive care spray and that is helping the itching the most. Pt will continue doing what she is doing and was reminded about ED precautions. Pt said if itching has not gone away when all the hives have left pt will call for appt. Nothing further needed at this time.

## 2018-03-23 NOTE — Telephone Encounter (Addendum)
Noted, agreed.  If she had any residual symptoms or concerns then it makes sense for her to get seen but otherwise just continue as is.  Her allergy list is updated.  Thanks for checking on her.

## 2018-05-01 ENCOUNTER — Encounter: Payer: Self-pay | Admitting: Family Medicine

## 2018-05-02 ENCOUNTER — Ambulatory Visit (INDEPENDENT_AMBULATORY_CARE_PROVIDER_SITE_OTHER): Payer: Medicare HMO | Admitting: Primary Care

## 2018-05-02 ENCOUNTER — Other Ambulatory Visit: Payer: Self-pay | Admitting: Family Medicine

## 2018-05-02 ENCOUNTER — Encounter: Payer: Self-pay | Admitting: Primary Care

## 2018-05-02 DIAGNOSIS — B029 Zoster without complications: Secondary | ICD-10-CM | POA: Diagnosis not present

## 2018-05-02 DIAGNOSIS — J019 Acute sinusitis, unspecified: Secondary | ICD-10-CM | POA: Diagnosis not present

## 2018-05-02 MED ORDER — AMOXICILLIN-POT CLAVULANATE 875-125 MG PO TABS: 1.0000 | ORAL_TABLET | Freq: Two times a day (BID) | ORAL | 0 refills | Status: DC

## 2018-05-02 MED ORDER — VALACYCLOVIR HCL 1 G PO TABS: 1000.0000 mg | ORAL_TABLET | Freq: Three times a day (TID) | ORAL | 0 refills | Status: DC

## 2018-05-02 NOTE — Assessment & Plan Note (Signed)
Obvious presentation on exam. Given increased stress at home this is very likely the cause.  Rx for valacyclovir course provided today. Given proximity to right eye (no obvious ocular involvement), recommended eye evaluation. She does have an eye doctor and will call today.   Return precautions provided.

## 2018-05-02 NOTE — Patient Instructions (Signed)
Start valacyclovir 1000 mg tablets for shingles. Take 1 tablet by mouth three times daily for 7 days.  Start Augmentin antibiotics for the infection Take 1 tablet by mouth twice daily for 10 days.  Please call your eye doctor today and report that you have shingles to the face.  It was a pleasure to see you today!  s
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
## 2018-05-02 NOTE — Assessment & Plan Note (Signed)
Sinus pressure, nasal congestion, cough x 2 weeks.  Exam today consistent for acute sinusitis.  Attempted to send in Augmentin course and after further char review noted that PCP has already done this. Agree with plan. Continue Flonase, Tessalon Perles. Return precautions provided.

## 2018-05-08 DIAGNOSIS — E119 Type 2 diabetes mellitus without complications: Secondary | ICD-10-CM | POA: Diagnosis not present

## 2018-05-08 LAB — HM DIABETES EYE EXAM

## 2018-05-10 DIAGNOSIS — Z8673 Personal history of transient ischemic attack (TIA), and cerebral infarction without residual deficits: Secondary | ICD-10-CM | POA: Diagnosis not present

## 2018-05-10 DIAGNOSIS — R52 Pain, unspecified: Secondary | ICD-10-CM | POA: Diagnosis not present

## 2018-05-10 DIAGNOSIS — Z9889 Other specified postprocedural states: Secondary | ICD-10-CM | POA: Diagnosis not present

## 2018-05-10 DIAGNOSIS — I6523 Occlusion and stenosis of bilateral carotid arteries: Secondary | ICD-10-CM | POA: Diagnosis not present

## 2018-05-10 DIAGNOSIS — M4804 Spinal stenosis, thoracic region: Secondary | ICD-10-CM | POA: Diagnosis not present

## 2018-05-10 DIAGNOSIS — R1084 Generalized abdominal pain: Secondary | ICD-10-CM | POA: Diagnosis not present

## 2018-05-10 DIAGNOSIS — M7989 Other specified soft tissue disorders: Secondary | ICD-10-CM | POA: Diagnosis not present

## 2018-05-10 DIAGNOSIS — R000 Tachycardia, unspecified: Secondary | ICD-10-CM | POA: Diagnosis not present

## 2018-05-10 DIAGNOSIS — Z041 Encounter for examination and observation following transport accident: Secondary | ICD-10-CM | POA: Diagnosis not present

## 2018-05-10 DIAGNOSIS — S301XXA Contusion of abdominal wall, initial encounter: Secondary | ICD-10-CM | POA: Diagnosis not present

## 2018-05-10 DIAGNOSIS — M160 Bilateral primary osteoarthritis of hip: Secondary | ICD-10-CM | POA: Diagnosis not present

## 2018-05-10 DIAGNOSIS — M2011 Hallux valgus (acquired), right foot: Secondary | ICD-10-CM | POA: Diagnosis not present

## 2018-05-10 DIAGNOSIS — W19XXXA Unspecified fall, initial encounter: Secondary | ICD-10-CM | POA: Diagnosis not present

## 2018-05-10 DIAGNOSIS — M85842 Other specified disorders of bone density and structure, left hand: Secondary | ICD-10-CM | POA: Diagnosis not present

## 2018-05-10 DIAGNOSIS — Q644 Malformation of urachus: Secondary | ICD-10-CM | POA: Diagnosis not present

## 2018-05-10 DIAGNOSIS — S22069A Unspecified fracture of T7-T8 vertebra, initial encounter for closed fracture: Secondary | ICD-10-CM | POA: Diagnosis not present

## 2018-05-10 DIAGNOSIS — Y92410 Unspecified street and highway as the place of occurrence of the external cause: Secondary | ICD-10-CM | POA: Diagnosis not present

## 2018-05-10 DIAGNOSIS — Z7982 Long term (current) use of aspirin: Secondary | ICD-10-CM | POA: Diagnosis not present

## 2018-05-10 DIAGNOSIS — M47812 Spondylosis without myelopathy or radiculopathy, cervical region: Secondary | ICD-10-CM | POA: Diagnosis not present

## 2018-05-10 DIAGNOSIS — I10 Essential (primary) hypertension: Secondary | ICD-10-CM | POA: Diagnosis not present

## 2018-05-10 DIAGNOSIS — Y998 Other external cause status: Secondary | ICD-10-CM | POA: Diagnosis not present

## 2018-05-10 DIAGNOSIS — M79642 Pain in left hand: Secondary | ICD-10-CM | POA: Diagnosis not present

## 2018-05-11 DIAGNOSIS — I452 Bifascicular block: Secondary | ICD-10-CM | POA: Diagnosis not present

## 2018-05-11 DIAGNOSIS — S301XXA Contusion of abdominal wall, initial encounter: Secondary | ICD-10-CM | POA: Diagnosis not present

## 2018-05-11 DIAGNOSIS — R000 Tachycardia, unspecified: Secondary | ICD-10-CM | POA: Diagnosis not present

## 2018-05-11 MED ORDER — POLYETHYLENE GLYCOL 3350 17 G PO PACK
17.00 | PACK | ORAL | Status: DC
Start: 2018-05-12 — End: 2018-05-11

## 2018-05-11 MED ORDER — ENOXAPARIN SODIUM 40 MG/0.4ML ~~LOC~~ SOLN
0.60 | SUBCUTANEOUS | Status: DC
Start: 2018-05-11 — End: 2018-05-11

## 2018-05-11 MED ORDER — GABAPENTIN 100 MG PO CAPS
100.00 | ORAL_CAPSULE | ORAL | Status: DC
Start: 2018-05-11 — End: 2018-05-11

## 2018-05-11 MED ORDER — ACETAMINOPHEN 325 MG PO TABS
325.00 | ORAL_TABLET | ORAL | Status: DC
Start: 2018-05-11 — End: 2018-05-11

## 2018-05-11 MED ORDER — SENNOSIDES-DOCUSATE SODIUM 8.6-50 MG PO TABS
2.00 | ORAL_TABLET | ORAL | Status: DC
Start: 2018-05-11 — End: 2018-05-11

## 2018-05-13 ENCOUNTER — Encounter: Payer: Self-pay | Admitting: Family Medicine

## 2018-05-13 ENCOUNTER — Telehealth: Payer: Self-pay | Admitting: Family Medicine

## 2018-05-13 DIAGNOSIS — N3289 Other specified disorders of bladder: Secondary | ICD-10-CM
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
## 2018-05-14 NOTE — Telephone Encounter (Signed)
Patient says she is sore and bruised and itchy from the contrast even though they gave her steroids prior.    Patient was advised about the incidental finding and has not seen a urologist in many years and would like a referral.

## 2018-05-16 NOTE — Addendum Note (Signed)
Addended by: [name] on: 05/16/2018 10:38 AM   Modules accepted: Orders

## 2018-05-16 NOTE — Telephone Encounter (Signed)
Patient notified by telephone that referral has been placed and she will hear back from one of the referral coordinators.

## 2018-05-16 NOTE — Telephone Encounter (Signed)
Referral placed  Thanks!

## 2018-05-25 ENCOUNTER — Encounter: Payer: Self-pay | Admitting: Family Medicine

## 2018-06-08 ENCOUNTER — Other Ambulatory Visit: Payer: Self-pay | Admitting: Family Medicine

## 2018-06-08 ENCOUNTER — Other Ambulatory Visit (INDEPENDENT_AMBULATORY_CARE_PROVIDER_SITE_OTHER): Payer: Medicare HMO

## 2018-06-08 DIAGNOSIS — E119 Type 2 diabetes mellitus without complications: Secondary | ICD-10-CM

## 2018-06-08 DIAGNOSIS — E559 Vitamin D deficiency, unspecified: Secondary | ICD-10-CM

## 2018-06-08 LAB — LIPID PANEL
CHOL/HDL RATIO: 4
CHOLESTEROL: 192 mg/dL (ref 0–200)
HDL: 52 mg/dL (ref >39.00)
LDL CALC: 104 mg/dL — AB (ref 0–99)
NonHDL: 140.31
TRIGLYCERIDES: 184 mg/dL — AB (ref 0.0–149.0)
VLDL: 36.8 mg/dL (ref 0.0–40.0)

## 2018-06-08 LAB — COMPREHENSIVE METABOLIC PANEL
ALBUMIN: 4 g/dL (ref 3.5–5.2)
ALT: 10 U/L (ref 0–35)
AST: 14 U/L (ref 0–37)
Alkaline Phosphatase: 65 U/L (ref 39–117)
BUN: 21 mg/dL (ref 6–23)
CALCIUM: 9.2 mg/dL (ref 8.4–10.5)
CHLORIDE: 104 meq/L (ref 96–112)
CO2: 25 meq/L (ref 19–32)
CREATININE: 0.88 mg/dL (ref 0.40–1.20)
GFR: 67.24 mL/min (ref >60.00)
Glucose, Bld: 112 mg/dL — ABNORMAL HIGH (ref 70–99)
POTASSIUM: 4 meq/L (ref 3.5–5.1)
Sodium: 139 mEq/L (ref 135–145)
Total Bilirubin: 0.3 mg/dL (ref 0.2–1.2)
Total Protein: 7.1 g/dL (ref 6.0–8.3)

## 2018-06-08 LAB — VITAMIN D 25 HYDROXY (VIT D DEFICIENCY, FRACTURES): VITD: 35.06 ng/mL (ref 30.00–100.00)

## 2018-06-08 LAB — HEMOGLOBIN A1C: Hgb A1c MFr Bld: 6 % (ref 4.6–6.5)

## 2018-06-12 ENCOUNTER — Encounter: Payer: Self-pay | Admitting: Family Medicine

## 2018-06-12 ENCOUNTER — Other Ambulatory Visit: Payer: Medicare HMO

## 2018-06-12 ENCOUNTER — Ambulatory Visit: Payer: Medicare HMO

## 2018-06-12 ENCOUNTER — Ambulatory Visit (INDEPENDENT_AMBULATORY_CARE_PROVIDER_SITE_OTHER): Payer: Medicare HMO | Admitting: Family Medicine

## 2018-06-12 VITALS — BP 142/80 | HR 88 | Temp 98.4°F | Ht <= 58 in | Wt 166.5 lb

## 2018-06-12 DIAGNOSIS — I10 Essential (primary) hypertension: Secondary | ICD-10-CM | POA: Diagnosis not present

## 2018-06-12 DIAGNOSIS — E559 Vitamin D deficiency, unspecified: Secondary | ICD-10-CM

## 2018-06-12 DIAGNOSIS — J45909 Unspecified asthma, uncomplicated: Secondary | ICD-10-CM | POA: Diagnosis not present

## 2018-06-12 DIAGNOSIS — N3289 Other specified disorders of bladder: Secondary | ICD-10-CM | POA: Diagnosis not present

## 2018-06-12 DIAGNOSIS — Z87440 Personal history of urinary (tract) infections: Secondary | ICD-10-CM | POA: Diagnosis not present

## 2018-06-12 DIAGNOSIS — B029 Zoster without complications: Secondary | ICD-10-CM

## 2018-06-12 DIAGNOSIS — E119 Type 2 diabetes mellitus without complications: Secondary | ICD-10-CM

## 2018-06-12 DIAGNOSIS — Z0000 Encounter for general adult medical examination without abnormal findings: Secondary | ICD-10-CM

## 2018-06-12 DIAGNOSIS — M79643 Pain in unspecified hand: Secondary | ICD-10-CM

## 2018-06-12 DIAGNOSIS — D0590 Unspecified type of carcinoma in situ of unspecified breast: Secondary | ICD-10-CM

## 2018-06-12 DIAGNOSIS — Z7189 Other specified counseling: Secondary | ICD-10-CM

## 2018-06-12 MED ORDER — ESTRADIOL 0.1 MG/GM VA CREA: TOPICAL_CREAM | VAGINAL | 12 refills | Status: DC

## 2018-06-12 MED ORDER — FLUTICASONE PROPIONATE HFA 110 MCG/ACT IN AERO: 2.0000 | INHALATION_SPRAY | Freq: Two times a day (BID) | RESPIRATORY_TRACT | 12 refills | Status: DC

## 2018-06-12 MED ORDER — ALBUTEROL SULFATE HFA 108 (90 BASE) MCG/ACT IN AERS: 2.0000 | INHALATION_SPRAY | Freq: Four times a day (QID) | RESPIRATORY_TRACT | 2 refills | Status: DC | PRN

## 2018-06-12 MED ORDER — VITAMIN D 50 MCG (2000 UT) PO TABS: 2000.0000 [IU] | ORAL_TABLET | Freq: Every day | ORAL | Status: DC

## 2018-06-12 MED ORDER — PRAVASTATIN SODIUM 80 MG PO TABS: 80.0000 mg | ORAL_TABLET | Freq: Every day | ORAL | 3 refills | Status: DC

## 2018-06-12 MED ORDER — MELOXICAM 7.5 MG PO TABS: 7.5000 mg | ORAL_TABLET | Freq: Two times a day (BID) | ORAL | 3 refills | Status: DC

## 2018-06-12 MED ORDER — LISINOPRIL 10 MG PO TABS: 10.0000 mg | ORAL_TABLET | Freq: Every day | ORAL | 3 refills | Status: DC

## 2018-06-12 NOTE — Patient Instructions (Signed)
Thanks for your effort.  Recheck in about 6 months.   The only lab you need to have done for your next diabetic visit is an A1c.  We can do this with a fingerstick test at the office visit.  You do not need a lab visit ahead of time for this.  It does not matter if you are fasting when the lab is done.   Change vit D to 2000 IU a day.  Take care.  Glad to see you.
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
## 2018-06-13 DIAGNOSIS — M79643 Pain in unspecified hand: Secondary | ICD-10-CM | POA: Insufficient documentation

## 2018-06-13 DIAGNOSIS — N3289 Other specified disorders of bladder: Secondary | ICD-10-CM | POA: Insufficient documentation

## 2018-06-13 NOTE — Assessment & Plan Note (Signed)
She will follow-up with [facility].  I will defer.  She agrees.  We talked about estrogen use.  Given her decrease in UTI frequency is likely okay to continue topical estrogen.

## 2018-06-13 NOTE — Assessment & Plan Note (Signed)
Urology follow-up pending.

## 2018-06-13 NOTE — Assessment & Plan Note (Signed)
controlled. Using inhalers at baseline.  No wheeze usually, unless concurrently ill.  No thrush with flovent, cautions d/w pt.

## 2018-06-13 NOTE — Assessment & Plan Note (Signed)
Advance directive- husband designated if patient were incapacitated.  She doesn't want her daughter to make decisions for her.

## 2018-06-13 NOTE — Assessment & Plan Note (Signed)
Minimal residual symptoms.  She will check on getting Shingrix later on when available.

## 2018-06-13 NOTE — Assessment & Plan Note (Addendum)
Resolved on recheck.  Her knee pain is better in the meantime with vitamin D repletion.
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
## 2018-06-13 NOTE — Assessment & Plan Note (Signed)
A1c better.  Discussed with patient.  She has been working on diet.  Recheck periodically.  She agrees.  Unremarkable foot exam.

## 2018-06-13 NOTE — Assessment & Plan Note (Signed)
Continue as is.  No change in meds.  She agrees.

## 2018-06-13 NOTE — Assessment & Plan Note (Signed)
Flu 2019 Shingles out of stock.  PNA UTD  Tetanus 2016 Cologuard 2019 Breast cancer screening pending per [facility] clinic.  Advance directive- husband designated if patient were incapacitated.  She doesn't want her daughter to make decisions for her.  Cognitive function addressed- see scanned forms- and if abnormal then additional documentation follows.

## 2018-06-13 NOTE — Assessment & Plan Note (Signed)
Clear dec in UTI frequency with vaginal estrogen.  Discussed continuing.  She agrees.

## 2018-06-19 ENCOUNTER — Telehealth: Payer: Self-pay | Admitting: Family Medicine

## 2018-06-19 NOTE — Telephone Encounter (Signed)
error 

## 2018-06-29 DIAGNOSIS — R9341 Abnormal radiologic findings on diagnostic imaging of renal pelvis, ureter, or bladder: Secondary | ICD-10-CM | POA: Diagnosis not present

## 2018-07-12 DIAGNOSIS — Q644 Malformation of urachus: Secondary | ICD-10-CM | POA: Diagnosis not present

## 2018-07-16 ENCOUNTER — Other Ambulatory Visit: Payer: Self-pay | Admitting: Family Medicine

## 2018-09-25 ENCOUNTER — Encounter: Payer: Self-pay | Admitting: Family Medicine

## 2018-09-27 ENCOUNTER — Encounter: Payer: Self-pay | Admitting: Family Medicine

## 2018-09-27 ENCOUNTER — Other Ambulatory Visit (INDEPENDENT_AMBULATORY_CARE_PROVIDER_SITE_OTHER): Payer: Medicare HMO

## 2018-09-27 ENCOUNTER — Ambulatory Visit (INDEPENDENT_AMBULATORY_CARE_PROVIDER_SITE_OTHER): Payer: Medicare HMO | Admitting: Family Medicine

## 2018-09-27 ENCOUNTER — Telehealth: Payer: Self-pay | Admitting: Family Medicine

## 2018-09-27 DIAGNOSIS — M719 Bursopathy, unspecified: Secondary | ICD-10-CM

## 2018-09-27 DIAGNOSIS — E559 Vitamin D deficiency, unspecified: Secondary | ICD-10-CM | POA: Diagnosis not present

## 2018-09-27 LAB — VITAMIN D 25 HYDROXY (VIT D DEFICIENCY, FRACTURES): VITD: 34.64 ng/mL (ref 30.00–100.00)

## 2018-09-27 NOTE — Progress Notes (Signed)
Virtual visit completed through WebEx or similar program Patient location: home  Provider location: [place] [facility], office   Limitations and rationale for visit method d/w patient.  Patient agreed to proceed.   CC: pain  HPI: For over 2 wks increased achy pain in rt knee and hip  that is continuous; bearing wt makes pain worse. last Vit D level 06/08/18. pt presently taking Vit D 2000 units daily OTC. pt fell 2 wks ago on lt side but the pain in rt knee and hip was already there.   She didn't know if her vit D level is back down.  She is already taking meloxicam.     She had been doing a lot of work on a ladder prior to the onset of sx.   She has pain on the R hip near the greater trochanter area when up and walking.  She doesn't have as much pain with internal rotation.   She thought the knee was a separate issue, ie not radicular. She doesn't have typical sciatica sx.    When her vit D level was low, she clearly had more aches.   Discussed with patient about rechecking vitamin D level.  Meds and allergies reviewed.   ROS: Per HPI unless specifically indicated in ROS section   NAD Speech wnl  A/P: Possible bursitis.  Try icing.  Can try changing from meloxicam to ibuprofen with routine cautions.  Can check vit D today at 2pm.   She agrees.

## 2018-09-27 NOTE — Telephone Encounter (Signed)
Pt scheduled Doxy.me today at 11:30. See appt notes for additional info.FYI to Dr Damita Dunnings.

## 2018-09-27 NOTE — Telephone Encounter (Signed)
Noted. Thanks.

## 2018-09-27 NOTE — Telephone Encounter (Signed)
See mychart message below.  Please triage patient and update me.  Thanks.   =================== ----- Message from Pete Pelt, LPN sent at 4/70/9295 9:06 AM EDT -----      ----- Message from Hodder, [name] to [name], MD sent at 09/25/2018 4:58 PM -----   I had gotten so used to not hurting after the Vit D level was normal and now for about 2 weeks my R knee and hip have given me a fit. Any suggestions?

## 2018-09-30 DIAGNOSIS — M719 Bursopathy, unspecified: Secondary | ICD-10-CM | POA: Insufficient documentation

## 2018-09-30 NOTE — Assessment & Plan Note (Addendum)
Possible bursitis.  Try icing.  Can try changing from meloxicam to ibuprofen with routine cautions.  Can check vit D today at 2pm.   She agrees.   Routine cautions discussed with patient.

## 2018-10-31 ENCOUNTER — Encounter: Payer: Self-pay | Admitting: Family Medicine

## 2018-11-01 ENCOUNTER — Encounter: Payer: Self-pay | Admitting: Internal Medicine

## 2018-11-01 ENCOUNTER — Other Ambulatory Visit: Payer: Medicare HMO

## 2018-11-01 ENCOUNTER — Ambulatory Visit (INDEPENDENT_AMBULATORY_CARE_PROVIDER_SITE_OTHER): Payer: Medicare HMO | Admitting: Internal Medicine

## 2018-11-01 DIAGNOSIS — R8290 Unspecified abnormal findings in urine: Secondary | ICD-10-CM

## 2018-11-01 DIAGNOSIS — N3000 Acute cystitis without hematuria: Secondary | ICD-10-CM | POA: Diagnosis not present

## 2018-11-01 DIAGNOSIS — R300 Dysuria: Secondary | ICD-10-CM

## 2018-11-01 LAB — POC URINALSYSI DIPSTICK (AUTOMATED)
Bilirubin, UA: NEGATIVE
Glucose, UA: NEGATIVE
Ketones, UA: NEGATIVE
Nitrite, UA: POSITIVE
Protein, UA: POSITIVE — AB
Spec Grav, UA: 1.02 (ref 1.010–1.025)
Urobilinogen, UA: 1 E.U./dL
pH, UA: 6 (ref 5.0–8.0)

## 2018-11-01 MED ORDER — SULFAMETHOXAZOLE-TRIMETHOPRIM 400-80 MG PO TABS: 1.0000 | ORAL_TABLET | Freq: Two times a day (BID) | ORAL | 0 refills | Status: DC
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
## 2018-11-01 NOTE — Telephone Encounter (Signed)
Pt has virtual appt with Avie Echevaria NP today at 11:30. Pt will bring urine specimen today at 10 AM.

## 2018-11-01 NOTE — Patient Instructions (Signed)
Urinary Tract Infection, Adult A urinary tract infection (UTI) is an infection of any part of the urinary tract. The urinary tract includes:  The kidneys.  The ureters.  The bladder.  The urethra. These organs make, store, and get rid of pee (urine) in the body. What are the causes? This is caused by germs (bacteria) in your genital area. These germs grow and cause swelling (inflammation) of your urinary tract. What increases the risk? You are more likely to develop this condition if:  You have a small, thin tube (catheter) to drain pee.  You cannot control when you pee or poop (incontinence).  You are female, and: ? You use these methods to prevent pregnancy: ? A medicine that kills sperm (spermicide). ? A device that blocks sperm (diaphragm). ? You have low levels of a female hormone (estrogen). ? You are pregnant.  You have genes that add to your risk.  You are sexually active.  You take antibiotic medicines.  You have trouble peeing because of: ? A prostate that is bigger than normal, if you are female. ? A blockage in the part of your body that drains pee from the bladder (urethra). ? A kidney stone. ? A nerve condition that affects your bladder (neurogenic bladder). ? Not getting enough to drink. ? Not peeing often enough.  You have other conditions, such as: ? Diabetes. ? A weak disease-fighting system (immune system). ? Sickle cell disease. ? Gout. ? Injury of the spine. What are the signs or symptoms? Symptoms of this condition include:  Needing to pee right away (urgently).  Peeing often.  Peeing small amounts often.  Pain or burning when peeing.  Blood in the pee.  Pee that smells bad or not like normal.  Trouble peeing.  Pee that is cloudy.  Fluid coming from the vagina, if you are female.  Pain in the belly or lower back. Other symptoms include:  Throwing up (vomiting).  No urge to eat.  Feeling mixed up (confused).  Being tired  and grouchy (irritable).  A fever.  Watery poop (diarrhea). How is this treated? This condition may be treated with:  Antibiotic medicine.  Other medicines.  Drinking enough water. Follow these instructions at home:  Medicines  Take over-the-counter and prescription medicines only as told by your doctor.  If you were prescribed an antibiotic medicine, take it as told by your doctor. Do not stop taking it even if you start to feel better. General instructions  Make sure you: ? Pee until your bladder is empty. ? Do not hold pee for a long time. ? Empty your bladder after sex. ? Wipe from front to back after pooping if you are a female. Use each tissue one time when you wipe.  Drink enough fluid to keep your pee pale yellow.  Keep all follow-up visits as told by your doctor. This is important. Contact a doctor if:  You do not get better after 1-2 days.  Your symptoms go away and then come back. Get help right away if:  You have very bad back pain.  You have very bad pain in your lower belly.  You have a fever.  You are sick to your stomach (nauseous).  You are throwing up. Summary  A urinary tract infection (UTI) is an infection of any part of the urinary tract.  This condition is caused by germs in your genital area.  There are many risk factors for a UTI. These include having a small, thin   tube to drain pee and not being able to control when you pee or poop.  Treatment includes antibiotic medicines for germs.  Drink enough fluid to keep your pee pale yellow. This information is not intended to replace advice given to you by your health care provider. Make sure you discuss any questions you have with your health care provider. Document Released: 11/09/2007 Document Revised: 11/30/2017 Document Reviewed: 11/30/2017 Elsevier Interactive Patient Education  2019 Elsevier Inc.  

## 2018-11-03 LAB — URINE CULTURE
MICRO NUMBER:: 514636
SPECIMEN QUALITY:: ADEQUATE

## 2018-11-05 ENCOUNTER — Encounter: Payer: Self-pay | Admitting: Family Medicine

## 2018-11-12 ENCOUNTER — Encounter: Payer: Self-pay | Admitting: Family Medicine

## 2018-11-13 ENCOUNTER — Other Ambulatory Visit: Payer: Self-pay | Admitting: Family Medicine

## 2018-11-13 MED ORDER — SULFAMETHOXAZOLE-TRIMETHOPRIM 400-80 MG PO TABS: 1.0000 | ORAL_TABLET | Freq: Two times a day (BID) | ORAL | 0 refills | Status: DC

## 2018-11-15 DIAGNOSIS — Z9011 Acquired absence of right breast and nipple: Secondary | ICD-10-CM | POA: Diagnosis not present

## 2018-11-15 DIAGNOSIS — C50911 Malignant neoplasm of unspecified site of right female breast: Secondary | ICD-10-CM | POA: Diagnosis not present

## 2018-11-15 DIAGNOSIS — Z1231 Encounter for screening mammogram for malignant neoplasm of breast: Secondary | ICD-10-CM | POA: Diagnosis not present

## 2018-11-15 DIAGNOSIS — Z170 Estrogen receptor positive status [ER+]: Secondary | ICD-10-CM | POA: Diagnosis not present

## 2018-11-15 LAB — HM MAMMOGRAPHY

## 2018-11-19 ENCOUNTER — Encounter: Payer: Self-pay | Admitting: Family Medicine

## 2018-12-11 ENCOUNTER — Ambulatory Visit: Payer: Medicare HMO | Admitting: Family Medicine

## 2018-12-18 ENCOUNTER — Ambulatory Visit: Payer: Medicare HMO | Admitting: Family Medicine

## 2018-12-20 DIAGNOSIS — R69 Illness, unspecified: Secondary | ICD-10-CM | POA: Diagnosis not present

## 2019-02-08 ENCOUNTER — Other Ambulatory Visit: Payer: Self-pay

## 2019-02-12 ENCOUNTER — Other Ambulatory Visit: Payer: Self-pay

## 2019-02-12 ENCOUNTER — Ambulatory Visit (INDEPENDENT_AMBULATORY_CARE_PROVIDER_SITE_OTHER): Payer: Medicare HMO | Admitting: Family Medicine

## 2019-02-12 ENCOUNTER — Encounter: Payer: Self-pay | Admitting: Family Medicine

## 2019-02-12 VITALS — BP 112/70 | HR 95 | Temp 97.9°F | Ht <= 58 in | Wt 169.5 lb

## 2019-02-12 DIAGNOSIS — E119 Type 2 diabetes mellitus without complications: Secondary | ICD-10-CM | POA: Diagnosis not present

## 2019-02-12 DIAGNOSIS — Z23 Encounter for immunization: Secondary | ICD-10-CM | POA: Diagnosis not present

## 2019-02-12 LAB — POCT GLYCOSYLATED HEMOGLOBIN (HGB A1C): Hemoglobin A1C: 6.6 % — AB (ref 4.0–5.6)

## 2019-02-12 NOTE — Progress Notes (Signed)
Her adopted daughter's daughter moved out, to be with her biological father.  Her house is calmer in the meantime.    Diabetes:  No meds.   Hypoglycemic episodes: not unless prolonged fasting.   Hyperglycemic episodes:no Feet problems: no Blood Sugars averaging: not checked.   eye exam within last year: yes A1c 6.6 d/w pt.    Flu shot today.   Meds, vitals, and allergies reviewed.   ROS: Per HPI unless specifically indicated in ROS section   GEN: nad, alert and oriented HEENT: ncat NECK: supple w/o LA CV: rrr. PULM: ctab, no inc wob ABD: soft, +bs EXT: no edema SKIN: no acute rash  Diabetic foot exam: Normal inspection No skin breakdown No calluses  Normal DP pulses Normal sensation to light touch and monofilament Nails normal

## 2019-02-12 NOTE — Patient Instructions (Signed)
Don't change your meds for now.  Keep working on diet and exercise.   Recheck labs prior to a yearly visit ~06/2019.  Flu shot today.  Wait 1 month prior to the first shingrix shot.  Take care.  Glad to see you.

## 2019-02-13 NOTE — Assessment & Plan Note (Signed)
A1c 6.6, d/w pt . No change in meds for now.  She will keep working on diet and exercise.   Recheck labs prior to a yearly visit ~06/2019.

## 2019-05-01 ENCOUNTER — Encounter: Payer: Self-pay | Admitting: Family Medicine

## 2019-05-05 NOTE — Telephone Encounter (Signed)
Please call pt to check on her and her husband.  See mychart message.  She had a recent positive covid test (11/23).  Thanks.

## 2019-05-06 NOTE — Telephone Encounter (Signed)
Patient says she still has not really had any symptoms but has quarantined.  Her husband has not received his results yet and it has been a week.  Patient questions if this may have been a false positive.

## 2019-05-07 NOTE — Telephone Encounter (Signed)
I would presume it to be a true positive given the local rates of infection.  Also false negs are likely more common than false positives.  Please have her continue as is and update [place] as needed. Thanks.

## 2019-05-12 ENCOUNTER — Encounter: Payer: Self-pay | Admitting: Family Medicine

## 2019-05-13 DIAGNOSIS — Z961 Presence of intraocular lens: Secondary | ICD-10-CM | POA: Diagnosis not present

## 2019-05-13 LAB — HM DIABETES EYE EXAM

## 2019-06-02 ENCOUNTER — Other Ambulatory Visit: Payer: Self-pay | Admitting: Family Medicine

## 2019-06-02 DIAGNOSIS — E119 Type 2 diabetes mellitus without complications: Secondary | ICD-10-CM

## 2019-06-02 DIAGNOSIS — E559 Vitamin D deficiency, unspecified: Secondary | ICD-10-CM

## 2019-06-04 ENCOUNTER — Encounter: Payer: Self-pay | Admitting: Family Medicine

## 2019-06-12 ENCOUNTER — Other Ambulatory Visit (INDEPENDENT_AMBULATORY_CARE_PROVIDER_SITE_OTHER): Payer: Medicare HMO

## 2019-06-12 ENCOUNTER — Ambulatory Visit (INDEPENDENT_AMBULATORY_CARE_PROVIDER_SITE_OTHER): Payer: Medicare HMO | Admitting: Family Medicine

## 2019-06-12 ENCOUNTER — Encounter: Payer: Self-pay | Admitting: Family Medicine

## 2019-06-12 ENCOUNTER — Other Ambulatory Visit: Payer: Self-pay

## 2019-06-12 VITALS — BP 120/80 | HR 103 | Temp 97.9°F | Ht <= 58 in | Wt 170.0 lb

## 2019-06-12 DIAGNOSIS — E119 Type 2 diabetes mellitus without complications: Secondary | ICD-10-CM | POA: Diagnosis not present

## 2019-06-12 DIAGNOSIS — E559 Vitamin D deficiency, unspecified: Secondary | ICD-10-CM

## 2019-06-12 DIAGNOSIS — M109 Gout, unspecified: Secondary | ICD-10-CM | POA: Diagnosis not present

## 2019-06-12 DIAGNOSIS — J0100 Acute maxillary sinusitis, unspecified: Secondary | ICD-10-CM

## 2019-06-12 LAB — HEMOGLOBIN A1C: Hgb A1c MFr Bld: 6.8 % — ABNORMAL HIGH (ref 4.6–6.5)

## 2019-06-12 LAB — LIPID PANEL
Cholesterol: 202 mg/dL — ABNORMAL HIGH (ref 0–200)
HDL: 53.9 mg/dL (ref >39.00)
NonHDL: 148.35
Total CHOL/HDL Ratio: 4
Triglycerides: 201 mg/dL — ABNORMAL HIGH (ref 0.0–149.0)
VLDL: 40.2 mg/dL — ABNORMAL HIGH (ref 0.0–40.0)

## 2019-06-12 LAB — COMPREHENSIVE METABOLIC PANEL
ALT: 10 U/L (ref 0–35)
AST: 12 U/L (ref 0–37)
Albumin: 4.3 g/dL (ref 3.5–5.2)
Alkaline Phosphatase: 85 U/L (ref 39–117)
BUN: 25 mg/dL — ABNORMAL HIGH (ref 6–23)
CO2: 26 mEq/L (ref 19–32)
Calcium: 10.1 mg/dL (ref 8.4–10.5)
Chloride: 101 mEq/L (ref 96–112)
Creatinine, Ser: 0.99 mg/dL (ref 0.40–1.20)
GFR: 55.07 mL/min — ABNORMAL LOW (ref >60.00)
Glucose, Bld: 141 mg/dL — ABNORMAL HIGH (ref 70–99)
Potassium: 3.9 mEq/L (ref 3.5–5.1)
Sodium: 136 mEq/L (ref 135–145)
Total Bilirubin: 0.5 mg/dL (ref 0.2–1.2)
Total Protein: 8.1 g/dL (ref 6.0–8.3)

## 2019-06-12 LAB — LDL CHOLESTEROL, DIRECT: Direct LDL: 118 mg/dL

## 2019-06-12 LAB — VITAMIN D 25 HYDROXY (VIT D DEFICIENCY, FRACTURES): VITD: 47.58 ng/mL (ref 30.00–100.00)

## 2019-06-12 MED ORDER — AMOXICILLIN-POT CLAVULANATE 875-125 MG PO TABS: 1.0000 | ORAL_TABLET | Freq: Two times a day (BID) | ORAL | 0 refills | Status: DC

## 2019-06-12 MED ORDER — PREDNISONE 20 MG PO TABS: ORAL_TABLET | ORAL | 0 refills | Status: DC
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
## 2019-06-13 ENCOUNTER — Ambulatory Visit: Payer: Medicare HMO | Admitting: Family Medicine

## 2019-06-17 ENCOUNTER — Other Ambulatory Visit: Payer: Self-pay

## 2019-06-17 ENCOUNTER — Ambulatory Visit (INDEPENDENT_AMBULATORY_CARE_PROVIDER_SITE_OTHER): Payer: Medicare HMO | Admitting: Family Medicine

## 2019-06-17 ENCOUNTER — Encounter: Payer: Self-pay | Admitting: Family Medicine

## 2019-06-17 VITALS — BP 118/74 | HR 83 | Temp 97.3°F | Ht <= 58 in | Wt 171.1 lb

## 2019-06-17 DIAGNOSIS — Z0000 Encounter for general adult medical examination without abnormal findings: Secondary | ICD-10-CM

## 2019-06-17 DIAGNOSIS — E7800 Pure hypercholesterolemia, unspecified: Secondary | ICD-10-CM | POA: Diagnosis not present

## 2019-06-17 DIAGNOSIS — E119 Type 2 diabetes mellitus without complications: Secondary | ICD-10-CM

## 2019-06-17 DIAGNOSIS — I10 Essential (primary) hypertension: Secondary | ICD-10-CM

## 2019-06-17 DIAGNOSIS — J019 Acute sinusitis, unspecified: Secondary | ICD-10-CM | POA: Diagnosis not present

## 2019-06-17 DIAGNOSIS — M109 Gout, unspecified: Secondary | ICD-10-CM | POA: Diagnosis not present

## 2019-06-17 DIAGNOSIS — Z7189 Other specified counseling: Secondary | ICD-10-CM

## 2019-06-17 MED ORDER — ESTRADIOL 0.1 MG/GM VA CREA: TOPICAL_CREAM | VAGINAL | 12 refills | Status: DC

## 2019-06-17 MED ORDER — LISINOPRIL 10 MG PO TABS: 10.0000 mg | ORAL_TABLET | Freq: Every day | ORAL | 3 refills | Status: DC

## 2019-06-17 MED ORDER — PREDNISONE 20 MG PO TABS: ORAL_TABLET | ORAL | 0 refills | Status: DC

## 2019-06-17 MED ORDER — MELOXICAM 7.5 MG PO TABS: 7.5000 mg | ORAL_TABLET | Freq: Two times a day (BID) | ORAL | 3 refills | Status: DC

## 2019-06-17 MED ORDER — PRAVASTATIN SODIUM 80 MG PO TABS: 80.0000 mg | ORAL_TABLET | Freq: Every day | ORAL | 3 refills | Status: DC

## 2019-06-17 MED ORDER — FLOVENT HFA 110 MCG/ACT IN AERO: 2.0000 | INHALATION_SPRAY | Freq: Two times a day (BID) | RESPIRATORY_TRACT | 12 refills | Status: DC

## 2019-06-17 MED ORDER — ALBUTEROL SULFATE HFA 108 (90 BASE) MCG/ACT IN AERS: 2.0000 | INHALATION_SPRAY | Freq: Four times a day (QID) | RESPIRATORY_TRACT | 3 refills | Status: DC | PRN

## 2019-06-17 NOTE — Progress Notes (Signed)
This visit occurred during the SARS-CoV-2 public health emergency.  Safety protocols were in place, including screening questions prior to the visit, additional usage of staff PPE, and extensive cleaning of exam room while observing appropriate contact time as indicated for disinfecting solutions.  I have personally reviewed the Medicare Annual Wellness questionnaire and have noted 1. The patient's medical and social history 2. Their use of alcohol, tobacco or illicit drugs 3. Their current medications and supplements 4. The patient's functional ability including ADL's, fall risks, home safety risks and hearing or visual             impairment. 5. Diet and physical activities 6. Evidence for depression or mood disorders  The patients weight, height, BMI have been recorded in the chart and visual acuity is per eye clinic.  I have made referrals, counseling and provided education to the patient based review of the above and I have provided the pt with a written personalized care plan for preventive services.  Provider list updated- see scanned forms.  Routine anticipatory guidance given to patient.  See health maintenance. The possibility exists that previously documented standard health maintenance information may have been brought forward from a previous encounter into this note.  If needed, that same information has been updated to reflect the current situation based on today's encounter.    Flu 2020 Shingles discussed with patient.  See after visit summary. PNA up-to-date Tetanus 2016 colon cancer screening up-to-date with Cologuard 2019. Breast cancer screening 2020 Bone density test 2019 Advance directive-husband designated if patient were incapacitated.  She does not want her daughter making healthcare decisions for her. Cognitive function addressed- see scanned forms- and if abnormal then additional documentation follows.   Hypertension:    Using medication without problems or  lightheadedness: yes Chest pain with exertion:no Edema:no Short of breath:no  Elevated Cholesterol: Using medications without problems: yes Muscle aches: no  Diet compliance: encouraged . Exercise: encouraged   Diabetes:  No meds.  Hypoglycemic episodes: no sx unless prolonged fasting.   Hyperglycemic episodes: no sx Feet problems: no Blood Sugars averaging: not checked.   eye exam within last year: yes, [facility], 05/2019   She had had L sided but not R sided rhinorrhea, congestion and pressure.  Still on augmentin, has 4.5 days of tx left.  She is some better but not resolved.  No fevers.  R wrist pain with gout, some better in the meantime.  She thought it was diet related after xmas.  Gout diet given to patient.  She is on prednisone currently.    Vaginal estrogen d/w pt.  Alcon formulation didn't work.  Otherwise the medication help.  No adverse effect on medication.  PMH and SH reviewed  Meds, vitals, and allergies reviewed.   ROS: Per HPI.  Unless specifically indicated otherwise in HPI, the patient denies:  General: fever. Eyes: acute vision changes ENT: sore throat Cardiovascular: chest pain Respiratory: SOB GI: vomiting GU: dysuria Musculoskeletal: acute back pain Derm: acute rash Neuro: acute motor dysfunction Psych: worsening mood Endocrine: polydipsia Heme: bleeding Allergy: hayfever  GEN: nad, alert and oriented HEENT: ncat NECK: supple w/o LA CV: rrr. PULM: ctab, no inc wob ABD: soft, +bs EXT: no edema SKIN: no acute rash  Health Maintenance  Topic Date Due  . PNA vac Low Risk Adult (2 of 2 - PPSV23) 05/09/2047 (Originally 12/26/2016)  . MAMMOGRAM  11/15/2019  . HEMOGLOBIN A1C  12/10/2019  . FOOT EXAM  02/12/2020  . OPHTHALMOLOGY EXAM  05/12/2020  . Fecal DNA (Cologuard)  06/12/2020  . TETANUS/TDAP  08/04/2024  . INFLUENZA VACCINE  Completed  . DEXA SCAN  Completed  . Hepatitis C Screening  Completed

## 2019-06-17 NOTE — Patient Instructions (Addendum)
Schedule the shingles shots here when you feel better.  Recheck labs prior to a visit in about 3-4 months.  If you have more gout pain, then increase to 40mg  of prednisone and update me.   I would stop the ibuprofen and meloxicam for now.   Take care.  Glad to see you.

## 2019-06-20 NOTE — Assessment & Plan Note (Signed)
Advance directive- husband designated if patient were incapacitated.  She doesn't want her daughter to make decisions for her.

## 2019-06-20 NOTE — Assessment & Plan Note (Signed)
Not at goal but she is going to work more on diet and exercise and we can recheck later on.  See orders.  She agrees.

## 2019-06-20 NOTE — Assessment & Plan Note (Signed)
Flu 2020 Shingles discussed with patient. PNA up-to-date Tetanus 2016 colon cancer screening up-to-date with Cologuard 2019. Breast cancer screening 2020 Bone density test 2019 Advance directive-husband designated if patient were incapacitated.  She does not want her daughter making healthcare decisions for her. Cognitive function addressed- see scanned forms- and if abnormal then additional documentation follows.

## 2019-06-20 NOTE — Assessment & Plan Note (Signed)
Reasonable control.  No change in meds at this point.  Labs discussed with patient.

## 2019-06-20 NOTE — Assessment & Plan Note (Signed)
A1c still at goal.  Discussed diet and exercise we can recheck periodically.  She agrees.

## 2019-06-20 NOTE — Assessment & Plan Note (Signed)
Should improve with completion of antibiotic course.  Update me as needed.  Nontoxic.  She agrees.

## 2019-06-20 NOTE — Assessment & Plan Note (Signed)
Prescription written for prednisone.  She will hold NSAIDs in the meantime.  She can restart prednisone if needed, if not completely resolved by the end of current course.

## 2019-06-24 DIAGNOSIS — R69 Illness, unspecified: Secondary | ICD-10-CM | POA: Diagnosis not present

## 2019-07-01 ENCOUNTER — Ambulatory Visit (INDEPENDENT_AMBULATORY_CARE_PROVIDER_SITE_OTHER): Payer: Medicare HMO | Admitting: Family Medicine

## 2019-07-01 ENCOUNTER — Encounter: Payer: Self-pay | Admitting: Family Medicine

## 2019-07-01 ENCOUNTER — Other Ambulatory Visit: Payer: Self-pay

## 2019-07-01 VITALS — BP 138/82 | HR 100 | Temp 97.6°F | Ht <= 58 in | Wt 173.4 lb

## 2019-07-01 DIAGNOSIS — N3001 Acute cystitis with hematuria: Secondary | ICD-10-CM | POA: Diagnosis not present

## 2019-07-01 DIAGNOSIS — M545 Low back pain, unspecified: Secondary | ICD-10-CM

## 2019-07-01 LAB — POC URINALSYSI DIPSTICK (AUTOMATED)
Bilirubin, UA: NEGATIVE
Glucose, UA: NEGATIVE
Ketones, UA: NEGATIVE
Nitrite, UA: POSITIVE
Protein, UA: POSITIVE — AB
Spec Grav, UA: 1.025 (ref 1.010–1.025)
Urobilinogen, UA: 0.2 E.U./dL
pH, UA: 6 (ref 5.0–8.0)

## 2019-07-01 MED ORDER — CEPHALEXIN 500 MG PO CAPS: 500.0000 mg | ORAL_CAPSULE | Freq: Two times a day (BID) | ORAL | 0 refills | Status: DC

## 2019-07-01 NOTE — Assessment & Plan Note (Signed)
UA and story suspicious for infection. Treat with keflex BID 1 wk course.  Update if not improving with treatment.  UCx sent given recent abx use.

## 2019-07-01 NOTE — Progress Notes (Signed)
This visit was conducted in person.  BP 138/82 (BP Location: Left Arm, Patient Position: Sitting, Cuff Size: Normal)   Pulse 100   Temp 97.6 F (36.4 C) (Temporal)   Ht 4' 9.5" (1.461 m)   Wt 173 lb 6 oz (78.6 kg)   SpO2 98%   BMI 36.87 kg/m    CC: UTI Subjective:    Patient ID: Katie [name], female    DOB: 06-Jul-1945, 74 y.o.   MRN: [id number]  HPI: Katie [name] is a 74 y.o. female presenting on 07/01/2019 for Back Pain (C/o bilateral side, low back pain.  Also, c/o cloudy, strong smelling urine, pain with urination and urinary frequency.  Sxs started 2 days ago. )   2d h/o lower back pain, bilateral side pain, cloudy strong smelling urine with mild dysuria, abd discomfort with urination, urgency, incomplete emptying, and frequency. No fevers/chills, hematuria, n/v or flank pain. H/o recurrent UTIs, last 1+ yr ago.   H/o CVA 2005 on full dose aspirin. She is also on meloxicam regularly.  She had Covid 04/2019 - only presented with HA.  She was recently treated for acute sinusitis with augmentin 10d course. Recent gout flare - took prednisone for this as well.      Relevant past medical, surgical, family and social history reviewed and updated as indicated. Interim medical history since our last visit reviewed. Allergies and medications reviewed and updated. Outpatient Medications Prior to Visit  Medication Sig Dispense Refill  . albuterol (VENTOLIN HFA) 108 (90 Base) MCG/ACT inhaler Inhale 2 puffs into the lungs every 6 (six) hours as needed for wheezing or shortness of breath. Hold for patient to request. 18 g 3  . aspirin 325 MG EC tablet Take 325 mg by mouth daily. Reported on 12/01/2015    . Cholecalciferol (VITAMIN D3) 125 MCG (5000 UT) CAPS Take 1 capsule by mouth daily.    Marland Kitchen estradiol (ESTRACE) 0.1 MG/GM vaginal cream Use as directed, 1 tube/month.  Do not dispense med from [facility] 42.5 g 12  . fluticasone (FLONASE) 50 MCG/ACT nasal spray 2 sprays in each nostril once a  day 16 g 12  . fluticasone (FLOVENT HFA) 110 MCG/ACT inhaler Inhale 2 puffs into the lungs 2 (two) times daily. Rinse after use. 1 Inhaler 12  . lisinopril (ZESTRIL) 10 MG tablet Take 1 tablet (10 mg total) by mouth daily. 90 tablet 3  . meloxicam (MOBIC) 7.5 MG tablet Take 1 tablet (7.5 mg total) by mouth 2 (two) times daily. 180 tablet 3  . omeprazole (PRILOSEC OTC) 20 MG tablet Take 1 tablet (20 mg total) by mouth daily. 90 tablet 3  . pravastatin (PRAVACHOL) 80 MG tablet Take 1 tablet (80 mg total) by mouth daily. 90 tablet 3  . predniSONE (DELTASONE) 20 MG tablet 2 tabs po daily for 5 days, then 1 tab po daily for 5 days 15 tablet 0  . Probiotic Product ([place]) CAPS Take by mouth daily. Reported on 12/01/2015     No facility-administered medications prior to visit.     Per HPI unless specifically indicated in ROS section below Review of Systems Objective:    BP 138/82 (BP Location: Left Arm, Patient Position: Sitting, Cuff Size: Normal)   Pulse 100   Temp 97.6 F (36.4 C) (Temporal)   Ht 4' 9.5" (1.461 m)   Wt 173 lb 6 oz (78.6 kg)   SpO2 98%   BMI 36.87 kg/m   Wt Readings from Last 3 Encounters:  07/01/19 173 lb 6 oz (78.6 kg)  06/17/19 171 lb 2 oz (77.6 kg)  06/12/19 170 lb (77.1 kg)    Physical Exam Vitals and nursing note reviewed.  Constitutional:      Appearance: Normal appearance.  Abdominal:     General: Abdomen is flat. Bowel sounds are normal. There is no distension.     Palpations: Abdomen is soft. There is no mass.     Tenderness: There is no abdominal tenderness. There is no right CVA tenderness, left CVA tenderness, guarding or rebound.     Hernia: No hernia is present.  Neurological:     Mental Status: She is alert.  Psychiatric:        Mood and Affect: Mood normal.        Behavior: Behavior normal.       Results for orders placed or performed in visit on 07/01/19  POCT Urinalysis Dipstick (Automated)  Result Value Ref Range    Color, UA yellow    Clarity, UA cloudy    Glucose, UA Negative Negative   Bilirubin, UA negative    Ketones, UA negative    Spec Grav, UA 1.025 1.010 - 1.025   Blood, UA 1+    pH, UA 6.0 5.0 - 8.0   Protein, UA Positive (A) Negative   Urobilinogen, UA 0.2 0.2 or 1.0 E.U./dL   Nitrite, UA positive    Leukocytes, UA Large (3+) (A) Negative   Assessment & Plan:  This visit occurred during the SARS-CoV-2 public health emergency.  Safety protocols were in place, including screening questions prior to the visit, additional usage of staff PPE, and extensive cleaning of exam room while observing appropriate contact time as indicated for disinfecting solutions.   Problem List Items Addressed This Visit    Acute cystitis with hematuria - Primary    UA and story suspicious for infection. Treat with keflex BID 1 wk course.  Update if not improving with treatment.  UCx sent given recent abx use.      Relevant Orders   Urine Culture    Other Visit Diagnoses    Acute bilateral low back pain, unspecified whether sciatica present       Relevant Orders   POCT Urinalysis Dipstick (Automated) (Completed)       Meds ordered this encounter  Medications  . cephALEXin (KEFLEX) 500 MG capsule    Sig: Take 1 capsule (500 mg total) by mouth 2 (two) times daily.    Dispense:  14 capsule    Refill:  0   Orders Placed This Encounter  Procedures  . Urine Culture  . POCT Urinalysis Dipstick (Automated)    Patient instructions: UA suspicious for UTI - treat with keflex antibiotic sent to pharmacy. Push fluids and rest. May take tylenol for discomfort. Let us know if not improving with treatment.   Follow up plan: Return if symptoms worsen or fail to improve.  Ria Bush, MD

## 2019-07-01 NOTE — Patient Instructions (Signed)
UA suspicious for UTI - treat with keflex antibiotic sent to pharmacy. Push fluids and rest. May take tylenol for discomfort. Let us know if not improving with treatment.   Urinary Tract Infection, Adult  A urinary tract infection (UTI) is an infection of any part of the urinary tract. The urinary tract includes the kidneys, ureters, bladder, and urethra. These organs make, store, and get rid of urine in the body. Your health care provider may use other names to describe the infection. An upper UTI affects the ureters and kidneys (pyelonephritis). A lower UTI affects the bladder (cystitis) and urethra (urethritis). What are the causes? Most urinary tract infections are caused by bacteria in your genital area, around the entrance to your urinary tract (urethra). These bacteria grow and cause inflammation of your urinary tract. What increases the risk? You are more likely to develop this condition if:  You have a urinary catheter that stays in place (indwelling).  You are not able to control when you urinate or have a bowel movement (you have incontinence).  You are female and you: ? Use a spermicide or diaphragm for birth control. ? Have low estrogen levels. ? Are pregnant.  You have certain genes that increase your risk (genetics).  You are sexually active.  You take antibiotic medicines.  You have a condition that causes your flow of urine to slow down, such as: ? An enlarged prostate, if you are female. ? Blockage in your urethra (stricture). ? A kidney stone. ? A nerve condition that affects your bladder control (neurogenic bladder). ? Not getting enough to drink, or not urinating often.  You have certain medical conditions, such as: ? Diabetes. ? A weak disease-fighting system (immunesystem). ? Sickle cell disease. ? Gout. ? Spinal cord injury. What are the signs or symptoms? Symptoms of this condition include:  Needing to urinate right away (urgently).  Frequent  urination or passing small amounts of urine frequently.  Pain or burning with urination.  Blood in the urine.  Urine that smells bad or unusual.  Trouble urinating.  Cloudy urine.  Vaginal discharge, if you are female.  Pain in the abdomen or the lower back. You may also have:  Vomiting or a decreased appetite.  Confusion.  Irritability or tiredness.  A fever.  Diarrhea. The first symptom in older adults may be confusion. In some cases, they may not have any symptoms until the infection has worsened. How is this diagnosed? This condition is diagnosed based on your medical history and a physical exam. You may also have other tests, including:  Urine tests.  Blood tests.  Tests for sexually transmitted infections (STIs). If you have had more than one UTI, a cystoscopy or imaging studies may be done to determine the cause of the infections. How is this treated? Treatment for this condition includes:  Antibiotic medicine.  Over-the-counter medicines to treat discomfort.  Drinking enough water to stay hydrated. If you have frequent infections or have other conditions such as a kidney stone, you may need to see a health care provider who specializes in the urinary tract (urologist). In rare cases, urinary tract infections can cause sepsis. Sepsis is a life-threatening condition that occurs when the body responds to an infection. Sepsis is treated in the hospital with IV antibiotics, fluids, and other medicines. Follow these instructions at home:  Medicines  Take over-the-counter and prescription medicines only as told by your health care provider.  If you were prescribed an antibiotic medicine, take it as  told by your health care provider. Do not stop using the antibiotic even if you start to feel better. General instructions  Make sure you: ? Empty your bladder often and completely. Do not hold urine for long periods of time. ? Empty your bladder after sex. ? Wipe  from front to back after a bowel movement if you are female. Use each tissue one time when you wipe.  Drink enough fluid to keep your urine pale yellow.  Keep all follow-up visits as told by your health care provider. This is important. Contact a health care provider if:  Your symptoms do not get better after 1-2 days.  Your symptoms go away and then return. Get help right away if you have:  Severe pain in your back or your lower abdomen.  A fever.  Nausea or vomiting. Summary  A urinary tract infection (UTI) is an infection of any part of the urinary tract, which includes the kidneys, ureters, bladder, and urethra.  Most urinary tract infections are caused by bacteria in your genital area, around the entrance to your urinary tract (urethra).  Treatment for this condition often includes antibiotic medicines.  If you were prescribed an antibiotic medicine, take it as told by your health care provider. Do not stop using the antibiotic even if you start to feel better.  Keep all follow-up visits as told by your health care provider. This is important. This information is not intended to replace advice given to you by your health care provider. Make sure you discuss any questions you have with your health care provider. Document Revised: 05/10/2018 Document Reviewed: 11/30/2017 Elsevier Patient Education  2020 [employer].

## 2019-07-03 LAB — URINE CULTURE
MICRO NUMBER:: 10076784
SPECIMEN QUALITY:: ADEQUATE

## 2019-07-29 DIAGNOSIS — R69 Illness, unspecified: Secondary | ICD-10-CM | POA: Diagnosis not present

## 2019-10-02 ENCOUNTER — Other Ambulatory Visit (INDEPENDENT_AMBULATORY_CARE_PROVIDER_SITE_OTHER): Payer: Medicare HMO

## 2019-10-02 ENCOUNTER — Other Ambulatory Visit: Payer: Self-pay

## 2019-10-02 DIAGNOSIS — E119 Type 2 diabetes mellitus without complications: Secondary | ICD-10-CM

## 2019-10-02 DIAGNOSIS — M109 Gout, unspecified: Secondary | ICD-10-CM | POA: Diagnosis not present

## 2019-10-02 LAB — LIPID PANEL
Cholesterol: 184 mg/dL (ref 0–200)
HDL: 45.3 mg/dL (ref >39.00)
NonHDL: 138.24
Total CHOL/HDL Ratio: 4
Triglycerides: 261 mg/dL — ABNORMAL HIGH (ref 0.0–149.0)
VLDL: 52.2 mg/dL — ABNORMAL HIGH (ref 0.0–40.0)

## 2019-10-02 LAB — LDL CHOLESTEROL, DIRECT: Direct LDL: 92 mg/dL

## 2019-10-02 LAB — URIC ACID: Uric Acid, Serum: 9.2 mg/dL — ABNORMAL HIGH (ref 2.4–7.0)

## 2019-10-02 LAB — HEMOGLOBIN A1C: Hgb A1c MFr Bld: 7 % — ABNORMAL HIGH (ref 4.6–6.5)

## 2019-10-07 ENCOUNTER — Ambulatory Visit: Payer: Medicare HMO | Admitting: Family Medicine

## 2019-10-08 ENCOUNTER — Ambulatory Visit: Payer: Medicare HMO | Admitting: Family Medicine

## 2019-10-10 ENCOUNTER — Ambulatory Visit: Payer: Medicare HMO | Admitting: Family Medicine

## 2019-10-14 ENCOUNTER — Other Ambulatory Visit: Payer: Self-pay

## 2019-10-14 ENCOUNTER — Encounter: Payer: Self-pay | Admitting: Family Medicine

## 2019-10-14 ENCOUNTER — Ambulatory Visit (INDEPENDENT_AMBULATORY_CARE_PROVIDER_SITE_OTHER): Payer: Medicare HMO | Admitting: Family Medicine

## 2019-10-14 VITALS — BP 122/70 | HR 102 | Temp 97.2°F | Ht <= 58 in | Wt 171.3 lb

## 2019-10-14 DIAGNOSIS — E7800 Pure hypercholesterolemia, unspecified: Secondary | ICD-10-CM | POA: Diagnosis not present

## 2019-10-14 DIAGNOSIS — M109 Gout, unspecified: Secondary | ICD-10-CM

## 2019-10-14 DIAGNOSIS — E119 Type 2 diabetes mellitus without complications: Secondary | ICD-10-CM

## 2019-10-14 NOTE — Patient Instructions (Signed)
Recheck in about 4 months with A1c at the visit.  Take care.  Glad to see you.

## 2019-10-14 NOTE — Progress Notes (Signed)
This visit occurred during the SARS-CoV-2 public health emergency.  Safety protocols were in place, including screening questions prior to the visit, additional usage of staff PPE, and extensive cleaning of exam room while observing appropriate contact time as indicated for disinfecting solutions.  Diabetes:  No meds.   Hypoglycemic episodes: no sx Hyperglycemic episodes: no sx Feet problems: no tingling.   Blood Sugars averaging: not checked.   eye exam within last year: yes She is working on diet and exercise.   A1c similar to prev.    Elevated Cholesterol: Using medications without problems: yes Muscle aches: no Diet compliance: yes Exercise: yes Still on pravastatin.  Gout.  Elevated uric acid.  She is not having frequently flares.  Labs discussed with patient.  Wrist pain better with voltaren gel but some pain with repeated movements.  Reasonable to continue Voltaren gel as needed.  She had a Covid vaccine.  Dates entered in EMR.  PMH and SH reviewed  Meds, vitals, and allergies reviewed.   ROS: Per HPI unless specifically indicated in ROS section   GEN: nad, alert and oriented HEENT: ncat NECK: supple w/o LA CV: rrr. PULM: ctab, no inc wob ABD: soft, +bs EXT: no edema SKIN: no acute rash

## 2019-10-22 NOTE — Assessment & Plan Note (Signed)
Discussed options.  No change in meds at this point.  Continue work on diet and exercise.  She agrees

## 2019-10-22 NOTE — Assessment & Plan Note (Signed)
Discussed options.  No change in meds at this point.  Continue work on diet and exercise.  She agrees.  Recheck periodically.

## 2019-10-22 NOTE — Assessment & Plan Note (Signed)
Continue work on diet and exercise.  No change in meds since she is not having frequent flares.  She agrees.

## 2019-10-31 ENCOUNTER — Ambulatory Visit (INDEPENDENT_AMBULATORY_CARE_PROVIDER_SITE_OTHER): Payer: Medicare HMO | Admitting: Family Medicine

## 2019-10-31 ENCOUNTER — Encounter: Payer: Self-pay | Admitting: Family Medicine

## 2019-10-31 ENCOUNTER — Other Ambulatory Visit: Payer: Self-pay

## 2019-10-31 VITALS — BP 112/68 | HR 79 | Temp 97.6°F | Ht <= 58 in | Wt 169.8 lb

## 2019-10-31 DIAGNOSIS — R300 Dysuria: Secondary | ICD-10-CM | POA: Diagnosis not present

## 2019-10-31 DIAGNOSIS — R3915 Urgency of urination: Secondary | ICD-10-CM

## 2019-10-31 LAB — POCT URINALYSIS DIPSTICK
Bilirubin, UA: NEGATIVE
Glucose, UA: NEGATIVE
Ketones, UA: NEGATIVE
Protein, UA: POSITIVE — AB
Spec Grav, UA: 1.02 (ref 1.010–1.025)
Urobilinogen, UA: 0.2 E.U./dL
pH, UA: 6 (ref 5.0–8.0)

## 2019-10-31 MED ORDER — SULFAMETHOXAZOLE-TRIMETHOPRIM 800-160 MG PO TABS: 1.0000 | ORAL_TABLET | Freq: Two times a day (BID) | ORAL | 0 refills | Status: DC

## 2019-10-31 NOTE — Patient Instructions (Signed)
Drink plenty of water and start the antibiotics today.  We'll contact you with your lab report.  Take care.   

## 2019-10-31 NOTE — Progress Notes (Signed)
This visit occurred during the SARS-CoV-2 public health emergency.  Safety protocols were in place, including screening questions prior to the visit, additional usage of staff PPE, and extensive cleaning of exam room while observing appropriate contact time as indicated for disinfecting solutions.  Dysuria: yes, going on for about 3 days.   duration of symptoms: 3 day.   abdominal pain: no fevers:no back pain:no Vomiting:no U/a d/w pt.  ucx pending.    Meds, vitals, and allergies reviewed.  Per HPI unless specifically indicated in ROS section   GEN: nad, alert and oriented HEENT: ncat CV: mildly tachy but regular o/w.  Recheck pulse 79 PULM: ctab, no inc wob ABD: soft, +bs, suprapubic area not tender EXT: no edema SKIN: no acute rash BACK: no CVA pain

## 2019-11-02 LAB — URINE CULTURE
MICRO NUMBER:: 10527515
SPECIMEN QUALITY:: ADEQUATE

## 2019-11-04 DIAGNOSIS — R300 Dysuria: Secondary | ICD-10-CM | POA: Insufficient documentation

## 2019-11-04 NOTE — Assessment & Plan Note (Signed)
Presumed UTI.  Nontoxic.  Okay for outpatient follow-up.  Routine cautions given to patient.  Start Septra.  See notes on urine culture.  She may not require a full 7 days of treatment with Septra.  Discussed.

## 2019-11-05 ENCOUNTER — Encounter: Payer: Self-pay | Admitting: Family Medicine

## 2019-11-21 DIAGNOSIS — N905 Atrophy of vulva: Secondary | ICD-10-CM | POA: Diagnosis not present

## 2019-11-21 DIAGNOSIS — B3789 Other sites of candidiasis: Secondary | ICD-10-CM | POA: Diagnosis not present

## 2019-11-21 DIAGNOSIS — Z6837 Body mass index (BMI) 37.0-37.9, adult: Secondary | ICD-10-CM | POA: Diagnosis not present

## 2019-11-21 DIAGNOSIS — C50911 Malignant neoplasm of unspecified site of right female breast: Secondary | ICD-10-CM | POA: Diagnosis not present

## 2019-11-21 DIAGNOSIS — E669 Obesity, unspecified: Secondary | ICD-10-CM | POA: Diagnosis not present

## 2019-11-21 DIAGNOSIS — Z1231 Encounter for screening mammogram for malignant neoplasm of breast: Secondary | ICD-10-CM | POA: Diagnosis not present

## 2019-11-21 DIAGNOSIS — Z9011 Acquired absence of right breast and nipple: Secondary | ICD-10-CM | POA: Diagnosis not present

## 2019-11-21 DIAGNOSIS — R21 Rash and other nonspecific skin eruption: Secondary | ICD-10-CM | POA: Diagnosis not present

## 2019-11-21 DIAGNOSIS — Z170 Estrogen receptor positive status [ER+]: Secondary | ICD-10-CM | POA: Diagnosis not present

## 2019-11-21 LAB — HM MAMMOGRAPHY

## 2019-11-22 ENCOUNTER — Encounter: Payer: Self-pay | Admitting: Family Medicine

## 2019-11-22 ENCOUNTER — Telehealth: Payer: Self-pay | Admitting: Family Medicine

## 2019-11-22 NOTE — Telephone Encounter (Signed)
Abstracted

## 2019-11-22 NOTE — Telephone Encounter (Signed)
Please enter mammogram in EMR.  Thanks.  =================== EXAM:  MAMMO SCREEN BREAST WITH TOMOSYNTHESIS LEFT 11/21/2019 9:40 AM    INDICATION:  Screening, history of right mastectomy.    COMPARISON:   Compared to: 11/15/2018 Mammo screening breast tomosynthesis left,   11/06/2017 Mammo screening breast tomosynthesis left, 11/03/2016 Mammo   screening digital left, and 11/03/2016 Mammo diagnostic digital left     TECHNIQUE:  Tomosynthesis images were obtained as part of this exam.    FINDINGS:  The left breast has scattered areas of fibroglandular density.    There are no suspicious masses, calcifications, or other findings in the   left breast.    Patient is status post right sided mastectomy.

## 2020-01-23 ENCOUNTER — Telehealth (INDEPENDENT_AMBULATORY_CARE_PROVIDER_SITE_OTHER): Payer: Medicare HMO | Admitting: Family Medicine

## 2020-01-23 ENCOUNTER — Encounter: Payer: Self-pay | Admitting: Family Medicine

## 2020-01-23 ENCOUNTER — Other Ambulatory Visit: Payer: Self-pay

## 2020-01-23 DIAGNOSIS — K529 Noninfective gastroenteritis and colitis, unspecified: Secondary | ICD-10-CM

## 2020-01-23 NOTE — Progress Notes (Signed)
Virtual visit completed through WebEx or similar program Patient location: home  Provider location: [place] at [facility], office  Participants: Patient and me (unless stated otherwise below)  Pandemic considerations d/w pt.   Limitations and rationale for visit method d/w patient.  Patient agreed to proceed.   CC: GI sx.    HPI:  Sx started end of 7/21, about 3 weeks ago.  She first noted L sided enlarged lymph node in the neck, then that resolved.  Then had scratchy throat but that resolved.  Then vomited started 2 days later.  Then had diarrhea.  Was in bed for about 5 days.  Didn't have much appetite but was taking sips of fluid.  Last elevated temp was about 2 weeks ago along with chills.  Chills resolved.   Doesn't feel as bad as prev but not well at this point.  Had covid test 01/03/20.  She is down about 5 lbs from baseline.  Still with dec in appetite.  No one else was sick.  She has been avoiding dairy at baseline.  No abd pain.  Her stools are getting back to normal per patient report. No BRBPR now but had some with frequent diarrhea prev.  She didn't have loss of taste or smell.   We talked about getting GI path panel done if sx get worse.  She is gradually getting better over the last few days.  She was able to eat a BLT last night w/o abd discomfort and that was an improvement.    Meds and allergies reviewed.   ROS: Per HPI unless specifically indicated in ROS section   NAD Speech wnl  A/P:  She thought this was a prolonged viral issue.  We talked about getting GI path panel done if sx get worse.  She is gradually getting better over the last few days.  She was able to eat a BLT last night w/o abd discomfort and that was an improvement.    Defer further testing at this point.  She agrees.

## 2020-01-26 NOTE — Assessment & Plan Note (Signed)
  She thought this was a prolonged viral issue.  We talked about getting GI path panel done if sx get worse.  She is gradually getting better over the last few days.  She was able to eat a BLT last night w/o abd discomfort and that was an improvement.    Defer further testing at this point.  She agrees.

## 2020-02-14 ENCOUNTER — Encounter: Payer: Self-pay | Admitting: Family Medicine

## 2020-02-14 ENCOUNTER — Other Ambulatory Visit: Payer: Self-pay

## 2020-02-14 ENCOUNTER — Ambulatory Visit (INDEPENDENT_AMBULATORY_CARE_PROVIDER_SITE_OTHER): Payer: Medicare HMO | Admitting: Family Medicine

## 2020-02-14 VITALS — BP 122/70 | HR 93 | Temp 96.7°F | Ht <= 58 in | Wt 166.4 lb

## 2020-02-14 DIAGNOSIS — Z23 Encounter for immunization: Secondary | ICD-10-CM

## 2020-02-14 DIAGNOSIS — E119 Type 2 diabetes mellitus without complications: Secondary | ICD-10-CM

## 2020-02-14 LAB — POCT GLYCOSYLATED HEMOGLOBIN (HGB A1C): Hemoglobin A1C: 6.2 % — AB (ref 4.0–5.6)

## 2020-02-14 NOTE — Patient Instructions (Signed)
Recheck at a yearly visit/physical in about 4-5 months.   We can do your labs ahead of time.  Update me as needed.  Take care.  Glad to see you. Thanks for your effort.

## 2020-02-14 NOTE — Progress Notes (Signed)
This visit occurred during the SARS-CoV-2 public health emergency.  Safety protocols were in place, including screening questions prior to the visit, additional usage of staff PPE, and extensive cleaning of exam room while observing appropriate contact time as indicated for disinfecting solutions.  Diabetes:  No meds.   Hypoglycemic episodes: cautions d/w pt.  Only if prolonged fasting.  Rare sx.   Hyperglycemic episodes: no Feet problems: no Blood Sugars averaging: not checked.   eye exam within last year: A1c 6.2.  A1c down from 7.  She has been working on diet.  She is walking for exercise.    Flu shot done at office visit.  Meds, vitals, and allergies reviewed.  ROS: Per HPI unless specifically indicated in ROS section   GEN: nad, alert and oriented HEENT: ncat NECK: supple w/o LA CV: rrr. PULM: ctab, no inc wob ABD: soft, +bs EXT: no edema SKIN: no acute rash  Diabetic foot exam: Normal inspection No skin breakdown No calluses  Normal DP pulses Normal sensation to light touch and monofilament Nails normal

## 2020-02-16 NOTE — Assessment & Plan Note (Signed)
A1c clearly better.  She has been working on diet and exercise Recheck at a yearly visit/physical in about 4-5 months.   We can do labs ahead of time.  Update me as needed.  She agrees.  No change in meds.

## 2020-05-13 DIAGNOSIS — E119 Type 2 diabetes mellitus without complications: Secondary | ICD-10-CM | POA: Diagnosis not present

## 2020-05-14 LAB — HM DIABETES EYE EXAM

## 2020-05-15 ENCOUNTER — Encounter: Payer: Self-pay | Admitting: Family Medicine

## 2020-06-03 ENCOUNTER — Other Ambulatory Visit: Payer: Self-pay | Admitting: Family Medicine

## 2020-06-03 DIAGNOSIS — E7800 Pure hypercholesterolemia, unspecified: Secondary | ICD-10-CM

## 2020-06-03 DIAGNOSIS — E559 Vitamin D deficiency, unspecified: Secondary | ICD-10-CM

## 2020-06-03 DIAGNOSIS — M109 Gout, unspecified: Secondary | ICD-10-CM

## 2020-06-03 DIAGNOSIS — E119 Type 2 diabetes mellitus without complications: Secondary | ICD-10-CM

## 2020-06-11 ENCOUNTER — Other Ambulatory Visit: Payer: Self-pay

## 2020-06-11 ENCOUNTER — Other Ambulatory Visit (INDEPENDENT_AMBULATORY_CARE_PROVIDER_SITE_OTHER): Payer: Medicare HMO

## 2020-06-11 DIAGNOSIS — E559 Vitamin D deficiency, unspecified: Secondary | ICD-10-CM

## 2020-06-11 DIAGNOSIS — E7800 Pure hypercholesterolemia, unspecified: Secondary | ICD-10-CM | POA: Diagnosis not present

## 2020-06-11 DIAGNOSIS — M109 Gout, unspecified: Secondary | ICD-10-CM

## 2020-06-11 DIAGNOSIS — E119 Type 2 diabetes mellitus without complications: Secondary | ICD-10-CM

## 2020-06-11 LAB — HEMOGLOBIN A1C: Hgb A1c MFr Bld: 6.6 % — ABNORMAL HIGH (ref 4.6–6.5)

## 2020-06-11 LAB — COMPREHENSIVE METABOLIC PANEL
ALT: 11 U/L (ref 0–35)
AST: 15 U/L (ref 0–37)
Albumin: 4.5 g/dL (ref 3.5–5.2)
Alkaline Phosphatase: 69 U/L (ref 39–117)
BUN: 29 mg/dL — ABNORMAL HIGH (ref 6–23)
CO2: 26 mEq/L (ref 19–32)
Calcium: 9.9 mg/dL (ref 8.4–10.5)
Chloride: 102 mEq/L (ref 96–112)
Creatinine, Ser: 1.08 mg/dL (ref 0.40–1.20)
GFR: 50.98 mL/min — ABNORMAL LOW (ref >60.00)
Glucose, Bld: 118 mg/dL — ABNORMAL HIGH (ref 70–99)
Potassium: 4.7 mEq/L (ref 3.5–5.1)
Sodium: 137 mEq/L (ref 135–145)
Total Bilirubin: 0.4 mg/dL (ref 0.2–1.2)
Total Protein: 7.4 g/dL (ref 6.0–8.3)

## 2020-06-11 LAB — LIPID PANEL
Cholesterol: 230 mg/dL — ABNORMAL HIGH (ref 0–200)
HDL: 52.8 mg/dL (ref >39.00)
Total CHOL/HDL Ratio: 4
Triglycerides: 421 mg/dL — ABNORMAL HIGH (ref 0.0–149.0)

## 2020-06-11 LAB — URIC ACID: Uric Acid, Serum: 9.3 mg/dL — ABNORMAL HIGH (ref 2.4–7.0)

## 2020-06-11 LAB — VITAMIN D 25 HYDROXY (VIT D DEFICIENCY, FRACTURES): VITD: 52.41 ng/mL (ref 30.00–100.00)

## 2020-06-11 LAB — LDL CHOLESTEROL, DIRECT: Direct LDL: 102 mg/dL

## 2020-06-18 ENCOUNTER — Encounter: Payer: Self-pay | Admitting: Family Medicine

## 2020-06-18 ENCOUNTER — Other Ambulatory Visit: Payer: Self-pay

## 2020-06-18 ENCOUNTER — Ambulatory Visit (INDEPENDENT_AMBULATORY_CARE_PROVIDER_SITE_OTHER): Payer: Medicare HMO | Admitting: Family Medicine

## 2020-06-18 VITALS — BP 130/68 | HR 101 | Temp 95.5°F | Ht <= 58 in | Wt 166.6 lb

## 2020-06-18 DIAGNOSIS — J45909 Unspecified asthma, uncomplicated: Secondary | ICD-10-CM

## 2020-06-18 DIAGNOSIS — M549 Dorsalgia, unspecified: Secondary | ICD-10-CM

## 2020-06-18 DIAGNOSIS — E7800 Pure hypercholesterolemia, unspecified: Secondary | ICD-10-CM

## 2020-06-18 DIAGNOSIS — I10 Essential (primary) hypertension: Secondary | ICD-10-CM | POA: Diagnosis not present

## 2020-06-18 DIAGNOSIS — Z0000 Encounter for general adult medical examination without abnormal findings: Secondary | ICD-10-CM | POA: Diagnosis not present

## 2020-06-18 DIAGNOSIS — Z1211 Encounter for screening for malignant neoplasm of colon: Secondary | ICD-10-CM

## 2020-06-18 DIAGNOSIS — E119 Type 2 diabetes mellitus without complications: Secondary | ICD-10-CM

## 2020-06-18 DIAGNOSIS — Z7189 Other specified counseling: Secondary | ICD-10-CM

## 2020-06-18 MED ORDER — MELOXICAM 7.5 MG PO TABS: 7.5000 mg | ORAL_TABLET | Freq: Two times a day (BID) | ORAL | 3 refills | Status: DC

## 2020-06-18 MED ORDER — FLOVENT HFA 110 MCG/ACT IN AERO: 2.0000 | INHALATION_SPRAY | Freq: Two times a day (BID) | RESPIRATORY_TRACT | 12 refills | Status: DC

## 2020-06-18 MED ORDER — PRAVASTATIN SODIUM 80 MG PO TABS: 80.0000 mg | ORAL_TABLET | Freq: Every day | ORAL | 3 refills | Status: DC

## 2020-06-18 MED ORDER — ESTRADIOL 0.1 MG/GM VA CREA
TOPICAL_CREAM | VAGINAL | 12 refills | Status: DC
Start: 2020-06-18 — End: 2021-01-08

## 2020-06-18 MED ORDER — ALBUTEROL SULFATE HFA 108 (90 BASE) MCG/ACT IN AERS: 2.0000 | INHALATION_SPRAY | Freq: Four times a day (QID) | RESPIRATORY_TRACT | 3 refills | Status: DC | PRN

## 2020-06-18 MED ORDER — NYSTATIN 100000 UNIT/GM EX CREA: 1.0000 "application " | TOPICAL_CREAM | Freq: Two times a day (BID) | CUTANEOUS | 12 refills | Status: DC

## 2020-06-18 MED ORDER — LISINOPRIL 10 MG PO TABS: 10.0000 mg | ORAL_TABLET | Freq: Every day | ORAL | 3 refills | Status: DC

## 2020-06-18 NOTE — Patient Instructions (Signed)
Plan on recheck in about 4 months with A1c at the visit.  You don't have to fast.  Update me as needed.  Take care.  Glad to see you.

## 2020-06-18 NOTE — Progress Notes (Signed)
This visit occurred during the SARS-CoV-2 public health emergency.  Safety protocols were in place, including screening questions prior to the visit, additional usage of staff PPE, and extensive cleaning of exam room while observing appropriate contact time as indicated for disinfecting solutions.  I have personally reviewed the Medicare Annual Wellness questionnaire and have noted 1. The patient's medical and social history 2. Their use of alcohol, tobacco or illicit drugs 3. Their current medications and supplements 4. The patient's functional ability including ADL's, fall risks, home safety risks and hearing or visual             impairment. 5. Diet and physical activities 6. Evidence for depression or mood disorders  The patients weight, height, BMI have been recorded in the chart and visual acuity is per eye clinic.  I have made referrals, counseling and provided education to the patient based review of the above and I have provided the pt with a written personalized care plan for preventive services.  Provider list updated- see scanned forms.  Routine anticipatory guidance given to patient.  See health maintenance. The possibility exists that previously documented standard health maintenance information may have been brought forward from a previous encounter into this note.  If needed, that same information has been updated to reflect the current situation based on today's encounter.    Flu 2021 Shingles discussed with patient. PNA up-to-date covid vaccine update.   Tetanus 2016  Colon cancer screening with Cologuard 2019.  She declined colonoscopy at this point.  She wanted to repeat cologuard at this point.  Ordered.   Breast cancer screening 2021 Bone density test 2019 Advance directive-husband designated if patient were incapacitated.  She does not want her daughter making healthcare decisions for her. Cognitive function addressed- see scanned forms- and if abnormal then additional  documentation follows.   She'll update me if her pulse is elevated out of clinic.    Elevated Cholesterol: Using medications without problems: yes Muscle aches: not from statin, more likely from back and knee OA Diet compliance: encouraged.   Exercise:encouraged  History of asthma.  Rare wheeze.  Very rare SABA use.  Still using flovent.    Joint pain.  Meloxicam helps with back pain but she isn't pain free.  Cr wnl.  She still has R knee pain.  She wanted to defer tx on R knee.    Hypertension:    Using medication without problems or lightheadedness: yes Chest pain with exertion:no Edema:no Short of breath:no  Labs d/w pt.    DM.  A1c up but not needing meds at this point.  Diet and exercise d/w pt. labs discussed with patient.  No dysphagia, routine cautions discussed with patient.  PMH and SH reviewed  Meds, vitals, and allergies reviewed.   ROS: Per HPI.  Unless specifically indicated otherwise in HPI, the patient denies:  General: fever. Eyes: acute vision changes ENT: sore throat Cardiovascular: chest pain Respiratory: SOB GI: vomiting GU: dysuria Musculoskeletal: acute back pain Derm: acute rash Neuro: acute motor dysfunction Psych: worsening mood Endocrine: polydipsia Heme: bleeding Allergy: hayfever  GEN: nad, alert and oriented HEENT: ncat NECK: supple w/o LA CV: rrr. PULM: ctab, no inc wob ABD: soft, +bs EXT: no edema SKIN: no acute rash  Diabetic foot exam: Normal inspection No skin breakdown No calluses  Normal DP pulses Normal sensation to light touch and monofilament Nails normal

## 2020-06-22 NOTE — Assessment & Plan Note (Signed)
Flu 2021 Shingles discussed with patient. PNA up-to-date covid vaccine update.   Tetanus 2016  Colon cancer screening with Cologuard 2019.  She declined colonoscopy at this point.  She wanted to repeat cologuard at this point.  Ordered.   Breast cancer screening 2021 Bone density test 2019 Advance directive-husband designated if patient were incapacitated.  She does not want her daughter making healthcare decisions for her. Cognitive function addressed- see scanned forms- and if abnormal then additional documentation follows.

## 2020-06-22 NOTE — Assessment & Plan Note (Signed)
Continue lisinopril.  She agrees.  Labs discussed with patient.  Blood pressure controlled.

## 2020-06-22 NOTE — Assessment & Plan Note (Signed)
Labs discussed with patient.  Continue pravastatin.  Discussed with patient about diet and exercise.

## 2020-06-22 NOTE — Assessment & Plan Note (Signed)
Advance directive-husband designated if patient were incapacitated.  She does not want her daughter making healthcare decisions for her.

## 2020-06-22 NOTE — Assessment & Plan Note (Signed)
Meloxicam helps with back pain but she isn't pain free.  Cr wnl.  She still has R knee pain.  She wanted to defer tx on R knee.   Continue meloxicam.

## 2020-06-22 NOTE — Assessment & Plan Note (Signed)
Rare use of albuterol.  Continue Flovent at baseline.  Doing well.  She will update me as needed.  Lungs are clear.

## 2020-06-22 NOTE — Assessment & Plan Note (Signed)
A1c up but not needing meds at this point.  Diet and exercise d/w pt. recheck periodically.  She agrees.

## 2020-06-29 ENCOUNTER — Other Ambulatory Visit: Payer: Self-pay | Admitting: Family Medicine

## 2020-07-07 DIAGNOSIS — Z1211 Encounter for screening for malignant neoplasm of colon: Secondary | ICD-10-CM | POA: Diagnosis not present

## 2020-07-07 LAB — COLOGUARD: Cologuard: NEGATIVE

## 2020-07-15 LAB — COLOGUARD: COLOGUARD: NEGATIVE

## 2020-09-10 DIAGNOSIS — H02403 Unspecified ptosis of bilateral eyelids: Secondary | ICD-10-CM | POA: Diagnosis not present

## 2020-09-19 IMAGING — CR DG CHEST 2V
1 series · 2 of 2 positions shown · non-contrast
Comparison: 12/01/2014

CLINICAL DATA: Medial chest pain radiating into the jaw

EXAM:
CHEST - 2 VIEW

[Series 1: dg chest 2 view · 0.14mm/px · 2 of 2 slices shown]
[im 1/2]
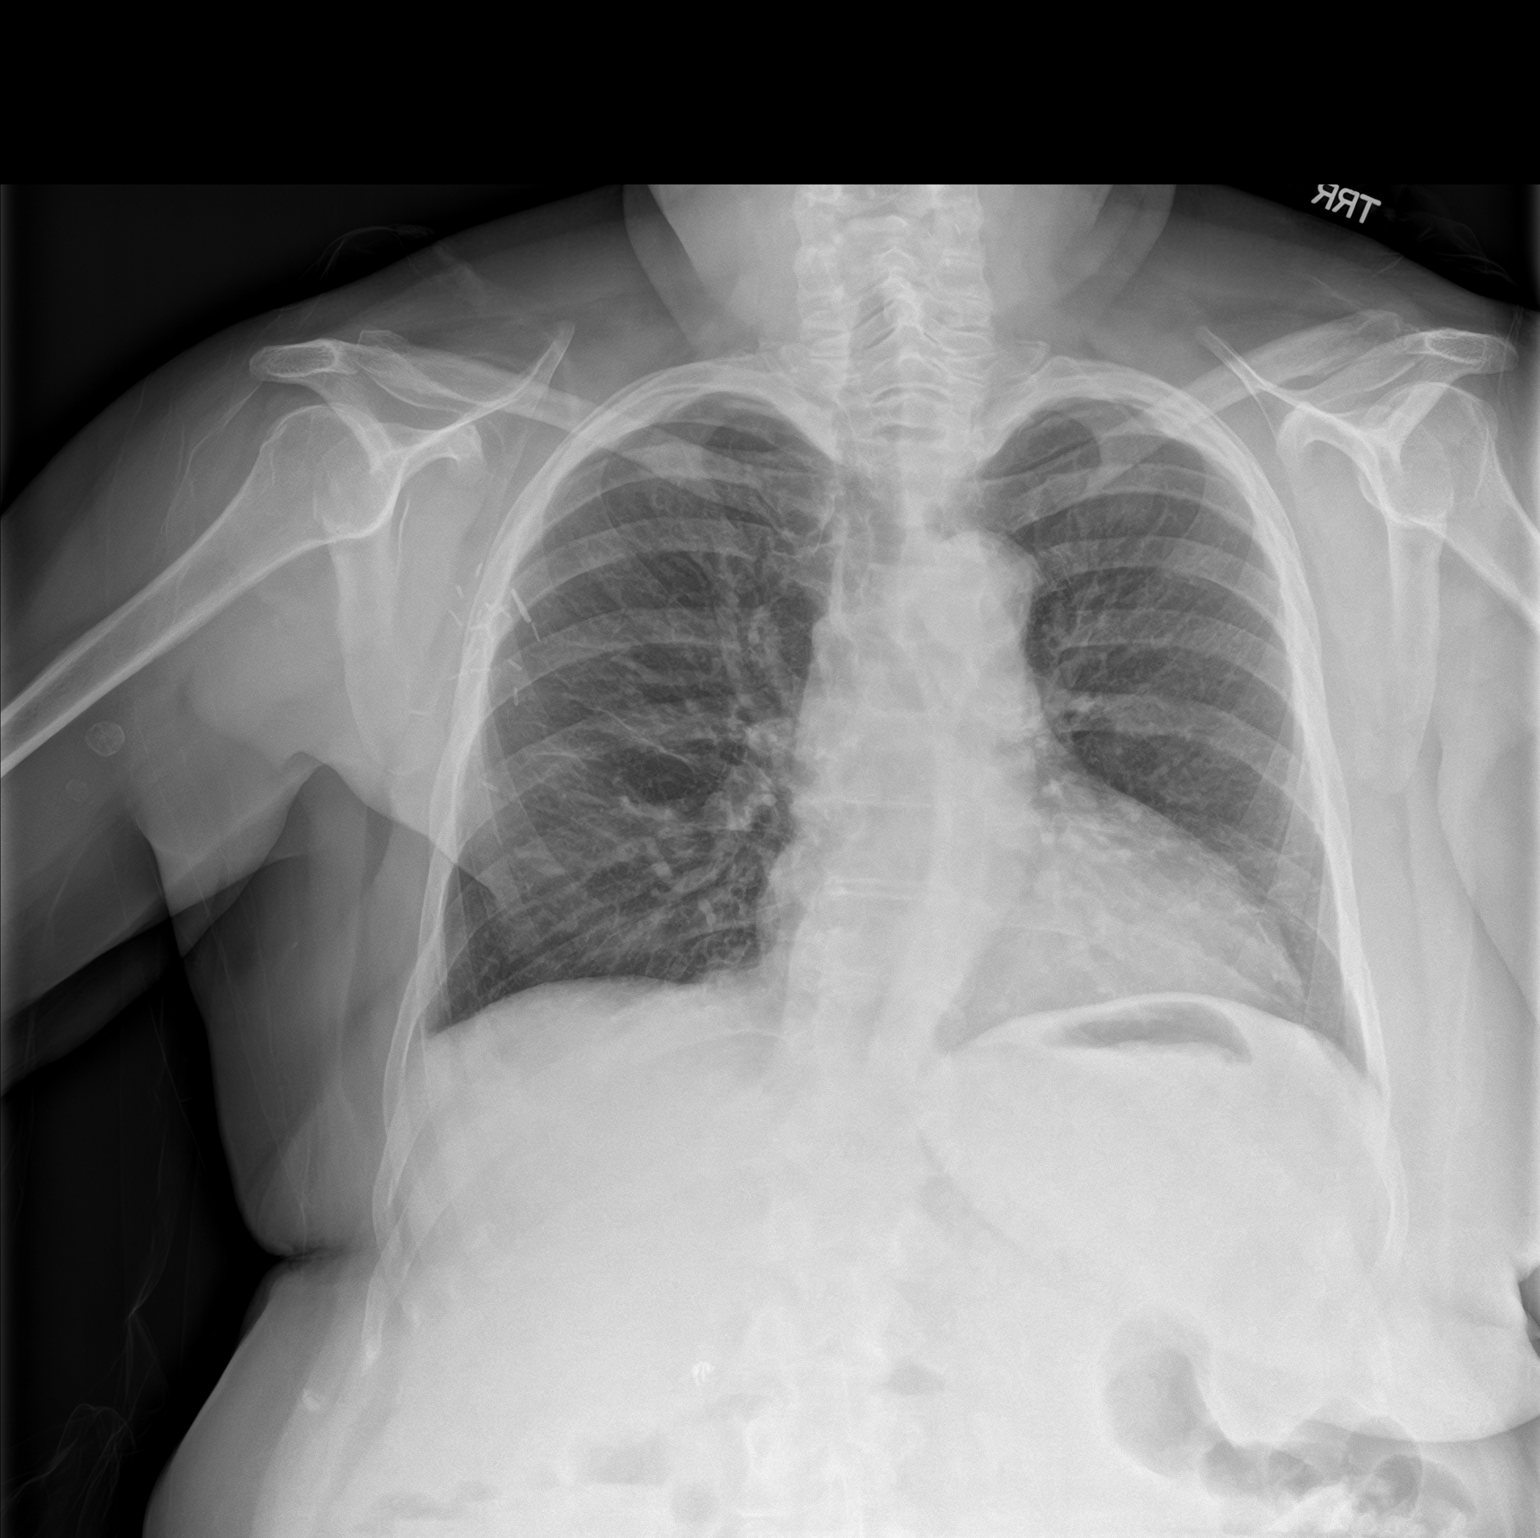
[im 2/2]
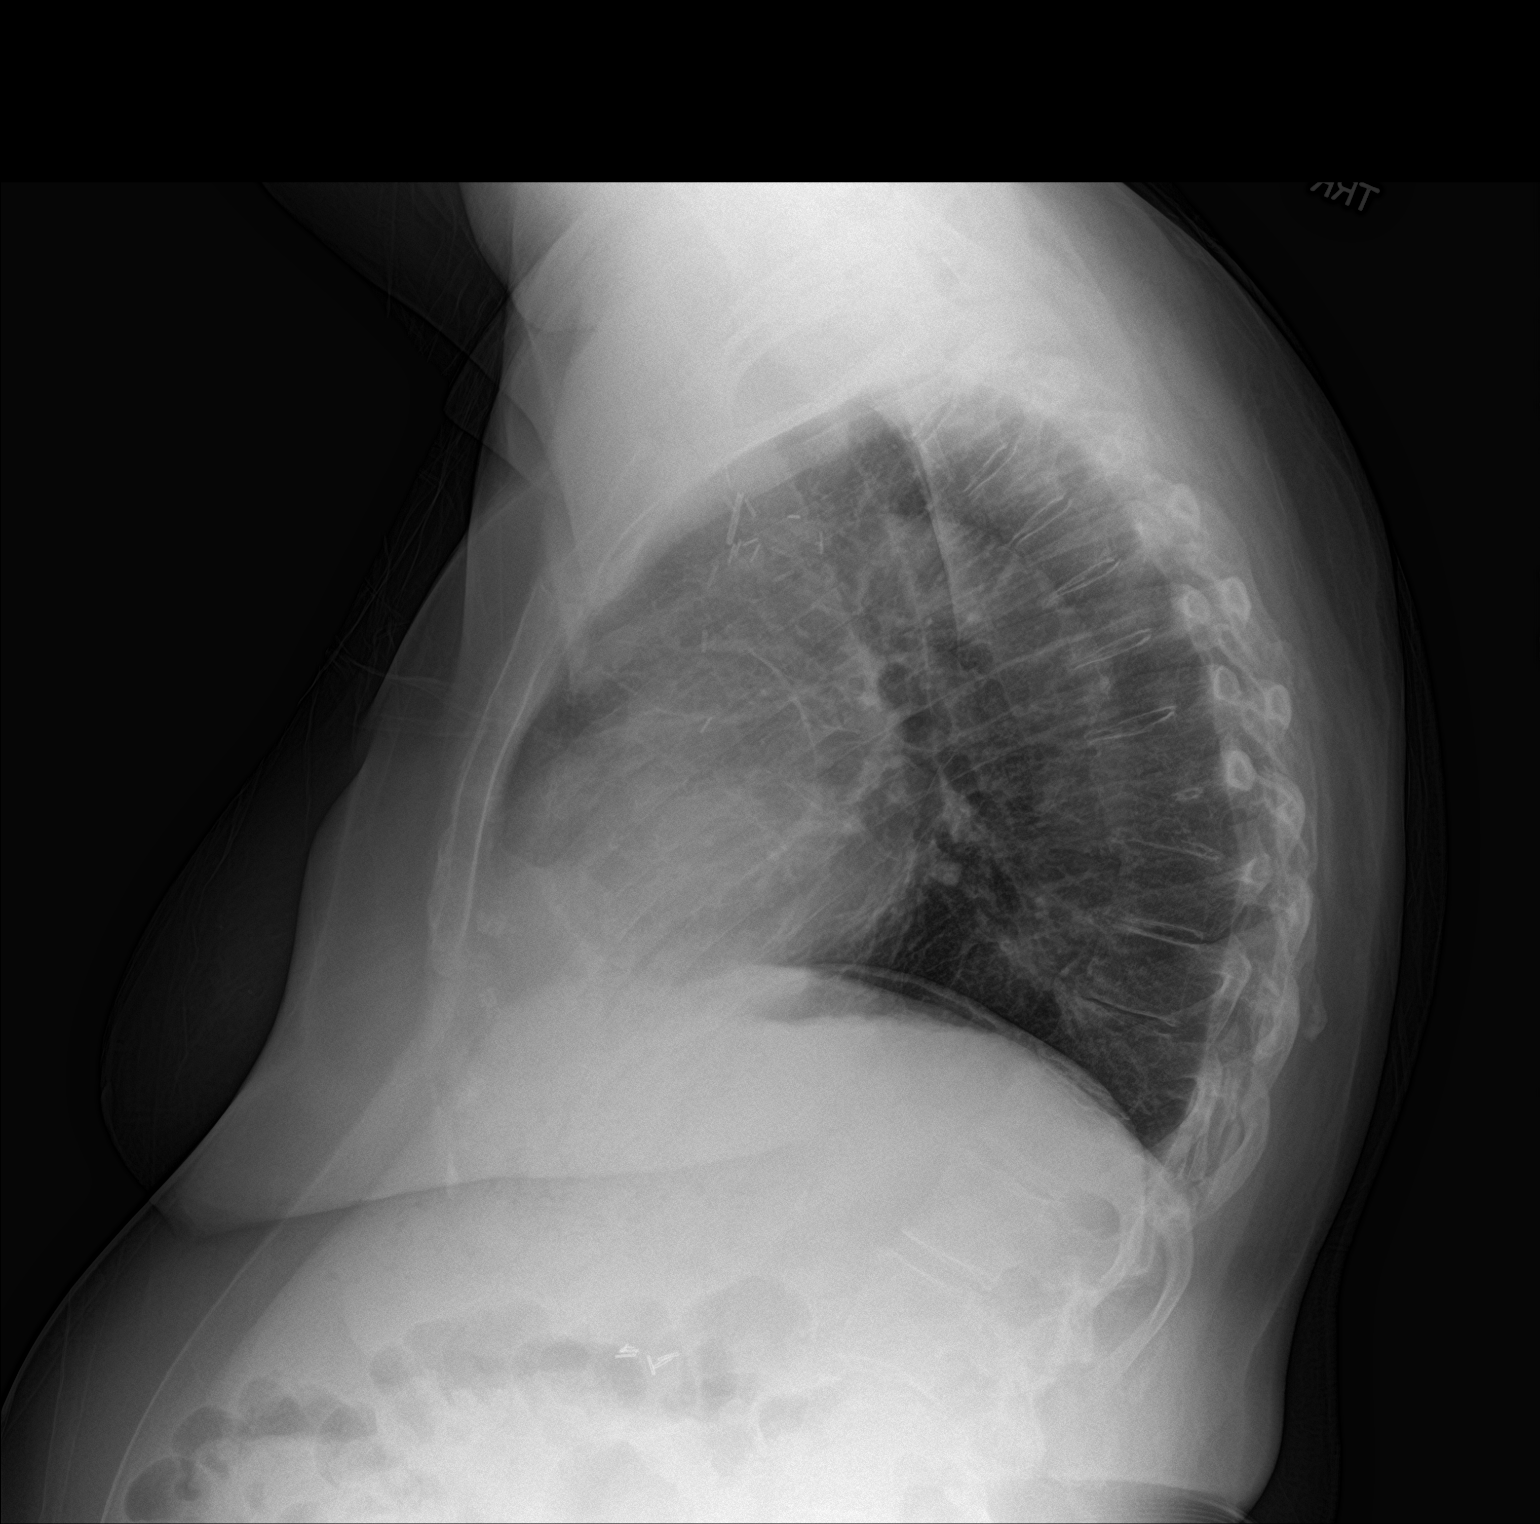

[2 of 2 positions shown; findings below may reference images not displayed]

FINDINGS: Cardiac shadow is mildly enlarged. The lungs are well aerated
bilaterally. No focal infiltrate or sizable effusion is seen.
Postsurgical changes in the right axilla are noted. Prior right
mastectomy is again noted. No acute bony abnormality is seen. Stable
compression deformity in the midthoracic spine is noted dating back
to 1333.
IMPRESSION: No acute abnormality noted.

## 2020-09-19 IMAGING — CT CT ANGIO CHEST
1 of 6 series · 19 of 36 positions shown · IV contrast (APPLIED)
Comparison: None.

CLINICAL DATA: Medial chest pain radiating to the jaw. Shortness of
breath.

EXAM:
CT ANGIOGRAPHY CHEST WITH CONTRAST
TECHNIQUE: Multidetector CT imaging of the chest was performed using the
standard protocol during bolus administration of intravenous
contrast. Multiplanar CT image reconstructions and MIPs were
obtained to evaluate the vascular anatomy.
CONTRAST:  60mL OMNIPAQUE IOHEXOL 350 MG/ML SOLN

[Series 5: thins · axial · 0.57mm/px · z∈[-400,-208]mm · 19 of 214 slices shown]
[im 11/214  lung]
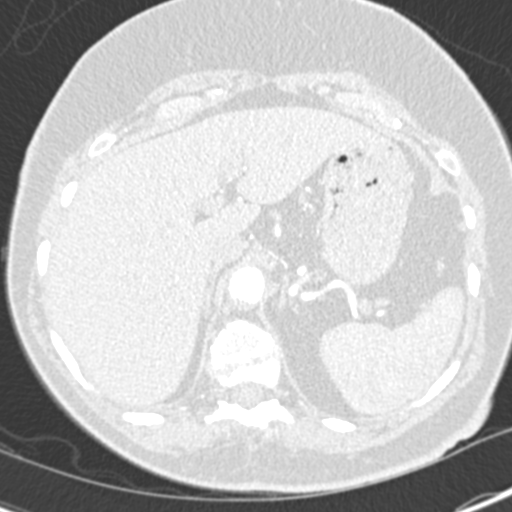
[im 22/214  mediastinal]
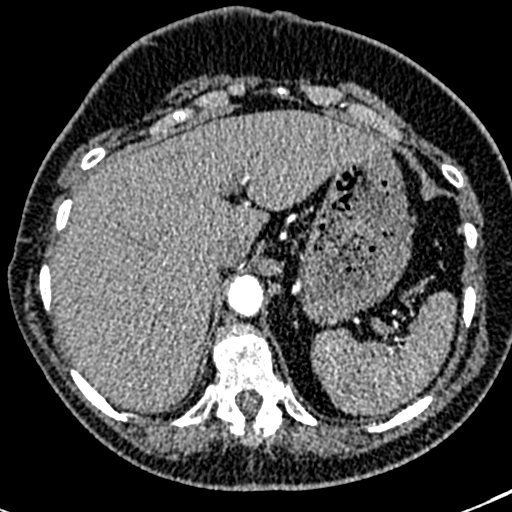
[im 32/214  lung]
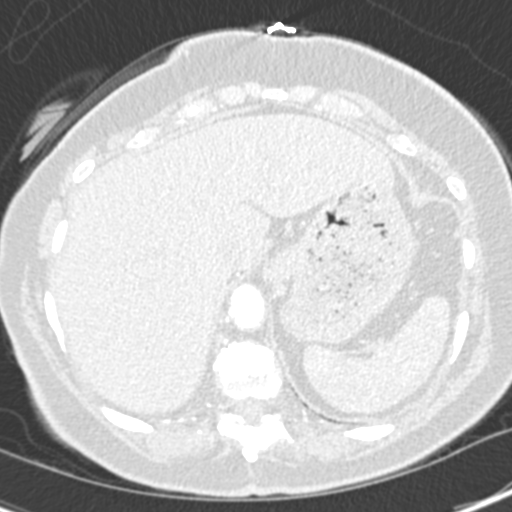
[im 43/214  mediastinal]
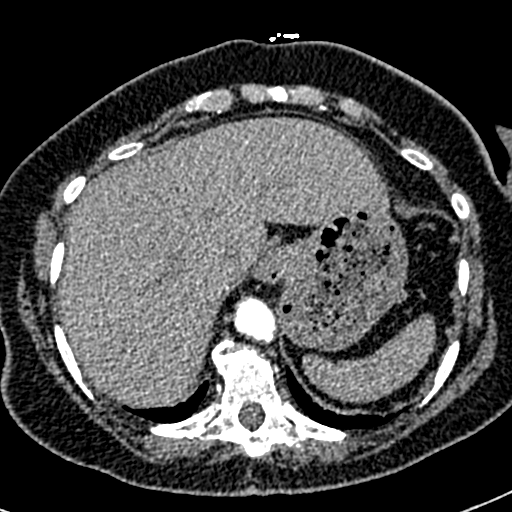
[im 54/214  lung]
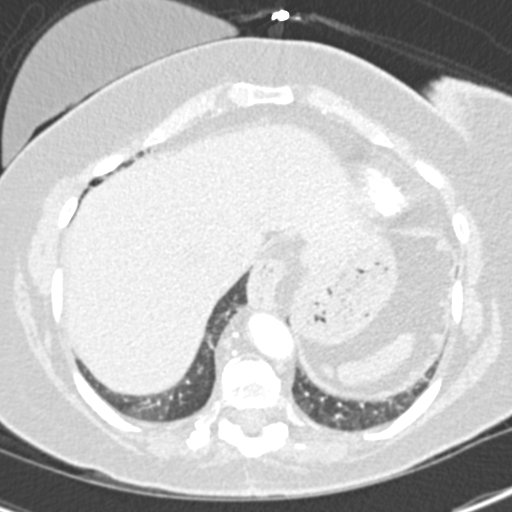
[im 64/214  mediastinal]
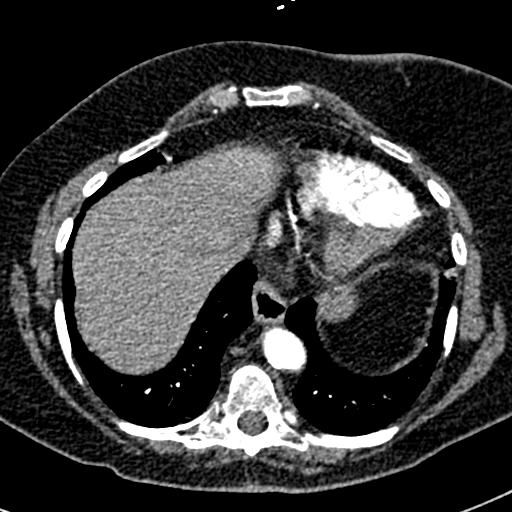
[im 75/214  lung]
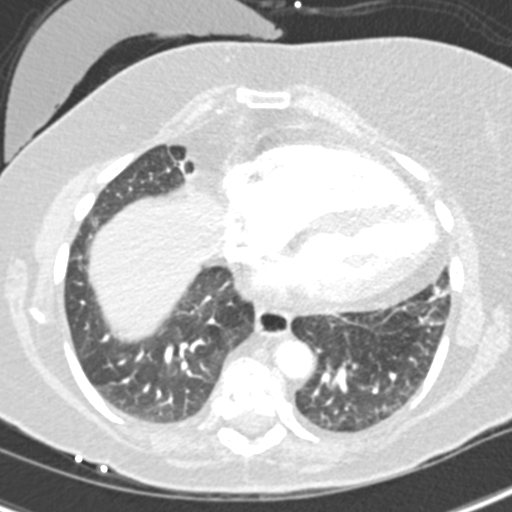
[im 86/214  mediastinal]
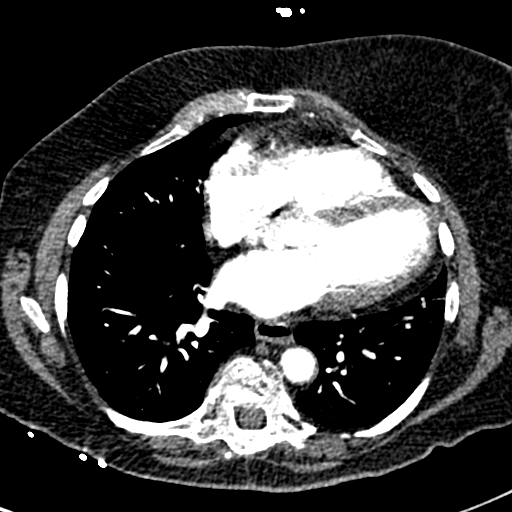
[im 96/214  lung]
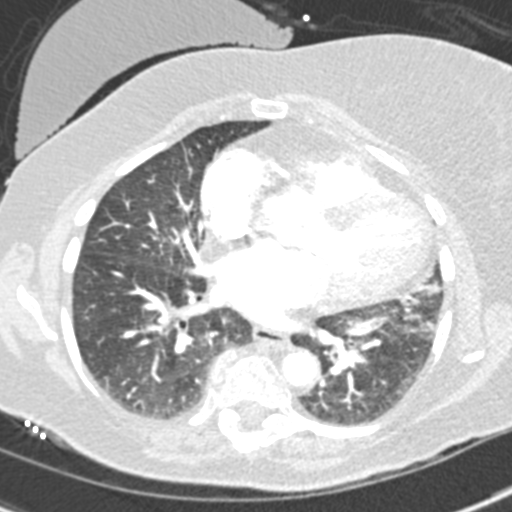
[im 107/214  mediastinal]
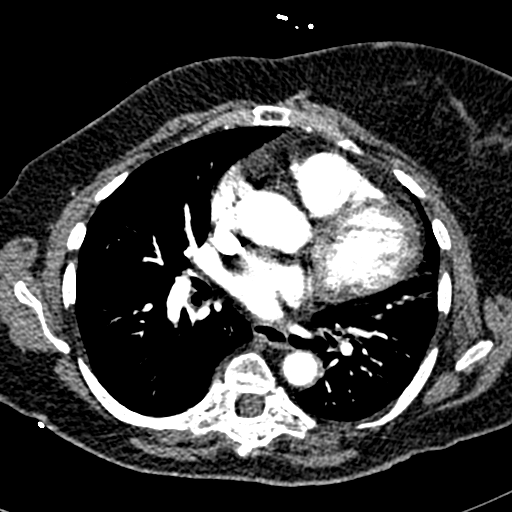
[im 118/214  lung]
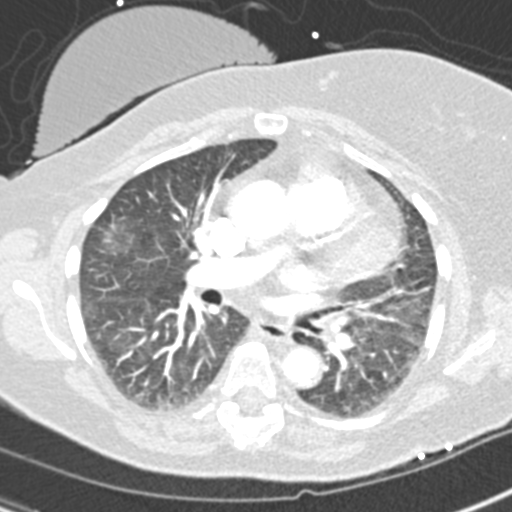
[im 128/214  mediastinal]
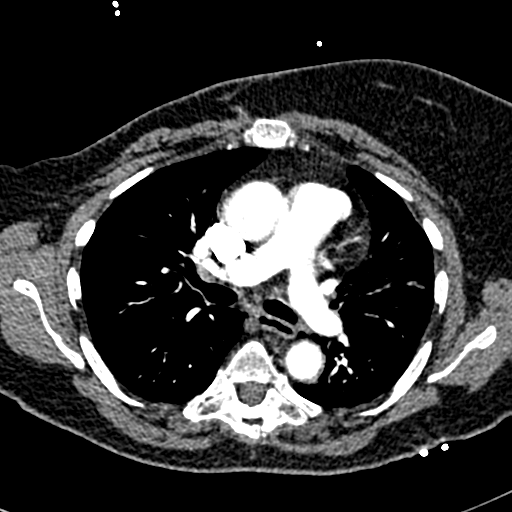
[im 139/214  lung]
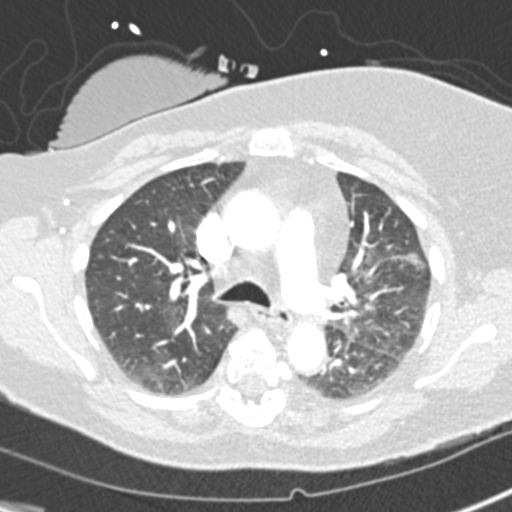
[im 150/214  mediastinal]
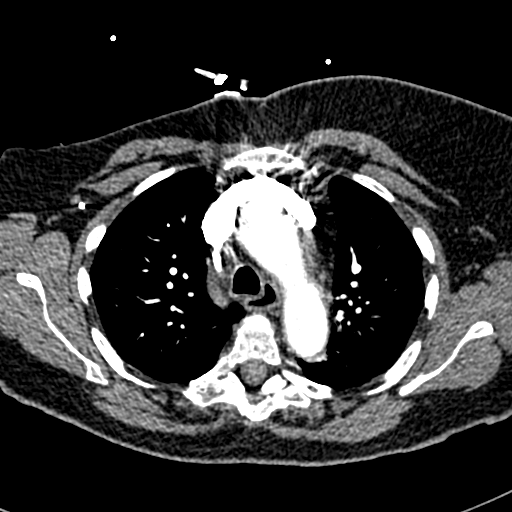
[im 160/214  lung]
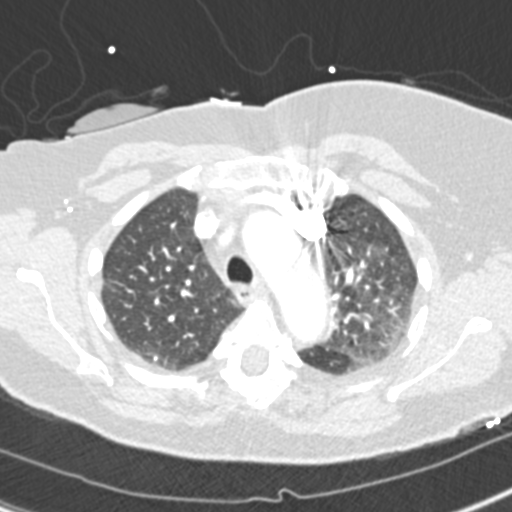
[im 171/214  mediastinal]
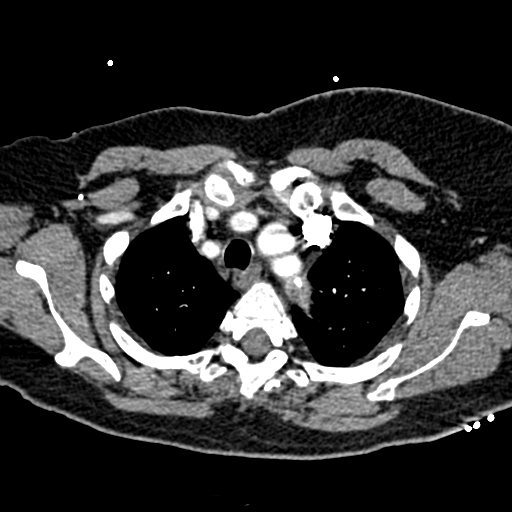
[im 182/214  lung]
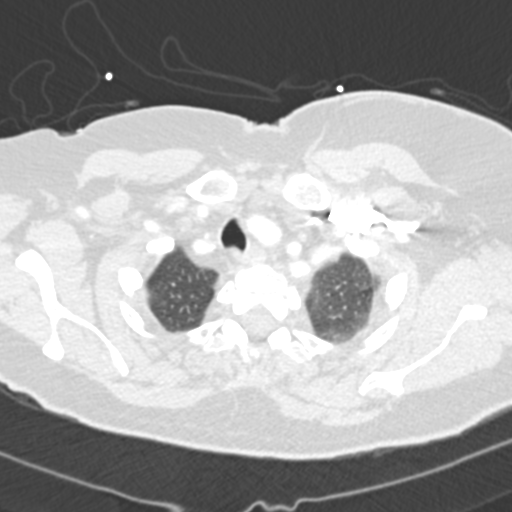
[im 192/214  mediastinal]
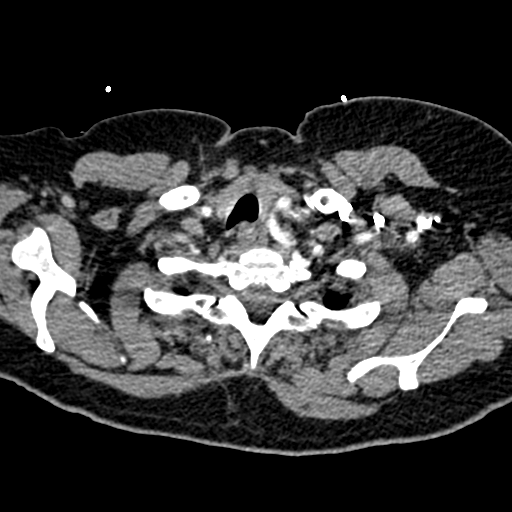
[im 203/214  lung]
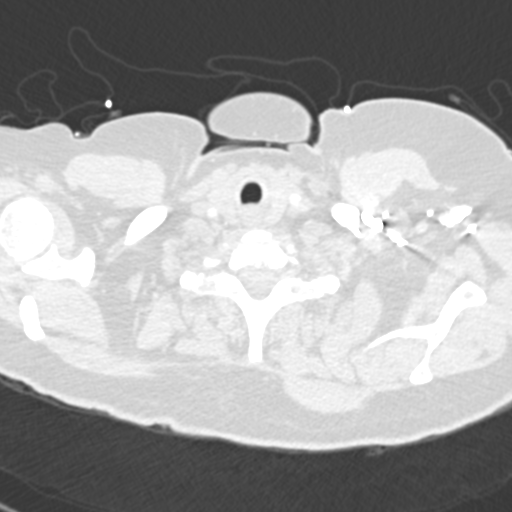

[19 of 36 positions shown; findings below may reference images not displayed]

FINDINGS: Cardiovascular: Good opacification of the central and segmental
pulmonary arteries. No focal filling defects. No evidence of
significant pulmonary embolus. Cardiac enlargement. No pericardial
effusions. Coronary artery calcifications. Scattered aortic
calcifications. No aortic aneurysm or dissection. Great vessel
origins are patent.

Mediastinum/Nodes: Esophagus is decompressed. No significant
lymphadenopathy in the chest.

Lungs/Pleura: Motion artifact limits examination. There is hazy
perihilar opacity suggesting possible edema. No focal consolidation.
No pleural effusions. No pneumothorax. Airways are patent.

Upper Abdomen: No acute abnormalities.

Musculoskeletal: Previous right mastectomy with surgical clips in
the right axilla. Degenerative changes in the spine. Compression of
a midthoracic vertebra is unchanged since prior studies.

Review of the MIP images confirms the above findings.
IMPRESSION: 1. No evidence of significant pulmonary embolus.
2. Cardiac enlargement with perihilar edema.
3. Chronic midthoracic vertebral compression deformity.

Aortic Atherosclerosis (COTR6-913.3).

## 2020-09-24 ENCOUNTER — Telehealth: Payer: Self-pay | Admitting: Family Medicine

## 2020-09-24 NOTE — Chronic Care Management (AMB) (Signed)
  Chronic Care Management   Note  09/24/2020 Name: Katie [name] MRN: [id number] DOB: 18-Sep-1945  Katie [name] is a 75 y.o. year old female who is a primary care patient of [name], MD. I reached out to Alexis Frock by phone today in response to a referral sent by [name]'s PCP, [name], MD.   [name] was given information about Chronic Care Management services today including:  1. CCM service includes personalized support from designated clinical staff supervised by her physician, including individualized plan of care and coordination with other care providers 2. 24/7 contact phone numbers for assistance for urgent and routine care needs. 3. Service will only be billed when office clinical staff spend 20 minutes or more in a month to coordinate care. 4. Only one practitioner may furnish and bill the service in a calendar month. 5. The patient may stop CCM services at any time (effective at the end of the month) by phone call to the office staff.   Patient agreed to services and verbal consent obtained.   Follow up plan:   Lauretta Grill [employer] Scheduler

## 2020-10-16 ENCOUNTER — Encounter: Payer: Self-pay | Admitting: Family Medicine

## 2020-10-16 ENCOUNTER — Ambulatory Visit (INDEPENDENT_AMBULATORY_CARE_PROVIDER_SITE_OTHER): Payer: Medicare HMO | Admitting: Family Medicine

## 2020-10-16 ENCOUNTER — Other Ambulatory Visit: Payer: Self-pay

## 2020-10-16 ENCOUNTER — Other Ambulatory Visit: Payer: Medicare HMO

## 2020-10-16 VITALS — BP 142/82 | HR 92 | Temp 96.8°F | Wt 167.4 lb

## 2020-10-16 DIAGNOSIS — E119 Type 2 diabetes mellitus without complications: Secondary | ICD-10-CM | POA: Diagnosis not present

## 2020-10-16 LAB — POCT GLYCOSYLATED HEMOGLOBIN (HGB A1C): Hemoglobin A1C: 6.6 % — AB (ref 4.0–5.6)

## 2020-10-16 MED ORDER — HYDROCHLOROTHIAZIDE 12.5 MG PO TABS: 6.2500 mg | ORAL_TABLET | Freq: Every day | ORAL | 3 refills | Status: DC

## 2020-10-16 NOTE — Progress Notes (Signed)
This visit occurred during the SARS-CoV-2 public health emergency.  Safety protocols were in place, including screening questions prior to the visit, additional usage of staff PPE, and extensive cleaning of exam room while observing appropriate contact time as indicated for disinfecting solutions.  Diabetes:  No meds.   Hypoglycemic episodes: no sx recently, only rare sx in the past with prolonged fasting.   Hyperglycemic episodes: no sx.   Feet problems:no numbness.   Blood Sugars averaging: not checked.  She is working on diet and exercise.   She had higher BP and L ankle edema, B hand swelling noted.    Meds, vitals, and allergies reviewed.  ROS: Per HPI unless specifically indicated in ROS section   GEN: nad, alert and oriented HEENT: ncat NECK: supple w/o LA CV: rrr. PULM: ctab, no inc wob EXT: no edema R ankle, minimal L ankle edema.   SKIN: no acute rash

## 2020-10-16 NOTE — Patient Instructions (Addendum)
Add on HCTZ 1/2 tab a day. Update me as needed.   Plan on recheck with A1c at visit in about 4 months.  Take care.  Glad to see you.

## 2020-10-18 NOTE — Assessment & Plan Note (Signed)
No change in medications for diabetes. Add on HCTZ 1/2 tab a day regarding ankle edema with routine cautions discussed with patient. Update me as needed.   Plan on recheck with A1c at visit in about 4 months.  Continue work on diet and excise.  A1c discussed with patient.  She agrees.

## 2020-10-23 ENCOUNTER — Telehealth: Payer: Self-pay

## 2020-10-23 NOTE — Chronic Care Management (AMB) (Addendum)
    Chronic Care Management Pharmacy Assistant   Name: Katie [name]  MRN: [id number] DOB: 01-12-1946  Katie [name] is an 75 y.o. year old female who presents for her initial CCM visit with the clinical pharmacist.  Reason for Encounter: Initial Questions  Recent office visits:  10/16/2020  Dr.Duncan PCP added hydrochlorothiazide 6.25 mg 1/2 tablet daily  06/18/2020  Dr.Graham Damita Dunnings PCP added Nystatin Cream apply topically 2 times daily  Follow up 4 months   Recent consult visits:  05/13/2020 [facility]  No data available   Hospital visits:  None in previous 6 months  Medications: Outpatient Encounter Medications as of 10/23/2020  Medication Sig   albuterol (VENTOLIN HFA) 108 (90 Base) MCG/ACT inhaler Inhale 2 puffs into the lungs every 6 (six) hours as needed for wheezing or shortness of breath. Hold for patient to request.   aspirin 325 MG EC tablet Take 325 mg by mouth daily. Reported on 12/01/2015   Cholecalciferol (VITAMIN D3) 125 MCG (5000 UT) CAPS Take 1 capsule by mouth daily.   estradiol (ESTRACE) 0.1 MG/GM vaginal cream Use as directed, 1 tube/month.  Do not dispense med from [facility]   fluticasone (FLONASE) 50 MCG/ACT nasal spray 2 sprays in each nostril once a day   fluticasone (FLOVENT HFA) 110 MCG/ACT inhaler Inhale 2 puffs into the lungs 2 (two) times daily. Rinse after use.   hydrochlorothiazide (HYDRODIURIL) 12.5 MG tablet Take 0.5 tablets (6.25 mg total) by mouth daily.   lisinopril (ZESTRIL) 10 MG tablet Take 1 tablet (10 mg total) by mouth daily.   meloxicam (MOBIC) 7.5 MG tablet Take 1 tablet (7.5 mg total) by mouth 2 (two) times daily.   Multiple Vitamin (MULTIVITAMIN) tablet Take 1 tablet by mouth daily.   nystatin cream (MYCOSTATIN) Apply 1 application topically 2 (two) times daily.   omeprazole (PRILOSEC OTC) 20 MG tablet Take 1 tablet (20 mg total) by mouth daily.   pravastatin (PRAVACHOL) 80 MG tablet Take 1 tablet (80 mg total) by mouth  daily.   Probiotic Product ([address]) CAPS Take by mouth daily. Reported on 12/01/2015   No facility-administered encounter medications on file as of 10/23/2020.     Lab Results  Component Value Date/Time   HGBA1C 6.6 (A) 10/16/2020 10:46 AM   HGBA1C 6.6 (H) 06/11/2020 08:16 AM   HGBA1C 6.2 (A) 02/14/2020 08:18 AM   HGBA1C 7.0 (H) 10/02/2019 08:19 AM   MICROALBUR 1.2 12/17/2010 08:51 AM   MICROALBUR 1.5 08/17/2009 09:07 AM     BP Readings from Last 3 Encounters:  10/16/20 (!) 142/82  06/18/20 130/68  02/14/20 122/70   Katie [name] sent a message stating that she was declining CCM services at this time. She cancelled her Initial Visit appointment with  Debbora Dus, Pharm. D, for her telephone visit on 10/29/2020  at 8:30 AM. She will be unenrolled from CCM effective 11/03/20.    Star Rating Drugs:  Medication:  Last Fill: Day Supply Lisinopril 10mg  09/01/20 90 pravastatin 80mg  09/29/2020 90   Follow-Up:  Pharmacist Review  Debbora Dus, CPP notified  Avel Sensor [facility] Clinical Pharmacy Assistant [phone] I have reviewed the care management and care coordination activities outlined in this encounter and I am certifying that I agree with the content of this note. No further action required.  Debbora Dus, PharmD Clinical Pharmacist [facility] [phone]

## 2020-10-27 ENCOUNTER — Encounter: Payer: Self-pay | Admitting: Family Medicine

## 2020-10-28 ENCOUNTER — Telehealth: Payer: Self-pay | Admitting: Family Medicine

## 2020-10-28 ENCOUNTER — Ambulatory Visit (INDEPENDENT_AMBULATORY_CARE_PROVIDER_SITE_OTHER): Payer: Medicare HMO | Admitting: Family Medicine

## 2020-10-28 ENCOUNTER — Other Ambulatory Visit: Payer: Self-pay | Admitting: Family Medicine

## 2020-10-28 ENCOUNTER — Encounter: Payer: Self-pay | Admitting: *Deleted

## 2020-10-28 ENCOUNTER — Other Ambulatory Visit (INDEPENDENT_AMBULATORY_CARE_PROVIDER_SITE_OTHER): Payer: Medicare HMO

## 2020-10-28 VITALS — HR 111 | Temp 98.0°F

## 2020-10-28 DIAGNOSIS — U071 COVID-19: Secondary | ICD-10-CM

## 2020-10-28 HISTORY — DX: COVID-19: U07.1

## 2020-10-28 LAB — BASIC METABOLIC PANEL
BUN: 26 mg/dL — ABNORMAL HIGH (ref 6–23)
CO2: 24 mEq/L (ref 19–32)
Calcium: 9.7 mg/dL (ref 8.4–10.5)
Chloride: 100 mEq/L (ref 96–112)
Creatinine, Ser: 1.27 mg/dL — ABNORMAL HIGH (ref 0.40–1.20)
GFR: 41.86 mL/min — ABNORMAL LOW (ref >60.00)
Glucose, Bld: 160 mg/dL — ABNORMAL HIGH (ref 70–99)
Potassium: 4.5 mEq/L (ref 3.5–5.1)
Sodium: 135 mEq/L (ref 135–145)

## 2020-10-28 MED ORDER — NIRMATRELVIR/RITONAVIR (PAXLOVID) TABLET (RENAL DOSING): 2.0000 | ORAL_TABLET | Freq: Two times a day (BID) | ORAL | 0 refills | Status: AC

## 2020-10-28 MED ORDER — BENZONATATE 200 MG PO CAPS: 200.0000 mg | ORAL_CAPSULE | Freq: Three times a day (TID) | ORAL | 1 refills | Status: DC | PRN
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
## 2020-10-28 NOTE — Assessment & Plan Note (Signed)
covid 54 in immunized female with 3 days of respiratory symptoms and low grade fever  Tessalon px for cough  Low threshold for prednisone if she begins to wheeze (will update [place]) Interested in tx with paxlovid  Orders done for bmet to get GFR for this  Will need to hold statin  ER parameters discussed -shortness of breath

## 2020-10-28 NOTE — Telephone Encounter (Signed)
Patient sent me a note about her positive COVID test.  I wanted to check with you in the meantime.  If you have already addressed this, then I appreciate it and I will defer to you.  Please let me know how I can help otherwise.  Many thanks.

## 2020-10-28 NOTE — Patient Instructions (Signed)
Drink fluids and rest  Continue symptom care  Try tessalon for cough  Watch for wheezing or tight chest  The office will call to set up labs for kidney function so we can start the antiviral medicine for covid   If symptoms worsen please call If severe go to the ER

## 2020-10-28 NOTE — Telephone Encounter (Signed)
I just got her bmet and sent in Paxlovid (renal dosing)

## 2020-10-28 NOTE — Telephone Encounter (Signed)
I sent paxlovid to her pharmacy to start when she gets it  Please alert me of any problems/side effects   Please keep [place] posted re: symptoms  Also check in with her friday

## 2020-10-28 NOTE — Telephone Encounter (Signed)
I thank Dr. Glori Bickers for her help.

## 2020-10-29 ENCOUNTER — Telehealth: Payer: Medicare HMO

## 2020-10-29 NOTE — Telephone Encounter (Signed)
Pt notified Rx sent to pharmacy I advise pt of Dr. Marliss Coots comments

## 2020-11-09 ENCOUNTER — Encounter: Payer: Self-pay | Admitting: Ophthalmology

## 2020-11-18 NOTE — Discharge Instructions (Addendum)
INSTRUCTIONS FOLLOWING OCULOPLASTIC SURGERY AMY Dennie Maizes, MD  AFTER YOUR EYE SURGERY, THER ARE MANY THINGS WHICH YOU, THE PATIENT, CAN DO TO ASSURE THE BEST POSSIBLE RESULT FROM YOUR OPERATION.  THIS SHEET SHOULD BE REFERRED TO WHENEVER QUESTIONS ARISE.  IF THERE ARE ANY QUESTIONS NOT ANSWERED HERE, DO NOT HESITATE TO CALL OUR OFFICE AT [phone] OR [phone].  THERE IS ALWAYS SOMEONE AVAILABLE TO CALL IF QUESTIONS OR PROBLEMS ARISE.  VISION: Your vision may be blurred and out of focus after surgery until you are able to stop using your ointment, swelling resolves and your eye(s) heal. This may take 1 to 2 weeks at the least.  If your vision becomes gradually more dim or dark, this is not normal and you need to call our office immediately.  EYE CARE: For the first 48 hours after surgery, use ice packs frequently - "20 minutes on, 20 minutes off" - to help reduce swelling and bruising.  Small bags of frozen peas or corn make good ice packs along with cloths soaked in ice water.  If you are wearing a patch or other type of dressing following surgery, keep this on for the amount of time specified by your doctor.  For the first week following surgery, you will need to treat your stitches with great care.  It is OK to shower, but take care to not allow soapy water to run into your eye(s) to help reduce chances of infection.  You may gently clean the eyelashes and around the eye(s) with cotton balls and sterile water, BUT DO NOT RUB THE STITCHES VIGOROUSLY.  Keeping your stitches moist with ointment will help promote healing with minimal scar formation.  ACTIVITY: When you leave the surgery center, you should go home, rest and be inactive.  The eye(s) may feel scratchy and keeping the eyes closed will allow for faster healing.  The first week following surgery, avoid straining (anything making the face turn red) or lifting over 20 pounds.  Additionally, avoid bending which causes your head to go below  your waist.  Using your eyes will NOT harm them, so feel free to read, watch television, use the computer, etc as desired.  Driving depends on each individual, so check with your doctor if you have questions about driving. Do not wear contact lenses for about 2 weeks.  Do not wear eye makeup for 2 weeks.  Avoid swimming, hot tubs, gardening, and dusting for 1 to 2 weeks to reduce the risk of an infection.  MEDICATIONS:  You will be given a prescription for an ointment to use 4 times a day on your stitches.  You can use the ointment in your eyes if they feel scratchy or irritated.  If you eyelid(s) don't close completely when you sleep, put some ointment in your eyes before bedtime.  EMERGENCY: If you experience SEVERE EYE PAIN OR HEADACHE UNRELIEVED BY TYLENOL OR TRAMADOL, NAUSEA OR VOMITING, WORSENING REDNESS, OR WORSENING VISION (ESPECIALLY VISION THAT WAS INITIALLY BETTER) CALL [phone] OR [phone] DURING BUSINESS HOURS OR AFTER HOURS. INSTRUCTIONS FOLLOWING OCULOPLASTIC SURGERY AMY Dennie Maizes, MD  AFTER YOUR EYE SURGERY, THER ARE MANY THINGS WHICH YOU, THE PATIENT, CAN DO TO ASSURE THE BEST POSSIBLE RESULT FROM YOUR OPERATION.  THIS SHEET SHOULD BE REFERRED TO WHENEVER QUESTIONS ARISE.  IF THERE ARE ANY QUESTIONS NOT ANSWERED HERE, DO NOT HESITATE TO CALL OUR OFFICE AT [phone] OR [phone].  THERE IS ALWAYS SOMEONE AVAILABLE TO CALL IF QUESTIONS OR PROBLEMS ARISE.  VISION:  Your vision may be blurred and out of focus after surgery until you are able to stop using your ointment, swelling resolves and your eye(s) heal. This may take 1 to 2 weeks at the least.  If your vision becomes gradually more dim or dark, this is not normal and you need to call our office immediately.  EYE CARE: For the first 48 hours after surgery, use ice packs frequently - "20 minutes on, 20 minutes off" - to help reduce swelling and bruising.  Small bags of frozen peas or corn make good ice packs along with  cloths soaked in ice water.  If you are wearing a patch or other type of dressing following surgery, keep this on for the amount of time specified by your doctor.  For the first week following surgery, you will need to treat your stitches with great care.  It is OK to shower, but take care to not allow soapy water to run into your eye(s) to help reduce chances of infection.  You may gently clean the eyelashes and around the eye(s) with cotton balls and sterile water, BUT DO NOT RUB THE STITCHES VIGOROUSLY.  Keeping your stitches moist with ointment will help promote healing with minimal scar formation.  ACTIVITY: When you leave the surgery center, you should go home, rest and be inactive.  The eye(s) may feel scratchy and keeping the eyes closed will allow for faster healing.  The first week following surgery, avoid straining (anything making the face turn red) or lifting over 20 pounds.  Additionally, avoid bending which causes your head to go below your waist.  Using your eyes will NOT harm them, so feel free to read, watch television, use the computer, etc as desired.  Driving depends on each individual, so check with your doctor if you have questions about driving. Do not wear contact lenses for about 2 weeks.  Do not wear eye makeup for 2 weeks.  Avoid swimming, hot tubs, gardening, and dusting for 1 to 2 weeks to reduce the risk of an infection.  MEDICATIONS:  You will be given a prescription for an ointment to use 4 times a day on your stitches.  You can use the ointment in your eyes if they feel scratchy or irritated.  If you eyelid(s) don't close completely when you sleep, put some ointment in your eyes before bedtime.  EMERGENCY: If you experience SEVERE EYE PAIN OR HEADACHE UNRELIEVED BY TYLENOL OR TRAMADOL, NAUSEA OR VOMITING, WORSENING REDNESS, OR WORSENING VISION (ESPECIALLY VISION THAT WAS INITIALLY BETTER) CALL [phone] OR [phone] DURING BUSINESS HOURS OR AFTER HOURS.

## 2020-11-20 ENCOUNTER — Encounter: Payer: Self-pay | Admitting: Ophthalmology

## 2020-11-20 ENCOUNTER — Other Ambulatory Visit: Payer: Self-pay

## 2020-11-20 ENCOUNTER — Ambulatory Visit: Payer: Medicare HMO | Admitting: Anesthesiology

## 2020-11-20 ENCOUNTER — Ambulatory Visit
Admission: RE | Admit: 2020-11-20 | Discharge: 2020-11-20 | Disposition: A | Discharge status: Home / Self Care | Payer: Medicare HMO | Attending: Ophthalmology | Admitting: Ophthalmology

## 2020-11-20 ENCOUNTER — Encounter
Admission: RE | Disposition: A | Discharge status: Home / Self Care | Payer: Self-pay | Source: Home / Self Care | Attending: Ophthalmology

## 2020-11-20 DIAGNOSIS — H02403 Unspecified ptosis of bilateral eyelids: Secondary | ICD-10-CM | POA: Diagnosis not present

## 2020-11-20 DIAGNOSIS — H02833 Dermatochalasis of right eye, unspecified eyelid: Secondary | ICD-10-CM | POA: Diagnosis present

## 2020-11-20 DIAGNOSIS — H02831 Dermatochalasis of right upper eyelid: Secondary | ICD-10-CM | POA: Diagnosis not present

## 2020-11-20 DIAGNOSIS — H02834 Dermatochalasis of left upper eyelid: Secondary | ICD-10-CM | POA: Diagnosis not present

## 2020-11-20 HISTORY — DX: Unspecified osteoarthritis, unspecified site: M19.90

## 2020-11-20 HISTORY — DX: Motion sickness, initial encounter: T75.3XXA

## 2020-11-20 HISTORY — DX: Failed or difficult intubation, initial encounter: T88.4XXA

## 2020-11-20 HISTORY — PX: BROW LIFT: SHX178

## 2020-11-20 SURGERY — BLEPHAROPLASTY
Anesthesia: Monitor Anesthesia Care | Site: Eye | Laterality: Bilateral

## 2020-11-20 MED ORDER — ACETAMINOPHEN 325 MG PO TABS: 325.0000 mg | ORAL_TABLET | Freq: Once | ORAL | Status: DC

## 2020-11-20 MED ORDER — BSS IO SOLN
INTRAOCULAR | Status: DC | PRN
  Administered 2020-11-20: 15 mL

## 2020-11-20 MED ORDER — MIDAZOLAM HCL 2 MG/2ML IJ SOLN
INTRAMUSCULAR | Status: DC | PRN
  Administered 2020-11-20: 2 mg via INTRAVENOUS

## 2020-11-20 MED ORDER — DEXMEDETOMIDINE HCL 200 MCG/2ML IV SOLN
INTRAVENOUS | Status: DC | PRN
  Administered 2020-11-20 (×4): 5 ug via INTRAVENOUS

## 2020-11-20 MED ORDER — ONDANSETRON HCL 4 MG/2ML IJ SOLN
INTRAMUSCULAR | Status: DC | PRN
  Administered 2020-11-20: 4 mg via INTRAVENOUS

## 2020-11-20 MED ORDER — LIDOCAINE HCL (CARDIAC) PF 100 MG/5ML IV SOSY
PREFILLED_SYRINGE | INTRAVENOUS | Status: DC | PRN
  Administered 2020-11-20: 30 mg via INTRAVENOUS

## 2020-11-20 MED ORDER — DIPHENHYDRAMINE HCL 50 MG/ML IJ SOLN
INTRAMUSCULAR | Status: DC | PRN
  Administered 2020-11-20: 12.5 mg via INTRAVENOUS

## 2020-11-20 MED ORDER — OXYCODONE-ACETAMINOPHEN 5-325 MG PO TABS: 1.0000 | ORAL_TABLET | ORAL | 0 refills | Status: DC | PRN

## 2020-11-20 MED ORDER — LACTATED RINGERS IV SOLN: INTRAVENOUS | Status: DC

## 2020-11-20 MED ORDER — ERYTHROMYCIN 5 MG/GM OP OINT: TOPICAL_OINTMENT | OPHTHALMIC | 2 refills | Status: DC

## 2020-11-20 MED ORDER — TETRACAINE HCL 0.5 % OP SOLN
OPHTHALMIC | Status: DC | PRN
  Administered 2020-11-20: 1 [drp] via OPHTHALMIC

## 2020-11-20 MED ORDER — PROPOFOL 500 MG/50ML IV EMUL
INTRAVENOUS | Status: DC | PRN
  Administered 2020-11-20 (×2): 20 mg via INTRAVENOUS

## 2020-11-20 MED ORDER — ALFENTANIL 500 MCG/ML IJ INJ
INJECTION | INTRAVENOUS | Status: DC | PRN
  Administered 2020-11-20: 500 ug via INTRAVENOUS
  Administered 2020-11-20 (×2): 250 ug via INTRAVENOUS

## 2020-11-20 MED ORDER — ERYTHROMYCIN 5 MG/GM OP OINT
TOPICAL_OINTMENT | OPHTHALMIC | Status: DC | PRN
  Administered 2020-11-20: 1 via OPHTHALMIC

## 2020-11-20 MED ORDER — LIDOCAINE-EPINEPHRINE 2 %-1:100000 IJ SOLN
INTRAMUSCULAR | Status: DC | PRN
  Administered 2020-11-20: 2 mL via OPHTHALMIC

## 2020-11-20 MED ORDER — ACETAMINOPHEN 160 MG/5ML PO SOLN: 325.0000 mg | Freq: Once | ORAL | Status: DC

## 2020-11-20 MED ORDER — DEXAMETHASONE SODIUM PHOSPHATE 4 MG/ML IJ SOLN
INTRAMUSCULAR | Status: DC | PRN
  Administered 2020-11-20: 8 mg via INTRAVENOUS

## 2020-11-20 MED ORDER — PROMETHAZINE HCL 25 MG PO TABS: ORAL_TABLET | ORAL | 0 refills | Status: DC

## 2020-11-20 SURGICAL SUPPLY — 35 items
APPLICATOR COTTON TIP WD 3 STR (MISCELLANEOUS) ×2 IMPLANT
BLADE SURG 15 STRL LF DISP TIS (BLADE) ×1 IMPLANT
BLADE SURG 15 STRL SS (BLADE) ×2
CORD BIP STRL DISP 12FT (MISCELLANEOUS) ×2 IMPLANT
GAUZE SPONGE 4X4 12PLY STRL (GAUZE/BANDAGES/DRESSINGS) ×2 IMPLANT
GLOVE SURG LX 7.0 MICRO (GLOVE) ×2
GLOVE SURG LX STRL 7.0 MICRO (GLOVE) ×2 IMPLANT
GOWN STRL REUS W/ TWL LRG LVL3 (GOWN DISPOSABLE) ×1 IMPLANT
GOWN STRL REUS W/TWL LRG LVL3 (GOWN DISPOSABLE) ×2
MARKER SKIN XFINE TIP W/RULER (MISCELLANEOUS) ×2 IMPLANT
NEEDLE FILTER BLUNT 18X 1/2SAF (NEEDLE) ×1
NEEDLE FILTER BLUNT 18X1 1/2 (NEEDLE) ×1 IMPLANT
NEEDLE HYPO 30X.5 LL (NEEDLE) ×4 IMPLANT
PACK ENT CUSTOM (PACKS) ×2 IMPLANT
SOL PREP PVP 2OZ (MISCELLANEOUS) ×2
SOLUTION PREP PVP 2OZ (MISCELLANEOUS) ×1 IMPLANT
SPONGE GAUZE 2X2 8PLY STRL LF (GAUZE/BANDAGES/DRESSINGS) ×20 IMPLANT
SUT CHROMIC 4-0 (SUTURE)
SUT CHROMIC 4-0 M2 12X2 ARM (SUTURE)
SUT CHROMIC 5 0 P 3 (SUTURE) IMPLANT
SUT ETHILON 4 0 CL P 3 (SUTURE) IMPLANT
SUT GUT PLAIN 6-0 1X18 ABS (SUTURE) ×2 IMPLANT
SUT MERSILENE 4-0 S-2 (SUTURE) IMPLANT
SUT PROLENE 5 0 P 3 (SUTURE) IMPLANT
SUT PROLENE 6 0 P 1 18 (SUTURE) ×4 IMPLANT
SUT SILK 4 0 G 3 (SUTURE) IMPLANT
SUT VIC AB 5-0 P-3 18X BRD (SUTURE) IMPLANT
SUT VIC AB 5-0 P3 18 (SUTURE)
SUT VICRYL 6-0  S14 CTD (SUTURE)
SUT VICRYL 6-0 S14 CTD (SUTURE) IMPLANT
SUT VICRYL 7 0 TG140 8 (SUTURE) IMPLANT
SUTURE CHRMC 4-0 M2 12X2 ARM (SUTURE) IMPLANT
SYR 10ML LL (SYRINGE) ×2 IMPLANT
SYR 3ML LL SCALE MARK (SYRINGE) ×2 IMPLANT
WATER STERILE IRR 250ML POUR (IV SOLUTION) ×2 IMPLANT

## 2020-11-20 NOTE — Interval H&P Note (Signed)
History and Physical Interval Note:  11/20/2020 8:32 AM  Katie [name]  has presented today for surgery, with the diagnosis of H02.831 Dermatochalasis of Right Upper Eyelid H02.834 Dermatochalasis of Left Upper Eyelid H02.403 Ptosis of Eyelid, Unspecified.  The various methods of treatment have been discussed with the patient and family. After consideration of risks, benefits and other options for treatment, the patient has consented to  Procedure(s) with comments: BLEPHAROPLASTY UPPER EYELID; W/EXCESS SKIN BLPEHAROPTOSIS REPAIR; RESECT EX BILATERAL (Bilateral) - Requests to NOT be first. as a surgical intervention.  The patient's history has been reviewed, patient examined, no change in status, stable for surgery.  I have reviewed the patient's chart and labs.  Questions were answered to the patient's satisfaction.     Vickki Muff, Amala Petion M

## 2020-11-20 NOTE — Anesthesia Procedure Notes (Signed)
Procedure Name: MAC Date/Time: 11/20/2020 9:06 AM Performed by: Jeannene Patella, CRNA Pre-anesthesia Checklist: Patient identified, Emergency Drugs available, Suction available, Patient being monitored and Timeout performed Patient Re-evaluated:Patient Re-evaluated prior to induction Oxygen Delivery Method: Nasal cannula Preoxygenation: Pre-oxygenation with 100% oxygen

## 2020-11-20 NOTE — Op Note (Signed)
Preoperative Diagnosis:  1. Visually significant blepharoptosis bilateral  Upper Eyelid(s) 2. Visually significant dermatochalasis bilateral  Upper Eyelid(s)  Postoperative Diagnosis:  Same.  Procedure(s) Performed:   1. Blepharoptosis repair with levator aponeurosis advancement bilateral  Upper Eyelid(s) 2. Upper eyelid blepharoplasty with excess skin excision  bilateral  Upper Eyelid(s)  Surgeon: Philis Pique. Vickki Muff, M.D.  Assistants: none  Anesthesia: MAC  Specimens: None.  Estimated Blood Loss: Minimal.  Complications: None.  Operative Findings: None Dictated  Procedure:   Allergies were reviewed and the patient Colchicine, Contrast media [iodinated diagnostic agents], Ciprofloxacin, Codeine sulfate, Hydrocodone bit-homatrop mbr, Hydrocodone-acetaminophen, Niacin, Shellfish allergy, Tramadol, Tape, and Thimerosal.   After the risks, benefits, complications and alternatives were discussed with the patient, appropriate informed consent was obtained.  While seated in an upright position and looking in primary gaze, the mid pupillary line was marked on the upper eyelid margins bilaterally. The patient was then brought to the operating suite and reclined supine.  Timeout was conducted and the patient was sedated.  Local anesthetic consisting of a 50-50 mixture of 2% lidocaine with epinephrine and 0.75% bupivacaine with added Hylenex was injected subcutaneously to both  upper eyelid(s). After adequate local was instilled, the patient was prepped and draped in the usual sterile fashion for eyelid surgery.   Attention was turned to the upper eyelids. A 68m upper eyelid crease incision line was marked with calipers on both  upper eyelid(s).  A pinch test was used to estimate the amount of excess skin to remove and this was marked in standard blepharoplasty style fashion. Attention was turned to the  right  upper eyelid. A #15 blade was used to open the premarked incision line. A Skin and  muscle flap was excised and hemostasis was obtained with bipolar cautery.   A buttonhole was created centrally in the orbital septum to reveal the central fat pocket. This was dissected free from fascial attachments, cauterized towards the pedicle base and excised to produce a nice flattening of the upper eyelid.  A strip of ROOF fat was excised to debulk the lateral part of the upper lid.  Westcott scissors were then used to transect through orbicularis down to the tarsal plate. Epitarsus was dissected to create a smooth surface to suture to. Dissection was then carried superiorly in the plane between orbicularis and orbital septum. Once the preaponeurotic fat pocket was identified, the orbital septum was opened. This revealed the levator and its aponeurosis.    Attention was then turned to the opposite eyelid where the same procedure was performed in the same manner. Hemostasis was obtained with bipolar cautery throughout.   3 interrupted 6-0 Prolene sutures were then passed partial thickness through the tarsal plates of both  upper eyelid(s). These sutures were placed in line with the mid pupillary, medial limbal, and lateral limbal lines. The sutures were fixed to the levator aponeurosis and adjusted until a nice lid height and contour were achieved. Once nice symmetry was achieved, the skin incisions were closed with a running 6-0 plain gut suture. The patient tolerated the procedure well.  Erythromycin ophthalmic ophthalmic ointment was applied to the incision site(s) followed by ice packs. The patient was taken to the recovery area where she recovered without difficulty.  Post-Op Plan/Instructions:   The patient was instructed to use ice packs frequently for the next 48 hours. She was instructed to use Erythromycin ophthalmic ophthalmic ointment on her incisions 4 times a day for the next 12 to 14 days. Shewas given  a prescription for tramadol (or similar) for pain control should Tylenol not be  effective. She was asked to to follow up at the [facility] in [place], [place] in 2-3 weeks' time or sooner as needed for problems.  Joanny Dupree M. Vickki Muff, M.D. Ophthalmology
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
## 2020-11-20 NOTE — Anesthesia Preprocedure Evaluation (Signed)
Anesthesia Evaluation  Patient identified by MRN, date of birth, ID band Patient awake    Reviewed: Allergy & Precautions, H&P , NPO status , Patient's Chart, lab work & pertinent test results  History of Anesthesia Complications (+) DIFFICULT AIRWAY and history of anesthetic complications  Airway Mallampati: II  TM Distance: >3 FB Neck ROM: full    Dental no notable dental hx.    Pulmonary asthma ,    Pulmonary exam normal breath sounds clear to auscultation       Cardiovascular hypertension, + Peripheral Vascular Disease  Normal cardiovascular exam Rhythm:regular Rate:Normal     Neuro/Psych CVA    GI/Hepatic GERD  ,  Endo/Other  diabetes  Renal/GU      Musculoskeletal   Abdominal   Peds  Hematology   Anesthesia Other Findings   Reproductive/Obstetrics                             Anesthesia Physical Anesthesia Plan  ASA: 3  Anesthesia Plan: MAC   Post-op Pain Management:    Induction:   PONV Risk Score and Plan: 2 and Treatment may vary due to age or medical condition, Ondansetron and TIVA  Airway Management Planned:   Additional Equipment:   Intra-op Plan:   Post-operative Plan:   Informed Consent: I have reviewed the patients History and Physical, chart, labs and discussed the procedure including the risks, benefits and alternatives for the proposed anesthesia with the patient or authorized representative who has indicated his/her understanding and acceptance.     Dental Advisory Given  Plan Discussed with: CRNA  Anesthesia Plan Comments:         Anesthesia Quick Evaluation

## 2020-11-20 NOTE — Transfer of Care (Signed)
Immediate Anesthesia Transfer of Care Note  Patient: Katie [name]  Procedure(s) Performed: BILATERAL BLEPHAROPLASTY OF UPPER EYELIDS, BILATERAL BLPEHAROPTOSIS REPAIR (Bilateral: Eye)  Patient Location: PACU  Anesthesia Type: MAC  Level of Consciousness: awake, alert  and patient cooperative  Airway and Oxygen Therapy: Patient Spontanous Breathing and Patient connected to supplemental oxygen  Post-op Assessment: Post-op Vital signs reviewed, Patient's Cardiovascular Status Stable, Respiratory Function Stable, Patent Airway and No signs of Nausea or vomiting  Post-op Vital Signs: Reviewed and stable  Complications: No notable events documented.

## 2020-11-20 NOTE — Anesthesia Postprocedure Evaluation (Signed)
Anesthesia Post Note  Patient: Katie [name]  Procedure(s) Performed: BILATERAL BLEPHAROPLASTY OF UPPER EYELIDS, BILATERAL BLPEHAROPTOSIS REPAIR (Bilateral: Eye)     Patient location during evaluation: PACU Anesthesia Type: MAC Level of consciousness: awake and alert and oriented Pain management: satisfactory to patient Vital Signs Assessment: post-procedure vital signs reviewed and stable Respiratory status: spontaneous breathing, nonlabored ventilation and respiratory function stable Cardiovascular status: blood pressure returned to baseline and stable Postop Assessment: Adequate PO intake and No signs of nausea or vomiting Anesthetic complications: no   No notable events documented.  Raliegh Ip

## 2020-12-03 DIAGNOSIS — Z08 Encounter for follow-up examination after completed treatment for malignant neoplasm: Secondary | ICD-10-CM | POA: Diagnosis not present

## 2020-12-03 DIAGNOSIS — Z170 Estrogen receptor positive status [ER+]: Secondary | ICD-10-CM | POA: Diagnosis not present

## 2020-12-03 DIAGNOSIS — Z1231 Encounter for screening mammogram for malignant neoplasm of breast: Secondary | ICD-10-CM | POA: Diagnosis not present

## 2020-12-03 DIAGNOSIS — R921 Mammographic calcification found on diagnostic imaging of breast: Secondary | ICD-10-CM | POA: Diagnosis not present

## 2020-12-03 DIAGNOSIS — Z853 Personal history of malignant neoplasm of breast: Secondary | ICD-10-CM | POA: Diagnosis not present

## 2020-12-04 ENCOUNTER — Telehealth: Payer: Self-pay | Admitting: Family Medicine

## 2020-12-04 NOTE — Telephone Encounter (Signed)
Please call pt.  Make sure she is aware re: reassuring mammogram results.   Thanks.  ===================  There are no suspicious findings in this breast.     FINDINGS: The left breast has scattered areas of fibroglandular density.  Left Calcifications: There are stable, benign appearing grouped calcifications  seen in the upper outer quadrant of the left breast in the posterior  depth, compared to magnification views from 11/03/16.

## 2020-12-04 NOTE — Telephone Encounter (Signed)
Patient notified of results.

## 2020-12-29 ENCOUNTER — Other Ambulatory Visit: Payer: Self-pay | Admitting: Family Medicine

## 2020-12-29 NOTE — Telephone Encounter (Signed)
Insurance requires SPECIFIC DIRECTIONS. PLEASE RESEND WITH MAX DAILY DOSE. I tried looking for this but didn't see directions.

## 2020-12-30 NOTE — Telephone Encounter (Signed)
Please verify with patient about how often she is using and let me know so I can update the sig.  Thanks.

## 2020-12-30 NOTE — Telephone Encounter (Signed)
Sent pt a mychart message asking how often she using the cream.

## 2021-01-06 NOTE — Telephone Encounter (Signed)
The goal is to use the least amount possible.  Please verify with patient about how often she had been using it (ie minimum for effect, which may be as little as 1-2 times per week) and let me know so I can update the sig.  Thanks

## 2021-01-08 MED ORDER — ESTRADIOL 0.1 MG/GM VA CREA: TOPICAL_CREAM | VAGINAL | 12 refills | Status: DC

## 2021-01-08 NOTE — Addendum Note (Signed)
Addended by: [name] on: 01/08/2021 11:01 AM   Modules accepted: Orders

## 2021-01-08 NOTE — Telephone Encounter (Signed)
Spoke with patient today and she stated she replied back to the [place] message with response to medication use. Nothing has come thru EMR so I'm not sure what happened there. Patient states she uses the cream 3-5 times a week. Patient has also already picked up the medication from the pharmacy.

## 2021-01-08 NOTE — Telephone Encounter (Signed)
Noted. Thanks.  I resent the rx with an updated sig.

## 2021-02-18 ENCOUNTER — Encounter: Payer: Self-pay | Admitting: Family Medicine

## 2021-02-18 ENCOUNTER — Telehealth (INDEPENDENT_AMBULATORY_CARE_PROVIDER_SITE_OTHER): Payer: Medicare HMO | Admitting: Family Medicine

## 2021-02-18 ENCOUNTER — Other Ambulatory Visit: Payer: Self-pay

## 2021-02-18 VITALS — HR 85 | Ht <= 58 in | Wt 162.0 lb

## 2021-02-18 DIAGNOSIS — E119 Type 2 diabetes mellitus without complications: Secondary | ICD-10-CM | POA: Diagnosis not present

## 2021-02-18 NOTE — Progress Notes (Signed)
Virtual visit completed through WebEx or similar program Patient location: home  Provider location: [place] at [facility], office  Participants: Patient and me (unless stated otherwise below)  Pandemic considerations d/w pt.   Limitations and rationale for visit method d/w patient.  Patient agreed to proceed.   CC: f/u.   HPI:  Diabetes:  No meds.   Hypoglycemic episodes: no sx Hyperglycemic episodes: no sx Feet problems: not now.  She has some neuropathy changes after covid but that resolved after about 6 weeks.   Blood Sugars averaging: not checked.   eye exam within last year: yes She is working on diet and exercise and monitoring exercise with her watch.   A1c to be done when possible.  I asked staff to check with patient about this.  Some dec in ankle edema w/o total resolution with 6.'25mg'$  HCTZ daily.  No ankle pain.    Flu shot to be done this fall, d/w pt.   D/w pt about covid booster.  That can be done this fall.    She is now caring for her granddaughter.  She is going to get custody of her granddaughter.  Her granddaughter needs to get set up with a medical doc.  I have checked with staff to see about getting her set up here at clinic.    She can see about see about scheduling a physical in early 2023.   Meds and allergies reviewed.   ROS: Per HPI unless specifically indicated in ROS section   NAD Speech wnl  A/P:  DM2.  We will see about getting an A1c done at a relatively convenient location and go from there.  She not on any medication currently.  She deals with multiple stressors, especially caring for her granddaughter.  She would do the best she can with diet and exercise and we will go from there.  She agrees with plan.  She will also continue hydrochlorothiazide in the meantime.

## 2021-02-21 NOTE — Assessment & Plan Note (Signed)
We will see about getting an A1c done at a relatively convenient location and go from there.  She not on any medication currently.  She deals with multiple stressors, especially caring for her granddaughter.  She would do the best she can with diet and exercise and we will go from there.  She agrees with plan.  She will also continue hydrochlorothiazide in the meantime.

## 2021-02-22 ENCOUNTER — Other Ambulatory Visit: Payer: Self-pay

## 2021-02-22 ENCOUNTER — Other Ambulatory Visit (INDEPENDENT_AMBULATORY_CARE_PROVIDER_SITE_OTHER): Payer: Medicare HMO

## 2021-02-22 DIAGNOSIS — E119 Type 2 diabetes mellitus without complications: Secondary | ICD-10-CM | POA: Diagnosis not present

## 2021-02-22 DIAGNOSIS — Z23 Encounter for immunization: Secondary | ICD-10-CM | POA: Diagnosis not present

## 2021-02-22 LAB — POCT GLYCOSYLATED HEMOGLOBIN (HGB A1C): Hemoglobin A1C: 6.1 % — AB (ref 4.0–5.6)

## 2021-05-18 DIAGNOSIS — E119 Type 2 diabetes mellitus without complications: Secondary | ICD-10-CM | POA: Diagnosis not present

## 2021-05-18 LAB — HM DIABETES EYE EXAM

## 2021-06-08 ENCOUNTER — Encounter: Payer: Self-pay | Admitting: Family Medicine

## 2021-06-08 ENCOUNTER — Telehealth: Payer: Self-pay | Admitting: Family Medicine

## 2021-06-08 NOTE — Telephone Encounter (Signed)
Pt called stating that Dr Damita Dunnings stated that he would take her granddaughter [facility]) as a new pt. Please advise.

## 2021-06-08 NOTE — Telephone Encounter (Signed)
Called and scheduled pt for 08/03/21

## 2021-06-08 NOTE — Telephone Encounter (Signed)
Yes.  Please schedule when possible.  Thanks.

## 2021-07-05 ENCOUNTER — Other Ambulatory Visit: Payer: Self-pay | Admitting: Family Medicine

## 2021-07-07 ENCOUNTER — Other Ambulatory Visit: Payer: Self-pay | Admitting: Family Medicine

## 2021-07-11 ENCOUNTER — Other Ambulatory Visit: Payer: Self-pay | Admitting: Family Medicine

## 2021-07-11 DIAGNOSIS — M109 Gout, unspecified: Secondary | ICD-10-CM

## 2021-07-11 DIAGNOSIS — E559 Vitamin D deficiency, unspecified: Secondary | ICD-10-CM

## 2021-07-11 DIAGNOSIS — E119 Type 2 diabetes mellitus without complications: Secondary | ICD-10-CM

## 2021-07-23 ENCOUNTER — Other Ambulatory Visit (INDEPENDENT_AMBULATORY_CARE_PROVIDER_SITE_OTHER): Payer: Medicare HMO

## 2021-07-23 ENCOUNTER — Other Ambulatory Visit: Payer: Self-pay

## 2021-07-23 DIAGNOSIS — M109 Gout, unspecified: Secondary | ICD-10-CM

## 2021-07-23 DIAGNOSIS — E559 Vitamin D deficiency, unspecified: Secondary | ICD-10-CM | POA: Diagnosis not present

## 2021-07-23 DIAGNOSIS — E119 Type 2 diabetes mellitus without complications: Secondary | ICD-10-CM

## 2021-07-23 LAB — LDL CHOLESTEROL, DIRECT: Direct LDL: 104 mg/dL

## 2021-07-23 LAB — COMPREHENSIVE METABOLIC PANEL
ALT: 9 U/L (ref 0–35)
AST: 16 U/L (ref 0–37)
Albumin: 4.4 g/dL (ref 3.5–5.2)
Alkaline Phosphatase: 71 U/L (ref 39–117)
BUN: 22 mg/dL (ref 6–23)
CO2: 30 mEq/L (ref 19–32)
Calcium: 9.4 mg/dL (ref 8.4–10.5)
Chloride: 101 mEq/L (ref 96–112)
Creatinine, Ser: 1.05 mg/dL (ref 0.40–1.20)
GFR: 52.32 mL/min — ABNORMAL LOW (ref >60.00)
Glucose, Bld: 130 mg/dL — ABNORMAL HIGH (ref 70–99)
Potassium: 4.3 mEq/L (ref 3.5–5.1)
Sodium: 138 mEq/L (ref 135–145)
Total Bilirubin: 0.4 mg/dL (ref 0.2–1.2)
Total Protein: 7.6 g/dL (ref 6.0–8.3)

## 2021-07-23 LAB — LIPID PANEL
Cholesterol: 214 mg/dL — ABNORMAL HIGH (ref 0–200)
HDL: 57.6 mg/dL (ref >39.00)
NonHDL: 156.73
Total CHOL/HDL Ratio: 4
Triglycerides: 359 mg/dL — ABNORMAL HIGH (ref 0.0–149.0)
VLDL: 71.8 mg/dL — ABNORMAL HIGH (ref 0.0–40.0)

## 2021-07-23 LAB — HEMOGLOBIN A1C: Hgb A1c MFr Bld: 6.8 % — ABNORMAL HIGH (ref 4.6–6.5)

## 2021-07-23 LAB — URIC ACID: Uric Acid, Serum: 8.6 mg/dL — ABNORMAL HIGH (ref 2.4–7.0)

## 2021-07-23 LAB — VITAMIN D 25 HYDROXY (VIT D DEFICIENCY, FRACTURES): VITD: 51.76 ng/mL (ref 30.00–100.00)

## 2021-07-30 ENCOUNTER — Ambulatory Visit (INDEPENDENT_AMBULATORY_CARE_PROVIDER_SITE_OTHER): Payer: Medicare HMO | Admitting: Family Medicine

## 2021-07-30 ENCOUNTER — Other Ambulatory Visit: Payer: Self-pay

## 2021-07-30 VITALS — BP 122/70 | HR 94 | Temp 98.4°F | Ht <= 58 in | Wt 164.1 lb

## 2021-07-30 DIAGNOSIS — R21 Rash and other nonspecific skin eruption: Secondary | ICD-10-CM | POA: Diagnosis not present

## 2021-07-30 DIAGNOSIS — I10 Essential (primary) hypertension: Secondary | ICD-10-CM | POA: Diagnosis not present

## 2021-07-30 DIAGNOSIS — Z79899 Other long term (current) drug therapy: Secondary | ICD-10-CM | POA: Diagnosis not present

## 2021-07-30 DIAGNOSIS — R011 Cardiac murmur, unspecified: Secondary | ICD-10-CM

## 2021-07-30 DIAGNOSIS — J45909 Unspecified asthma, uncomplicated: Secondary | ICD-10-CM

## 2021-07-30 DIAGNOSIS — E7800 Pure hypercholesterolemia, unspecified: Secondary | ICD-10-CM

## 2021-07-30 DIAGNOSIS — Z0000 Encounter for general adult medical examination without abnormal findings: Secondary | ICD-10-CM

## 2021-07-30 DIAGNOSIS — E119 Type 2 diabetes mellitus without complications: Secondary | ICD-10-CM

## 2021-07-30 DIAGNOSIS — M549 Dorsalgia, unspecified: Secondary | ICD-10-CM

## 2021-07-30 DIAGNOSIS — Z7189 Other specified counseling: Secondary | ICD-10-CM

## 2021-07-30 MED ORDER — HYDROCHLOROTHIAZIDE 12.5 MG PO TABS: 6.2500 mg | ORAL_TABLET | Freq: Every day | ORAL | 3 refills | Status: DC

## 2021-07-30 MED ORDER — ESTRADIOL 0.1 MG/GM VA CREA: TOPICAL_CREAM | VAGINAL | 12 refills | Status: DC

## 2021-07-30 MED ORDER — PREDNISONE 10 MG PO TABS: ORAL_TABLET | ORAL | 0 refills | Status: DC

## 2021-07-30 MED ORDER — LISINOPRIL 10 MG PO TABS: 10.0000 mg | ORAL_TABLET | Freq: Every day | ORAL | 3 refills | Status: DC

## 2021-07-30 NOTE — Progress Notes (Signed)
This visit occurred during the SARS-CoV-2 public health emergency.  Safety protocols were in place, including screening questions prior to the visit, additional usage of staff PPE, and extensive cleaning of exam room while observing appropriate contact time as indicated for disinfecting solutions.  I have personally reviewed the Medicare Annual Wellness questionnaire and have noted 1. The patient's medical and social history 2. Their use of alcohol, tobacco or illicit drugs 3. Their current medications and supplements 4. The patient's functional ability including ADL's, fall risks, home safety risks and hearing or visual             impairment. 5. Diet and physical activities 6. Evidence for depression or mood disorders  The patients weight, height, BMI have been recorded in the chart and visual acuity is per eye clinic.  I have made referrals, counseling and provided education to the patient based review of the above and I have provided the pt with a written personalized care plan for preventive services.  Provider list updated- see scanned forms.  Routine anticipatory guidance given to patient.  See health maintenance. The possibility exists that previously documented standard health maintenance information may have been brought forward from a previous encounter into this note.  If needed, that same information has been updated to reflect the current situation based on today's encounter.    Flu 2022 Shingles discussed with patient PNA previously done Tetanus 2016 COVID-vaccine previously done Cologuard 2022 Breast cancer screening 2022 Bone density 2019, deferred 2023. Advance directive-husband designated if patient were incapacitated.  She does not want her daughter involved in decision-making process. Cognitive function addressed- see scanned forms- and if abnormal then additional documentation follows.   In addition to [facility], follow up visit for the below conditions:  R  hand rash after using voltaren gel.  Cautions d/w pt.  She is trying to limit trigger exposure.   We talked about short course of oral steroids with routine steroid cautions.    Celebrex didn't help her back pain.  I told her I would like to consider other options re: back pain.  She would prefer not to use a narcotic.  She is going to try prednisone in the meantime as above.  She only needed flovent in the midst of a cold recently, not o/w.    Hypertension:    Using medication without problems or lightheadedness:  yes Chest pain with exertion:no Edema:no Short of breath: she had some SOB with a recent cold but is better in the meantime.   Diabetes:  No meds.   Hypoglycemic episodes: rare, cautions, d/w pt.   Hyperglycemic episodes: no sx Feet problems:no Blood Sugars averaging: not checked.   eye exam within last year: yes A1c controlled.    Elevated Cholesterol: Using medications without problems: yes Muscle aches: not likely from statin Diet compliance: yes Exercise: yes  Estrogen use d/w pt, topical.   Used prn.  No ADE on med.    PMH and SH reviewed  Meds, vitals, and allergies reviewed.   ROS: Per HPI.  Unless specifically indicated otherwise in HPI, the patient denies:  General: fever. Eyes: acute vision changes ENT: sore throat Cardiovascular: chest pain Respiratory: SOB GI: vomiting GU: dysuria Musculoskeletal: acute back pain Derm: acute rash Neuro: acute motor dysfunction Psych: worsening mood Endocrine: polydipsia Heme: bleeding Allergy: hayfever  GEN: nad, alert and oriented HEENT: ncat NECK: supple w/o LA CV: rrr. Soft SEM noted.  PULM: ctab, no inc wob ABD: soft, +bs EXT: no edema  SKIN: chronic rash on the dorsum on R hand.

## 2021-07-30 NOTE — Patient Instructions (Addendum)
Start prednisone in the meantime.  Let me know if that isn't helping.   Plan on recheck in about 4 months, A1c at the visit.   Take care.  Glad to see you.  We'll call about getting the echo set up.

## 2021-08-04 DIAGNOSIS — Z79899 Other long term (current) drug therapy: Secondary | ICD-10-CM | POA: Insufficient documentation

## 2021-08-04 DIAGNOSIS — R011 Cardiac murmur, unspecified: Secondary | ICD-10-CM | POA: Insufficient documentation

## 2021-08-04 DIAGNOSIS — R21 Rash and other nonspecific skin eruption: Secondary | ICD-10-CM | POA: Insufficient documentation

## 2021-08-04 NOTE — Assessment & Plan Note (Signed)
Reasonable continue vaginal cream, topical.  She can update me as needed. ?

## 2021-08-04 NOTE — Assessment & Plan Note (Signed)
She is going to try a short course of oral prednisone with routine steroid cautions discussed with patient she can update me as needed.  Hold NSAIDs in the meantime. ?

## 2021-08-04 NOTE — Assessment & Plan Note (Signed)
Noted on exam.  Will check echo. ?

## 2021-08-04 NOTE — Assessment & Plan Note (Signed)
Continue pravastatin.  Continue work on diet and exercise. 

## 2021-08-04 NOTE — Assessment & Plan Note (Signed)
Continue hydrochlorothiazide and lisinopril. ?

## 2021-08-04 NOTE — Assessment & Plan Note (Signed)
Celebrex didn't help her back pain.  I told her I would like to consider other options re: back pain.  She would prefer not to use a narcotic.  She is going to try prednisone in the meantime as above. ?

## 2021-08-04 NOTE — Assessment & Plan Note (Addendum)
No medications currently used.  A1c controlled.  Continue work on diet and exercise.  Recheck in about 4 months. ?

## 2021-08-04 NOTE — Assessment & Plan Note (Signed)
Flu 2022 ?Shingles discussed with patient ?PNA previously done ?Tetanus 2016 ?COVID-vaccine previously done ?Cologuard 2022 ?Breast cancer screening 2022 ?Bone density 2019, deferred 2023. ?Advance directive-husband designated if patient were incapacitated.  She does not want her daughter involved in decision-making process. ?Cognitive function addressed- see scanned forms- and if abnormal then additional documentation follows.  ?

## 2021-08-04 NOTE — Assessment & Plan Note (Signed)
She has used Flovent when she had a concurrent illness but not otherwise.  Lungs are clear. ?

## 2021-08-04 NOTE — Assessment & Plan Note (Signed)
Advance directive-husband designated if patient were incapacitated.  She does not want her daughter involved in decision-making process. ?

## 2021-08-05 ENCOUNTER — Other Ambulatory Visit: Payer: Self-pay

## 2021-08-05 ENCOUNTER — Ambulatory Visit (INDEPENDENT_AMBULATORY_CARE_PROVIDER_SITE_OTHER): Payer: Medicare HMO

## 2021-08-05 DIAGNOSIS — R011 Cardiac murmur, unspecified: Secondary | ICD-10-CM

## 2021-08-06 LAB — ECHOCARDIOGRAM COMPLETE
Area-P 1/2: 5.97 cm2
Calc EF: 56.5 %
S' Lateral: 2.3 cm
Single Plane A2C EF: 62.4 %
Single Plane A4C EF: 57 %

## 2021-10-13 ENCOUNTER — Telehealth: Payer: Self-pay | Admitting: Family Medicine

## 2021-10-13 NOTE — Telephone Encounter (Signed)
Please check with patient.  How she doing in terms of her back pain?  Please let me know.  Thanks. ?

## 2021-10-14 NOTE — Telephone Encounter (Signed)
Called patient and she states she's doing okay. Some days are better then others. She has really good days and then bad days. She has been taking the meloxicam and it keeps some. She has also been taking tylenol arthritis when the pain is really bad.  ?

## 2021-10-17 NOTE — Telephone Encounter (Signed)
Noted, thanks!

## 2021-10-25 ENCOUNTER — Other Ambulatory Visit: Payer: Self-pay | Admitting: Family Medicine

## 2021-11-29 ENCOUNTER — Encounter: Payer: Self-pay | Admitting: Family Medicine

## 2021-11-29 ENCOUNTER — Ambulatory Visit (INDEPENDENT_AMBULATORY_CARE_PROVIDER_SITE_OTHER)
Admission: RE | Admit: 2021-11-29 | Discharge: 2021-11-29 | Disposition: A | Discharge status: Home / Self Care | Payer: Medicare HMO | Source: Ambulatory Visit | Attending: Family Medicine | Admitting: Family Medicine

## 2021-11-29 ENCOUNTER — Ambulatory Visit (INDEPENDENT_AMBULATORY_CARE_PROVIDER_SITE_OTHER): Payer: Medicare HMO | Admitting: Family Medicine

## 2021-11-29 VITALS — BP 122/82 | HR 103 | Temp 97.4°F | Ht <= 58 in | Wt 166.0 lb

## 2021-11-29 DIAGNOSIS — E119 Type 2 diabetes mellitus without complications: Secondary | ICD-10-CM | POA: Diagnosis not present

## 2021-11-29 DIAGNOSIS — J45909 Unspecified asthma, uncomplicated: Secondary | ICD-10-CM

## 2021-11-29 DIAGNOSIS — M19012 Primary osteoarthritis, left shoulder: Secondary | ICD-10-CM | POA: Diagnosis not present

## 2021-11-29 DIAGNOSIS — M25519 Pain in unspecified shoulder: Secondary | ICD-10-CM | POA: Diagnosis not present

## 2021-11-29 LAB — POCT GLYCOSYLATED HEMOGLOBIN (HGB A1C): Hemoglobin A1C: 6.7 % — AB (ref 4.0–5.6)

## 2021-11-29 NOTE — Progress Notes (Signed)
D/w about her home situation.  Her granddaughter did well in school this year.  A [employer] moved into her home in the meantime- she didn't have housing o/w.  I thanked her for her effort.    Cough.  On ACE.  Longstanding.  Rare clear scant amounts of sputum.  H/o asthma.  Rare wheeze.  Rare use of SABA.  Both albuterol and flovent are expensive for patient.  I told the patient I would check with pharmacy.  She is off flovent currently.  Shoulder pain, left.  Anterior pain, then pain along the biceps.  She tried meloxicam.  Ibuprofen didn't help much.  She has pain using her sewing machine, pushing the fabric forward.  Voltaren gel helped locally.    Diabetes:  No meds.  Hypoglycemic episodes: no  Hyperglycemic episodes: no Feet problems: no Blood Sugars averaging: not checked.   eye exam within last year: yes A1c d/w pt at [place].  Controlled at 6.7.    Meds, vitals, and allergies reviewed.   ROS: Per HPI unless specifically indicated in ROS section    Nad Ncat Neck supple, no LA Rrr Ctab Abd soft not ttp Ext w/o edema.  L anterior shoulder pain with internal > ext rotation.  No arm drop.    Diabetic foot exam: Normal inspection No skin breakdown No calluses  Normal DP pulses Normal sensation to light touch and monofilament Nails normal

## 2021-12-01 DIAGNOSIS — M25519 Pain in unspecified shoulder: Secondary | ICD-10-CM | POA: Insufficient documentation

## 2021-12-01 NOTE — Assessment & Plan Note (Signed)
Concern for rotator cuff pathology.  See notes on imaging.  We can refer to orthopedics if needed.

## 2021-12-01 NOTE — Assessment & Plan Note (Signed)
,   Check with pharmacy here to see if she can get help with paying for Flovent and albuterol.

## 2021-12-01 NOTE — Assessment & Plan Note (Signed)
No change in meds.  A1c controlled.  Continue work on diet and exercise.  Recheck later this fall.  See after visit summary.  She agrees with plan.

## 2021-12-06 ENCOUNTER — Telehealth: Payer: Self-pay | Admitting: Family Medicine

## 2021-12-06 NOTE — Chronic Care Management (AMB) (Signed)
  Chronic Care Management   Outreach Note  12/06/2021 Name: Katie [name] MRN: [id number] DOB: 12/15/1945  Referred by: [name], MD Reason for referral : No chief complaint on file.   An unsuccessful telephone outreach was attempted today. The patient was referred to the pharmacist for assistance with care management and care coordination.   Follow Up Plan:   Tatjana Dellinger Upstream Scheduler

## 2021-12-06 NOTE — Progress Notes (Signed)
  Chronic Care Management   Note  12/06/2021 Name: Katie [name] MRN: [id number] DOB: [birth year]  Katie [name] is a 76 y.o. year old female who is a primary care patient of [name], MD. I reached out to Alexis Frock by phone today in response to a referral sent by Katie [name]'s PCP, [name], MD.   Katie [name] was given information about Chronic Care Management services today including:  CCM service includes personalized support from designated clinical staff supervised by her physician, including individualized plan of care and coordination with other care providers 24/7 contact phone numbers for assistance for urgent and routine care needs. Service will only be billed when office clinical staff spend 20 minutes or more in a month to coordinate care. Only one practitioner may furnish and bill the service in a calendar month. The patient may stop CCM services at any time (effective at the end of the month) by phone call to the office staff.   Patient agreed to services and verbal consent obtained.   Follow up plan:REFERRAL/ NO COPAY   Tatjana Dellinger Upstream Scheduler

## 2021-12-21 ENCOUNTER — Telehealth: Payer: Self-pay

## 2021-12-21 NOTE — Progress Notes (Signed)
    Chronic Care Management Pharmacy Assistant   Name: Katie [name]  MRN: [id number] DOB: 1945/10/02  Reason for Encounter: CCM (Flovent PAP 2023)   Flovent patient assistant forms with [facility] have been completed and uploaded. Patient will be bring proof of income to appointment with [name] on 12/24/2021.  [name], CPP notified  [name], [place] Clinical Pharmacy Assistant [phone]

## 2021-12-21 NOTE — Progress Notes (Signed)
Chronic Care Management Pharmacy Assistant   Name: MARJARIE IRION  MRN: [id number] DOB: 07-23-1945  Reason for Encounter: CCM (Initial Questions)  Recent office visits:  11/29/21 Elsie Stain, MD DM A1C 6.7 Ordered: DG Shoulder Left Referral to Orthopedic Surgery Stop (completed) Prednisone 10 mg FU 4 months 07/30/21 AWV  Recent consult visits:  08/05/21 Ida Rogue (Cardiology) Cardiac murmur No other information  Hospital visits:  None in previous 6 months  Medications: Outpatient Encounter Medications as of 12/21/2021  Medication Sig   albuterol (VENTOLIN HFA) 108 (90 Base) MCG/ACT inhaler Inhale 2 puffs into the lungs every 6 (six) hours as needed for wheezing or shortness of breath. Hold for patient to request.   aspirin 325 MG EC tablet Take 325 mg by mouth daily. Reported on 12/01/2015   benzonatate (TESSALON) 200 MG capsule Take 1 capsule (200 mg total) by mouth 3 (three) times daily as needed. Swallow whole, do not bite the pill   Cholecalciferol (VITAMIN D3) 125 MCG (5000 UT) CAPS Take 1 capsule by mouth daily.   estradiol (ESTRACE) 0.1 MG/GM vaginal cream Use a small amount daily as needed, typically 3-5 times per week, 1 tube/month.  Do not dispense med from [facility]   fluticasone (FLONASE) 50 MCG/ACT nasal spray 2 sprays in each nostril once a day   fluticasone (FLOVENT HFA) 110 MCG/ACT inhaler Inhale 2 puffs into the lungs 2 (two) times daily. Rinse after use.   hydrochlorothiazide (HYDRODIURIL) 12.5 MG tablet Take 0.5 tablets (6.25 mg total) by mouth daily.   lisinopril (ZESTRIL) 10 MG tablet Take 1 tablet (10 mg total) by mouth daily.   meloxicam (MOBIC) 7.5 MG tablet Take 1 tablet by mouth twice daily with food   Multiple Vitamin (MULTIVITAMIN) tablet Take 1 tablet by mouth daily.   nystatin cream (MYCOSTATIN) Apply 1 application topically 2 (two) times daily.   omeprazole (PRILOSEC OTC) 20 MG tablet Take 1 tablet (20 mg total) by mouth daily.   pravastatin  (PRAVACHOL) 80 MG tablet Take 1 tablet by mouth once daily   Probiotic Product ([place]) CAPS Take by mouth daily. Reported on 12/01/2015   No facility-administered encounter medications on file as of 12/21/2021.    Lab Results  Component Value Date/Time   HGBA1C 6.7 (A) 11/29/2021 10:12 AM   HGBA1C 6.8 (H) 07/23/2021 08:34 AM   HGBA1C 6.1 (A) 02/22/2021 11:16 AM   HGBA1C 6.6 (H) 06/11/2020 08:16 AM   MICROALBUR 1.2 12/17/2010 08:51 AM   MICROALBUR 1.5 08/17/2009 09:07 AM     BP Readings from Last 3 Encounters:  11/29/21 122/82  07/30/21 122/70  11/20/20 100/73    Patient contacted to confirm in office appointment with [name], Pharm D, on 12/24/2021 at 9:30.  Do you have any problems getting your medications? Yes Patient states both Albuterol and Flovent are expensive. Patient is currently not taking Flovent according to office notes from 11/29/2021. Flovent patient assistant form has been completed and uploaded for the visit. Spoke with patient and she is going to bring proof of income to the appointment. Patient stated Flovent costs her $189 a month.  What is your top health concern you would like to discuss at your upcoming visit? Patient has a cough that she would like to discuss. She has an app that tracks her cough. Patient is going to bring a print a out of a graph that details her cough.   Have you seen any other providers since your last visit with  PCP? No  CCM referral has been placed prior to visit?  Yes   Star Rating Drugs:  Medication:  Last Fill: Day Supply Lisinopril 10 mg 12/14/21 90 Pravastatin 80 mg 09/30/21 90  Care Gaps: Annual wellness visit in last year? Yes 07/30/21 Most Recent BP reading: 122/82 on 11/29/21  If Diabetic: Most recent A1C reading: 6.7 on 11/29/21 Last eye exam / retinopathy screening: Up to date Last diabetic foot exam: Up to date  [name], CPP notified  [name], [place]  Assistant [phone]

## 2021-12-24 ENCOUNTER — Encounter: Payer: Self-pay | Admitting: Family Medicine

## 2021-12-24 ENCOUNTER — Ambulatory Visit (INDEPENDENT_AMBULATORY_CARE_PROVIDER_SITE_OTHER): Payer: Medicare HMO | Admitting: Pharmacist

## 2021-12-24 ENCOUNTER — Telehealth: Payer: Self-pay | Admitting: Pharmacist

## 2021-12-24 DIAGNOSIS — J45909 Unspecified asthma, uncomplicated: Secondary | ICD-10-CM

## 2021-12-24 DIAGNOSIS — I10 Essential (primary) hypertension: Secondary | ICD-10-CM

## 2021-12-24 DIAGNOSIS — E119 Type 2 diabetes mellitus without complications: Secondary | ICD-10-CM

## 2021-12-24 DIAGNOSIS — E7800 Pure hypercholesterolemia, unspecified: Secondary | ICD-10-CM

## 2021-12-24 MED ORDER — BUDESONIDE-FORMOTEROL FUMARATE 80-4.5 MCG/ACT IN AERO: 2.0000 | INHALATION_SPRAY | Freq: Two times a day (BID) | RESPIRATORY_TRACT | 3 refills | Status: DC

## 2021-12-24 NOTE — Telephone Encounter (Signed)
Agree.  Thanks.  Rx sent.
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
## 2021-12-24 NOTE — Telephone Encounter (Signed)
Patient cannot afford Flovent inhaler. She will likely benefit from ICS/LABA inhaler given asthma/ARDS history as she has responded well to ICS and SABA in the past.  Pt qualifies for [employer] PAP and will be able to get Symbicort for free.  Recommend to switch Flovent to Symbicort 80-4.5 mcg/act. She can use Symbicort as a rescue inhaler PRN as well.  Routing to PCP for approval.

## 2021-12-27 NOTE — Telephone Encounter (Signed)
Cancelled PAP as patient will be switching to Symbicort.
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
## 2022-01-03 DIAGNOSIS — E7800 Pure hypercholesterolemia, unspecified: Secondary | ICD-10-CM | POA: Diagnosis not present

## 2022-01-03 DIAGNOSIS — J45909 Unspecified asthma, uncomplicated: Secondary | ICD-10-CM | POA: Diagnosis not present

## 2022-01-03 DIAGNOSIS — I10 Essential (primary) hypertension: Secondary | ICD-10-CM | POA: Diagnosis not present

## 2022-01-03 DIAGNOSIS — E119 Type 2 diabetes mellitus without complications: Secondary | ICD-10-CM

## 2022-01-11 NOTE — Telephone Encounter (Signed)
Dr Damita Dunnings,   Please see mychart message to patient.   This referral was handled on 11/30/2021 - all notes faxed over to [facility].  Mychart message/letter was sent to the patient same day letting her know where to call to schedule.   Pt aware she needs to call to schedule her appt.

## 2022-01-11 NOTE — Telephone Encounter (Signed)
Noted. Thanks.

## 2022-01-17 ENCOUNTER — Telehealth: Payer: Self-pay

## 2022-01-17 DIAGNOSIS — J45909 Unspecified asthma, uncomplicated: Secondary | ICD-10-CM

## 2022-01-17 NOTE — Progress Notes (Signed)
Called [employer] to check on shipment of patient's Symbicort.  [facility] stated they do not have a prescription for the patient. Please send the RX to [facility] or you can also fax it to [phone] with Patient ID: [id number].  Katie [name], CPP notified  Katie [name], [place] Clinical Pharmacy Assistant [phone]

## 2022-01-17 NOTE — Telephone Encounter (Signed)
Patient states that she and Mendel Ryder were coordinating about getting budesonide-formoterol (SYMBICORT) 80-4.5 MCG/ACT inhaler covered but it has been more than 30 days and she hasnt heard from anyone and is wondering what they can do.

## 2022-01-17 NOTE — Telephone Encounter (Signed)
Symbicort PAP ([employer]Me) was approved 12/24/21. Rx sent same day to [facility]. Will follow up with [facility] regarding shipment.

## 2022-01-18 MED ORDER — BUDESONIDE-FORMOTEROL FUMARATE 80-4.5 MCG/ACT IN AERO: 2.0000 | INHALATION_SPRAY | Freq: Two times a day (BID) | RESPIRATORY_TRACT | 3 refills | Status: DC

## 2022-01-18 NOTE — Progress Notes (Signed)
According to [facility] Me that have not yet received the prescription. Representative stated it can take up to 72 hours to show on the patients account.   [name], CPP notified  [name], [place] Clinical Pharmacy Assistant [phone]

## 2022-01-18 NOTE — Addendum Note (Signed)
Addended by: Charlton Haws on: 01/18/2022 10:47 AM   Modules accepted: Orders

## 2022-01-19 ENCOUNTER — Telehealth: Payer: Self-pay

## 2022-01-19 NOTE — Progress Notes (Signed)
Called [name] to verify they received the prescription that [name] e-scribed to [facility] on 07/21 and on 01/18/22. I spoke with [facility] and they have received the prescriptions (both). [facility] pharmacist is going to have Katie [name] update their system as Katie [name] stated they do not have either RX. Pharmacist stated to give their system 1 business day to update. I will call tomorrow morning (01/20/22) to verify it is processing.   [name], CPP notified  [name], [place] Clinical Pharmacy Assistant [phone]

## 2022-01-19 NOTE — Progress Notes (Signed)
Entered in Error

## 2022-01-21 NOTE — Progress Notes (Signed)
Called [facility] to inquire on status of patient's Symbicort. [facility] states they received RX yesterday (01/20/22) from [facility]. It is in processing and will be shipped within 10-14 business days from yesterday. I called patient to inform her.   [name], CPP notified  [name], [place] Clinical Pharmacy Assistant [phone]

## 2022-02-03 DIAGNOSIS — C50911 Malignant neoplasm of unspecified site of right female breast: Secondary | ICD-10-CM | POA: Diagnosis not present

## 2022-02-03 DIAGNOSIS — Z170 Estrogen receptor positive status [ER+]: Secondary | ICD-10-CM | POA: Diagnosis not present

## 2022-02-03 DIAGNOSIS — Z9011 Acquired absence of right breast and nipple: Secondary | ICD-10-CM | POA: Diagnosis not present

## 2022-02-03 DIAGNOSIS — Z1231 Encounter for screening mammogram for malignant neoplasm of breast: Secondary | ICD-10-CM | POA: Diagnosis not present

## 2022-02-03 DIAGNOSIS — Z853 Personal history of malignant neoplasm of breast: Secondary | ICD-10-CM | POA: Diagnosis not present

## 2022-02-06 ENCOUNTER — Other Ambulatory Visit: Payer: Self-pay | Admitting: Family Medicine

## 2022-02-07 ENCOUNTER — Other Ambulatory Visit: Payer: Self-pay | Admitting: Family Medicine

## 2022-02-10 ENCOUNTER — Telehealth: Payer: Self-pay

## 2022-02-10 NOTE — Progress Notes (Signed)
    Chronic Care Management Pharmacy Assistant   Name: Katie [name]  MRN: [id number] DOB: 12-Jul-1945  Reason for Encounter: CCM (Appointment Reminder)  Medications: Outpatient Encounter Medications as of 02/10/2022  Medication Sig   aspirin 325 MG EC tablet Take 325 mg by mouth daily. Reported on 12/01/2015   benzonatate (TESSALON) 200 MG capsule Take 1 capsule (200 mg total) by mouth 3 (three) times daily as needed. Swallow whole, do not bite the pill   budesonide-formoterol (SYMBICORT) 80-4.5 MCG/ACT inhaler Inhale 2 puffs into the lungs 2 (two) times daily. May use as needed for rescue as well. Rinse after use.   Cholecalciferol (VITAMIN D3) 125 MCG (5000 UT) CAPS Take 1 capsule by mouth daily.   estradiol (ESTRACE) 0.1 MG/GM vaginal cream Use a small amount daily as needed, typically 3-5 times per week, 1 tube/month.  Do not dispense med from [facility]   fluticasone (FLONASE) 50 MCG/ACT nasal spray 2 sprays in each nostril once a day   hydrochlorothiazide (HYDRODIURIL) 12.5 MG tablet Take 0.5 tablets (6.25 mg total) by mouth daily.   lisinopril (ZESTRIL) 10 MG tablet Take 1 tablet (10 mg total) by mouth daily.   meloxicam (MOBIC) 7.5 MG tablet Take 1 tablet by mouth twice daily with food   Multiple Vitamin (MULTIVITAMIN) tablet Take 1 tablet by mouth daily.   nystatin cream (MYCOSTATIN) Apply 1 application topically 2 (two) times daily.   omeprazole (PRILOSEC OTC) 20 MG tablet Take 1 tablet (20 mg total) by mouth daily.   pravastatin (PRAVACHOL) 80 MG tablet Take 1 tablet by mouth once daily   Probiotic Product ([place]) CAPS Take by mouth daily. Reported on 12/01/2015   No facility-administered encounter medications on file as of 02/10/2022.   Katie [name] was contacted to remind of upcoming telephone visit with [name] on 02/15/2022 at 3:00. Patient was reminded to have any blood glucose and blood pressure readings available for review at appointment.   Patient  confirmed appointment.  Are you having any problems with your medications? No   Do you have any concerns you like to discuss with the pharmacist? No  CCM referral has been placed prior to visit?  Yes   Summary: CCM Initial visit -Reviewed medications; pt affirms compliance -Pt has not been able to afford Flovent; she reports worsening cough lately and wants to start a daily inhaler. Of note patient is also taking lisinopril which can exacerbate cough, but pt reports cough pre-dates lisinopril by a number of years and she would rather not make multiple changes   Recommendations/Changes made from today's visit: -Start Symbicort 80-4.5 mcg/act 2 puff BID through [facility] -see phone note -Can consider switching lisinopril to ARB in future   Plan: -[place] will call patient 1 month re: Symbicort  -Pharmacist follow up televisit scheduled for 2 months -PCP f/u 03/17/22  12/24/21 - Recommend to switch Flovent to Symbicort 80-4.5 mcg/act. She can use Symbicort as a rescue inhaler PRN as well.  Star Rating Drugs: Medication:  Last Fill: Day Supply Lisinopril 10 mg 12/14/2021 90 Pravastatin 80 mg 12/30/21 [place], CPP notified  [name], [place] Pharmacy Assistant [phone]

## 2022-02-15 ENCOUNTER — Ambulatory Visit (INDEPENDENT_AMBULATORY_CARE_PROVIDER_SITE_OTHER): Payer: Medicare HMO | Admitting: Pharmacist

## 2022-02-15 DIAGNOSIS — E7800 Pure hypercholesterolemia, unspecified: Secondary | ICD-10-CM

## 2022-02-15 DIAGNOSIS — I10 Essential (primary) hypertension: Secondary | ICD-10-CM

## 2022-02-15 DIAGNOSIS — J45909 Unspecified asthma, uncomplicated: Secondary | ICD-10-CM

## 2022-02-15 DIAGNOSIS — E119 Type 2 diabetes mellitus without complications: Secondary | ICD-10-CM
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
## 2022-03-05 DIAGNOSIS — J45909 Unspecified asthma, uncomplicated: Secondary | ICD-10-CM | POA: Diagnosis not present

## 2022-03-05 DIAGNOSIS — I10 Essential (primary) hypertension: Secondary | ICD-10-CM

## 2022-03-05 DIAGNOSIS — E1159 Type 2 diabetes mellitus with other circulatory complications: Secondary | ICD-10-CM | POA: Diagnosis not present

## 2022-03-05 DIAGNOSIS — E785 Hyperlipidemia, unspecified: Secondary | ICD-10-CM

## 2022-03-07 ENCOUNTER — Telehealth: Payer: Self-pay | Admitting: Family Medicine

## 2022-03-07 NOTE — Telephone Encounter (Signed)
She has cramping at rest but not with exertion, in the feet.  D/w pt about hold statin for about 10 days.  She has f/u pending.

## 2022-03-17 ENCOUNTER — Encounter: Payer: Self-pay | Admitting: Family Medicine

## 2022-03-17 ENCOUNTER — Ambulatory Visit (INDEPENDENT_AMBULATORY_CARE_PROVIDER_SITE_OTHER): Payer: Medicare HMO | Admitting: Family Medicine

## 2022-03-17 VITALS — BP 120/78 | HR 98 | Temp 97.3°F | Ht <= 58 in | Wt 162.0 lb

## 2022-03-17 DIAGNOSIS — E119 Type 2 diabetes mellitus without complications: Secondary | ICD-10-CM

## 2022-03-17 DIAGNOSIS — M2550 Pain in unspecified joint: Secondary | ICD-10-CM

## 2022-03-17 LAB — POCT GLYCOSYLATED HEMOGLOBIN (HGB A1C): Hemoglobin A1C: 6.5 % — AB (ref 4.0–5.6)

## 2022-03-17 MED ORDER — PREDNISONE 10 MG PO TABS: ORAL_TABLET | ORAL | 0 refills | Status: DC

## 2022-03-17 NOTE — Progress Notes (Signed)
Diabetes:  No meds.   Hypoglycemic episodes: no Hyperglycemic episodes: no Feet problems: no tingling or numbness.  She had tingling after episode of covid, it got better thereafter with some episodic sx (none currently). Blood Sugars averaging: not checked.    eye exam within last year: yes A1c 6.5.  improved from prior.    Recently with long drive from [place]. She stopped pravastatin but still had cramping in the feet, d/w pt about restart.  She changed from meloxicam to ibuprofen recently. Tylenol prn in the middle of the day.she had R knee pain, R wrist pain, R foot pain.  She had R achilles pain then lateral midfoot pain.  She has been sleeping in a R wrist brace.    Her L shoulder is some better in the meantime.  Referral was faxed but she wasn't contacted by ortho about that.  She'll update me as needed.    Meds, vitals, and allergies reviewed.   ROS: Per HPI unless specifically indicated in ROS section   GEN: nad, alert and oriented HEENT: ncat NECK: supple w/o LA CV: rrr. PULM: ctab, no inc wob ABD: soft, +bs EXT: no edema SKIN: well perfused.    Diabetic foot exam: Normal inspection No skin breakdown No calluses  Normal DP pulses Normal sensation to light touch and monofilament Nails normal

## 2022-03-17 NOTE — Patient Instructions (Addendum)
Use prednisone and let me know if that isn't helping.  Recheck in about 6 months at yearly visit.   Take care.  Glad to see you.

## 2022-03-20 DIAGNOSIS — M2550 Pain in unspecified joint: Secondary | ICD-10-CM | POA: Insufficient documentation

## 2022-03-20 NOTE — Assessment & Plan Note (Signed)
A1c 6.5.  improved from prior.  Continue work on diet and exercise as tolerated.  Recheck periodically.

## 2022-03-20 NOTE — Assessment & Plan Note (Addendum)
She is having diffuse aches and we talked about options at this point.  It would be reasonable to try a short course of prednisone routine steroid cautions discussed with patient she will update me as needed.  See above.

## 2022-04-18 ENCOUNTER — Telehealth: Payer: Self-pay

## 2022-04-18 NOTE — Telephone Encounter (Signed)
Noted. Will see tomorrow.  Thanks.

## 2022-04-18 NOTE — Telephone Encounter (Addendum)
[facility] - Client Nonclinical Telephone Record  [facility] Client [place] [facility] - Client Client Site [facility] - Night Provider Renford Dills - MD Contact Type Call Who Is Calling Patient / Member / Family / Caregiver Caller Name Adyn Hoes [place] Phone Number [phone] Patient Name Katie [name] Patient DOB [birth year] Call Type Message Only Information Provided Reason for Call Request to Schedule Office Appointment Initial Comment Caller states, she has been having bad allergies, coughing. She would like to be seen today. Declined triage. Patient request to speak to RN No Disp. Time Disposition Final User 04/18/2022 8:06:16 AM General Information Provided Yes Jobie Quaker Call Closed By: Jobie Quaker Transaction Date/Time: 04/18/2022 8:03:57 AM (ET   Per appt notes appt has already been scheduled with Dr Damita Dunnings on 04/19/22.sending note to Dr Damita Dunnings.

## 2022-04-19 ENCOUNTER — Ambulatory Visit (INDEPENDENT_AMBULATORY_CARE_PROVIDER_SITE_OTHER): Payer: Medicare HMO | Admitting: Family Medicine

## 2022-04-19 ENCOUNTER — Encounter: Payer: Self-pay | Admitting: Family Medicine

## 2022-04-19 VITALS — BP 118/60 | HR 107 | Temp 97.1°F | Ht <= 58 in | Wt 164.0 lb

## 2022-04-19 DIAGNOSIS — J0100 Acute maxillary sinusitis, unspecified: Secondary | ICD-10-CM

## 2022-04-19 MED ORDER — PREDNISONE 10 MG PO TABS: 10.0000 mg | ORAL_TABLET | Freq: Every day | ORAL | 0 refills | Status: DC

## 2022-04-19 MED ORDER — AMOXICILLIN-POT CLAVULANATE 875-125 MG PO TABS: 1.0000 | ORAL_TABLET | Freq: Two times a day (BID) | ORAL | 0 refills | Status: AC

## 2022-04-19 MED ORDER — BENZONATATE 200 MG PO CAPS: 200.0000 mg | ORAL_CAPSULE | Freq: Three times a day (TID) | ORAL | 1 refills | Status: DC | PRN

## 2022-04-19 NOTE — Progress Notes (Signed)
Patient has had stuffy head, productive cough, chills, sweats and a lot of thich drainage x 3 weeks. Patient states when it stated she could hear some wheezing. Has been taking mucinex and robitussin.  Sx worse in the last week or so.  Recently with chills. Not sleeping well, from congestion/cough.  She isn't lightheaded.  Maxillary pain B.    Meds, vitals, and allergies reviewed.   ROS: Per HPI unless specifically indicated in ROS section   GEN: nad, alert and oriented HEENT: mucous membranes moist, tm w/o erythema, nasal exam w/o erythema, clear discharge noted,  OP with cobblestoning, max sinuses ttp B NECK: supple w/o LA CV: rrr.   PULM: ctab, no inc wob EXT: no edema SKIN: well perfused.

## 2022-04-19 NOTE — Patient Instructions (Signed)
Rest and fluids, start augmentin.  If needed, then start prednisone with food.  Update [place] as needed.  Take care.  Glad to see you.

## 2022-04-19 NOTE — Assessment & Plan Note (Signed)
Okay for outpatient f/u.  Nontoxic. Rest and fluids, start augmentin.  If needed, then start prednisone with food. Steroid cautions d/w pt.  Update [place] as needed.  She agrees with plan.

## 2022-04-22 ENCOUNTER — Other Ambulatory Visit: Payer: Self-pay | Admitting: Family Medicine

## 2022-05-02 ENCOUNTER — Ambulatory Visit (INDEPENDENT_AMBULATORY_CARE_PROVIDER_SITE_OTHER): Payer: Medicare HMO | Admitting: Family Medicine

## 2022-05-02 ENCOUNTER — Ambulatory Visit (INDEPENDENT_AMBULATORY_CARE_PROVIDER_SITE_OTHER)
Admission: RE | Admit: 2022-05-02 | Discharge: 2022-05-02 | Disposition: A | Discharge status: Home / Self Care | Payer: Medicare HMO | Source: Ambulatory Visit | Attending: Family Medicine | Admitting: Family Medicine

## 2022-05-02 VITALS — BP 118/62 | HR 130 | Temp 98.0°F | Ht <= 58 in | Wt 162.0 lb

## 2022-05-02 DIAGNOSIS — R059 Cough, unspecified: Secondary | ICD-10-CM

## 2022-05-02 DIAGNOSIS — J189 Pneumonia, unspecified organism: Secondary | ICD-10-CM | POA: Diagnosis not present

## 2022-05-02 DIAGNOSIS — J45909 Unspecified asthma, uncomplicated: Secondary | ICD-10-CM | POA: Diagnosis not present

## 2022-05-02 LAB — CBC WITH DIFFERENTIAL/PLATELET
Basophils Absolute: 0.1 K/uL (ref 0.0–0.1)
Basophils Relative: 0.8 % (ref 0.0–3.0)
Eosinophils Absolute: 0.3 K/uL (ref 0.0–0.7)
Eosinophils Relative: 2.2 % (ref 0.0–5.0)
HCT: 40.9 % (ref 36.0–46.0)
Hemoglobin: 13.5 g/dL (ref 12.0–15.0)
Lymphocytes Relative: 7.2 % — ABNORMAL LOW (ref 12.0–46.0)
Lymphs Abs: 0.8 K/uL (ref 0.7–4.0)
MCHC: 32.9 g/dL (ref 30.0–36.0)
MCV: 90.8 fl (ref 78.0–100.0)
Monocytes Absolute: 1.1 K/uL — ABNORMAL HIGH (ref 0.1–1.0)
Monocytes Relative: 9.9 % (ref 3.0–12.0)
Neutro Abs: 9.2 K/uL — ABNORMAL HIGH (ref 1.4–7.7)
Neutrophils Relative %: 79.9 % — ABNORMAL HIGH (ref 43.0–77.0)
Platelets: 331 K/uL (ref 150.0–400.0)
RBC: 4.51 Mil/uL (ref 3.87–5.11)
RDW: 15.2 % (ref 11.5–15.5)
WBC: 11.5 K/uL — ABNORMAL HIGH (ref 4.0–10.5)

## 2022-05-02 LAB — COMPREHENSIVE METABOLIC PANEL
ALT: 18 U/L (ref 0–35)
AST: 23 U/L (ref 0–37)
Albumin: 4.8 g/dL (ref 3.5–5.2)
Alkaline Phosphatase: 79 U/L (ref 39–117)
BUN: 26 mg/dL — ABNORMAL HIGH (ref 6–23)
CO2: 23 mEq/L (ref 19–32)
Calcium: 9.9 mg/dL (ref 8.4–10.5)
Chloride: 99 mEq/L (ref 96–112)
Creatinine, Ser: 1.14 mg/dL (ref 0.40–1.20)
GFR: 47.15 mL/min — ABNORMAL LOW (ref >60.00)
Glucose, Bld: 153 mg/dL — ABNORMAL HIGH (ref 70–99)
Potassium: 4.5 mEq/L (ref 3.5–5.1)
Sodium: 131 mEq/L — ABNORMAL LOW (ref 135–145)
Total Bilirubin: 0.4 mg/dL (ref 0.2–1.2)
Total Protein: 8.4 g/dL — ABNORMAL HIGH (ref 6.0–8.3)

## 2022-05-02 MED ORDER — ALBUTEROL SULFATE HFA 108 (90 BASE) MCG/ACT IN AERS: 2.0000 | INHALATION_SPRAY | Freq: Four times a day (QID) | RESPIRATORY_TRACT | 1 refills | Status: AC | PRN

## 2022-05-02 MED ORDER — DOXYCYCLINE HYCLATE 100 MG PO TABS: 100.0000 mg | ORAL_TABLET | Freq: Two times a day (BID) | ORAL | 0 refills | Status: DC

## 2022-05-02 MED ORDER — CEFTRIAXONE SODIUM 1 G IJ SOLR
1.0000 g | Freq: Once | INTRAMUSCULAR | Status: AC
  Administered 2022-05-02: 1 g via INTRAMUSCULAR

## 2022-05-02 NOTE — Patient Instructions (Signed)
Start doxy.  Dose of rocephin here at clinic.  Go to the lab on the way out.   If you have mychart we'll likely use that to update you.    Rest and fluids.  If not improving in the near future or if any worse in the meantime, then diall 911 or go to the ER.  Take care.  Glad to see you.

## 2022-05-02 NOTE — Progress Notes (Unsigned)
duration of symptoms: now 5 weeks into sx.   rhinorrhea: yes congestion: yes Facial pain, max and frontal area.   ear pain: no sore throat: no cough: yes myalgias: not more than typical baseline.   Not SOB walking into clinic.    Tachycardia noted. She had episodic pulse elevations noted on her watch, with NSR noted and w/o IRR.  Per HPI unless specifically indicated in ROS section  Meds, vitals, and allergies reviewed.   GEN: nad, alert and oriented HEENT: mucous membranes moist, TM w/o erythema, nasal epithelium injected, OP with cobblestoning NECK: supple w/o LA CV: rrr. PULM: ctab except for scattered rhonchi on the LLL ABD: soft, +bs EXT: no edema  She is [facility].    30 minutes were devoted to patient care in this encounter (this includes time spent reviewing the patient's file/history, interviewing and examining the patient, counseling/reviewing plan with patient).

## 2022-05-03 ENCOUNTER — Telehealth: Payer: Self-pay | Admitting: Family Medicine

## 2022-05-03 NOTE — Telephone Encounter (Signed)
Spoke with patient about results.

## 2022-05-03 NOTE — Telephone Encounter (Signed)
Pt called in to make Dr Damita Dunnings aware she's  feeling better  oxygen is 94 and heart rate 99-110

## 2022-05-03 NOTE — Telephone Encounter (Signed)
Thanks. Would continue as is.  Please see lab and cxr report. Update [place] as needed.

## 2022-05-04 NOTE — Assessment & Plan Note (Signed)
Failed previous antibiotic treatment.  Discussed options.  Tachycardia noted but at this point still appears okay for outpatient follow-up.  Check chest x-ray and labs. Start doxy.  Dose of rocephin here at clinic.    Rest and fluids.  If not improving in the near future or if any worse in the meantime, then diall 911 or go to the ER.  She agrees with plan.  See follow-up notes.

## 2022-05-09 DIAGNOSIS — M13849 Other specified arthritis, unspecified hand: Secondary | ICD-10-CM | POA: Diagnosis not present

## 2022-05-09 DIAGNOSIS — G5602 Carpal tunnel syndrome, left upper limb: Secondary | ICD-10-CM | POA: Diagnosis not present

## 2022-05-09 DIAGNOSIS — M79641 Pain in right hand: Secondary | ICD-10-CM | POA: Diagnosis not present

## 2022-05-09 DIAGNOSIS — G5603 Carpal tunnel syndrome, bilateral upper limbs: Secondary | ICD-10-CM | POA: Diagnosis not present

## 2022-05-15 ENCOUNTER — Encounter: Payer: Self-pay | Admitting: Family Medicine

## 2022-05-17 ENCOUNTER — Telehealth: Payer: Self-pay | Admitting: Family Medicine

## 2022-05-17 NOTE — Telephone Encounter (Signed)
Please check on patient.   Is her sputum discolored? Is it gradually getting better? Does albuterol help the cough?   Any new symptoms?    Please let me know.  Thanks.

## 2022-05-17 NOTE — Telephone Encounter (Signed)
Spoke with patient. She has no discolored sputum. States this is the best day she has felt okay but still very drained. Albuterol helps with the cough when she needs it. Has been taking zyrtec, Symbicort and nasal spray. Head is clear but has runny nose. Patient states she has not had any new sx.

## 2022-05-18 NOTE — Telephone Encounter (Signed)
Given all of that, I would give it a little longer with prn albuterol.  I would still expect gradual improvement without starting extra meds at this point.  If worsening, then please let me know.  Thanks.

## 2022-05-18 NOTE — Telephone Encounter (Signed)
Spoke with patient and advised about below.

## 2022-05-24 DIAGNOSIS — E119 Type 2 diabetes mellitus without complications: Secondary | ICD-10-CM | POA: Diagnosis not present

## 2022-05-26 ENCOUNTER — Other Ambulatory Visit: Payer: Self-pay | Admitting: Family Medicine

## 2022-06-07 ENCOUNTER — Telehealth: Payer: Self-pay

## 2022-06-07 NOTE — Telephone Encounter (Signed)
Patient requests refill of Meloxicam 7.5 mg. This is not currently on her medication last but has been prescribed by PCP in the past.  Last eRx 10/25/21 #180 with 0 RF. Next OV 09/16/22  [id number] [place] [id number]: [phone]: [phone]

## 2022-06-07 NOTE — Progress Notes (Addendum)
Reason for Encounter: Appointment Reminder  Contacted patient on 06/07/2022   Medications: Outpatient Encounter Medications as of 06/07/2022  Medication Sig   albuterol (VENTOLIN HFA) 108 (90 Base) MCG/ACT inhaler Inhale 2 puffs into the lungs every 6 (six) hours as needed (for cough).   aspirin 325 MG EC tablet Take 325 mg by mouth daily. Reported on 12/01/2015   budesonide-formoterol (SYMBICORT) 80-4.5 MCG/ACT inhaler Inhale 2 puffs into the lungs 2 (two) times daily. May use as needed for rescue as well. Rinse after use.   Cholecalciferol (VITAMIN D3) 125 MCG (5000 UT) CAPS Take 1 capsule by mouth daily.   doxycycline (VIBRA-TABS) 100 MG tablet Take 1 tablet (100 mg total) by mouth 2 (two) times daily.   estradiol (ESTRACE) 0.1 MG/GM vaginal cream Use a small amount daily as needed, typically 3-5 times per week, 1 tube/month.  Do not dispense med from [facility]   fluticasone (FLONASE) 50 MCG/ACT nasal spray 2 sprays in each nostril once a day   hydrochlorothiazide (HYDRODIURIL) 12.5 MG tablet Take 0.5 tablets (6.25 mg total) by mouth daily.   lisinopril (ZESTRIL) 10 MG tablet Take 1 tablet (10 mg total) by mouth daily.   Multiple Vitamin (MULTIVITAMIN) tablet Take 1 tablet by mouth daily.   nystatin cream (MYCOSTATIN) Apply 1 application topically 2 (two) times daily.   omeprazole (PRILOSEC OTC) 20 MG tablet Take 1 tablet (20 mg total) by mouth daily.   pravastatin (PRAVACHOL) 80 MG tablet Take 1 tablet by mouth once daily   Probiotic Product ([place]) CAPS Take by mouth daily. Reported on 12/01/2015   No facility-administered encounter medications on file as of 06/07/2022.   Lab Results  Component Value Date/Time   HGBA1C 6.5 (A) 03/17/2022 09:17 AM   HGBA1C 6.7 (A) 11/29/2021 10:12 AM   HGBA1C 6.8 (H) 07/23/2021 08:34 AM   HGBA1C 6.6 (H) 06/11/2020 08:16 AM   MICROALBUR 1.2 12/17/2010 08:51 AM   MICROALBUR 1.5 08/17/2009 09:07 AM    BP Readings from Last 3 Encounters:   05/02/22 118/62  04/19/22 118/60  03/17/22 120/78   Patient contacted to confirm telephone appointment with [name], PharmD, on 06/08/2022 at 3:45.  Do you have any problems getting your medications? No  Patient needs a refill of Meloxicam 7.5 mg. She is completely out of the medication.  [facility] [address]   What is your top health concern you would like to discuss at your upcoming visit? Patient did not have any at this time.   Have you seen any other providers since your last visit with PCP? Yes  Star Rating Drugs:  Medication:  Last Fill: Day Supply Lisinopril 10 mg 03/10/2022      90 Pravastatin 80 mg 04/22/2022 90   Care Gaps: Annual wellness visit in last year? Yes 07/30/21  If Diabetic: Last eye exam / retinopathy screening: 05/18/2021 Last diabetic foot exam: Up to date  [name], CPP notified  [name], [place] Pharm[facility] Assistant [phone]

## 2022-06-07 NOTE — Addendum Note (Signed)
Addended by: Charlton Haws on: 06/07/2022 02:21 PM   Modules accepted: Orders

## 2022-06-08 ENCOUNTER — Ambulatory Visit: Payer: Medicare HMO | Admitting: Pharmacist

## 2022-06-08 MED ORDER — MELOXICAM 7.5 MG PO TABS: 7.5000 mg | ORAL_TABLET | Freq: Two times a day (BID) | ORAL | 1 refills | Status: DC

## 2022-06-08 NOTE — Telephone Encounter (Signed)
Sent. Thanks.   

## 2022-06-08 NOTE — Addendum Note (Signed)
Addended by: [name] on: 06/08/2022 03:08 PM   Modules accepted: Orders
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
## 2022-06-09 ENCOUNTER — Telehealth: Payer: Self-pay | Admitting: Pharmacist

## 2022-06-09 NOTE — Telephone Encounter (Signed)
Patient has a new formulary this year and primary team is trying to find the best option for inhaler for this patient. Per [place] 2024 formulary, Wixela and fluticasone-salmterol diskus are listed as Tier 2, actual copays are unknown and pt is not sure what she will be able to afford.  Forwarding request to prior auth team to perform test claims for each inhaler:  Wixela 250-50 mcg/act - #1 inhaler. Inhale 2 puffs BID.  Fluticasone-salmeterol diskus 250-50 mcg/act - #1 inhaler. Inhale 2 puffs BID.

## 2022-06-09 NOTE — Patient Instructions (Signed)
Visit Information  Phone number for Pharmacist: [phone]  Thank you for meeting with me to discuss your medications! Below is a summary of what we talked about during the visit:   Recommendations/Changes made from today's visit: -Advised to check BP daily for 2 weeks; may be able to d/c HCTZ if it is low -Switch Symbicort to [employer] or Fluticasone/salmeterol diskus (Tier 2) - awaiting test claim from prior auth team for decision  Follow up Plan: -[place] will call patient 2 weeks -Pharmacist follow up televisit scheduled for 6 months -PCP appt 09/16/22 (CPE)   [name], PharmD, BCACP Clinical Pharmacist [facility] [facility] [phone]

## 2022-06-14 ENCOUNTER — Other Ambulatory Visit (HOSPITAL_COMMUNITY): Payer: Self-pay

## 2022-06-14 NOTE — Telephone Encounter (Addendum)
Recommend to switch Symbicort to Wixela 250-50 mcg. Copay will be $10/inhaler.  Pt still has 2-3 month supply remaining of Symbicort. She can finish this and then switch to [facility] when she runs out.   [id number] [place], [place]Phone: [phone]: [phone]

## 2022-06-14 NOTE — Telephone Encounter (Addendum)
I'm sorry, I was thinking of the Symbicort SIG with 2 puffs BID. The SIG for these 2 inhalers should be 1 puff BID - it should be 1 inhaler per month. If that is the case, am I understanding right that both of these would be $10 per month?  And per formulary they specifically list the Wixela device and the Fluticasone-salmeterol (generic) Diskus. Not Advair HFA. So I do not need info for the [facility].

## 2022-06-15 ENCOUNTER — Telehealth: Payer: Self-pay | Admitting: Family Medicine

## 2022-06-15 MED ORDER — FLUTICASONE-SALMETEROL 250-50 MCG/ACT IN AEPB: 1.0000 | INHALATION_SPRAY | Freq: Two times a day (BID) | RESPIRATORY_TRACT | 3 refills | Status: DC

## 2022-06-15 MED ORDER — HYDROCHLOROTHIAZIDE 12.5 MG PO TABS: 6.2500 mg | ORAL_TABLET | Freq: Every day | ORAL | 3 refills | Status: DC

## 2022-06-15 MED ORDER — LISINOPRIL 10 MG PO TABS: 10.0000 mg | ORAL_TABLET | Freq: Every day | ORAL | 3 refills | Status: DC

## 2022-06-15 NOTE — Telephone Encounter (Signed)
Patient notified and is aware that prescription has been changed for when she runs out of Symbicort.

## 2022-06-15 NOTE — Telephone Encounter (Signed)
Prescription Request  06/15/2022  Is this a "Controlled Substance" medicine? No  LOV: 05/02/2022  What is the name of the medication or equipment? hydrochlorothiazide (HYDRODIURIL) 12.5 MG tablet  lisinopril (ZESTRIL) 10 MG tablet  Have you contacted your pharmacy to request a refill? Yes   Which pharmacy would you like this sent to?   [id number]Phone: [phone]: [phone]   Patient notified that their request is being sent to the clinical staff for review and that they should receive a response within 2 business days.   Please advise at Mobile [phone](mobile)

## 2022-06-15 NOTE — Telephone Encounter (Signed)
[facility]

## 2022-06-15 NOTE — Telephone Encounter (Signed)
I sent the prescription so she can make the change when she runs out of Symbicort.  Please let me know if she cannot get it filled.  I thank all involved.

## 2022-06-15 NOTE — Telephone Encounter (Signed)
Patient stated that she called in before christmas to ask Dr Damita Dunnings to send all of her [facility]  [id number] [place], [place]: [phone] Fax: [phone]   Because thats the pharmacy that she uses now.She stated that this has not been done and she doesn't want to have to call every time to get a rx filled

## 2022-06-15 NOTE — Addendum Note (Signed)
Addended by: [name] on: 06/15/2022 03:14 PM   Modules accepted: Orders

## 2022-06-15 NOTE — Telephone Encounter (Signed)
[facility] has been removed from her pharmacy list

## 2022-06-24 ENCOUNTER — Telehealth: Payer: Self-pay

## 2022-06-24 NOTE — Progress Notes (Signed)
Called patient for blood pressure readings as discussed with [name],. Patient did not have any readings. I have asked patient to take their blood pressure daily and keep a log. Advised patient I would call back on 07/01/2022 for log. Patient verbalized understanding and agreed.   [name], PharmD notified  [name], [place] Clinical Pharmacy Assistant [phone]

## 2022-06-24 NOTE — Telephone Encounter (Signed)
-----  Message from [place], [facility] sent at 06/09/2022 12:03 PM EST ----- Regarding: BP call Please contact patient for BP readings - I advised her to monitor for the last 2 weeks.

## 2022-06-30 DIAGNOSIS — G5601 Carpal tunnel syndrome, right upper limb: Secondary | ICD-10-CM | POA: Diagnosis not present

## 2022-07-01 NOTE — Progress Notes (Cosign Needed)
Called patient for blood pressure readings as previously discussed.  Date  Blood Pressure 01/26  Carpel Tunnel Surgery 01/25  134/75  01/24  147/80   01/23  --------- 01/22  167/80   01/21  163/81   01/20  121/81     [name], PharmD notified  [name], [place] Clinical Pharmacy Assistant [phone]

## 2022-07-12 ENCOUNTER — Telehealth: Payer: Self-pay | Admitting: Family Medicine

## 2022-07-12 MED ORDER — PRAVASTATIN SODIUM 80 MG PO TABS: 80.0000 mg | ORAL_TABLET | Freq: Every day | ORAL | 2 refills | Status: DC

## 2022-07-12 NOTE — Telephone Encounter (Signed)
Prescription Request  07/12/2022  Is this a "Controlled Substance" medicine? No  LOV: 05/02/2022  What is the name of the medication or equipment? pravastatin (PRAVACHOL) 80 MG tablet   Have you contacted your pharmacy to request a refill? Yes   Which pharmacy would you like this sent to?  [id number]Phone: [phone]: [phone]   Patient notified that their request is being sent to the clinical staff for review and that they should receive a response within 2 business days.   Please advise at Mobile [phone](mobile)

## 2022-07-12 NOTE — Telephone Encounter (Signed)
Erx sent

## 2022-07-14 DIAGNOSIS — M79641 Pain in right hand: Secondary | ICD-10-CM | POA: Diagnosis not present

## 2022-07-27 DIAGNOSIS — G5602 Carpal tunnel syndrome, left upper limb: Secondary | ICD-10-CM | POA: Diagnosis not present

## 2022-07-27 HISTORY — PX: CARPAL TUNNEL RELEASE: SHX101

## 2022-08-12 ENCOUNTER — Telehealth: Payer: Self-pay | Admitting: Family Medicine

## 2022-08-12 NOTE — Telephone Encounter (Signed)
I left a message on patient's voice mail to remind her of [place] phone call on 08/15/2022 at 11:15.

## 2022-08-15 ENCOUNTER — Ambulatory Visit (INDEPENDENT_AMBULATORY_CARE_PROVIDER_SITE_OTHER): Payer: Medicare HMO

## 2022-08-15 VITALS — Ht <= 58 in | Wt 162.0 lb

## 2022-08-15 DIAGNOSIS — Z0000 Encounter for general adult medical examination without abnormal findings: Secondary | ICD-10-CM | POA: Diagnosis not present

## 2022-08-15 NOTE — Patient Instructions (Addendum)
Katie [name] , Thank you for taking time to come for your Medicare Wellness Visit. I appreciate your ongoing commitment to your health goals. Please review the following plan we discussed and let me know if I can assist you in the future.   These are the goals we discussed:  Goals      Weight (lb) < 131 lb (59.4 kg)     Starting 05/08/2017, I will continue to eat a low carbohydrate diet in an effort to lose 10 additional pounds        This is a list of the screening recommended for you and due dates:  Health Maintenance  Topic Date Due   Yearly kidney health urinalysis for diabetes  12/17/2011   COVID-19 Vaccine (4 - 2023-24 season) 08/31/2022*   Zoster (Shingles) Vaccine (1 of 2) 11/15/2022*   Pneumonia Vaccine (3 of 3 - PPSV23 or PCV20) 08/15/2023*   Hemoglobin A1C  09/16/2022   Mammogram  11/26/2022   Complete foot exam   03/18/2023   Yearly kidney function blood test for diabetes  05/03/2023   Eye exam for diabetics  05/21/2023   Cologuard (Stool DNA test)  07/08/2023   Medicare Annual Wellness Visit  08/15/2023   DTaP/Tdap/Td vaccine (3 - Td or Tdap) 08/04/2024   Flu Shot  Completed   DEXA scan (bone density measurement)  Completed   Hepatitis C Screening: USPSTF Recommendation to screen - Ages 79-79 yo.  Completed   HPV Vaccine  Aged Out  *Topic was postponed. The date shown is not the original due date.   Advanced directives: End of life planning; Advance aging; Advanced directives discussed.  Copy of current HCPOA/Living Will requested.    Conditions/risks identified: none new.   Next appointment: Follow up in one year for your annual wellness visit    Preventive Care 65 Years and Older, Female Preventive care refers to lifestyle choices and visits with your health care provider that can promote health and wellness. What does preventive care include? A yearly physical exam. This is also called an annual well check. Dental exams once or twice a year. Routine eye exams.  Ask your health care provider how often you should have your eyes checked. Personal lifestyle choices, including: Daily care of your teeth and gums. Regular physical activity. Eating a healthy diet. Avoiding tobacco and drug use. Limiting alcohol use. Practicing safe sex. Taking low-dose aspirin every day. Taking vitamin and mineral supplements as recommended by your health care provider. What happens during an annual well check? The services and screenings done by your health care provider during your annual well check will depend on your age, overall health, lifestyle risk factors, and family history of disease. Counseling  Your health care provider may ask you questions about your: Alcohol use. Tobacco use. Drug use. Emotional well-being. Home and relationship well-being. Sexual activity. Eating habits. History of falls. Memory and ability to understand (cognition). Work and work Statistician. Reproductive health. Screening  You may have the following tests or measurements: Height, weight, and BMI. Blood pressure. Lipid and cholesterol levels. These may be checked every 5 years, or more frequently if you are over 30 years old. Skin check. Lung cancer screening. You may have this screening every year starting at age 62 if you have a 30-pack-year history of smoking and currently smoke or have quit within the past 15 years. Fecal occult blood test (FOBT) of the stool. You may have this test every year starting at age 10. Flexible sigmoidoscopy or  colonoscopy. You may have a sigmoidoscopy every 5 years or a colonoscopy every 10 years starting at age 53. Hepatitis C blood test. Hepatitis B blood test. Sexually transmitted disease (STD) testing. Diabetes screening. This is done by checking your blood sugar (glucose) after you have not eaten for a while (fasting). You may have this done every 1-3 years. Bone density scan. This is done to screen for osteoporosis. You may have this  done starting at age 26. Mammogram. This may be done every 1-2 years. Talk to your health care provider about how often you should have regular mammograms. Talk with your health care provider about your test results, treatment options, and if necessary, the need for more tests. Vaccines  Your health care provider may recommend certain vaccines, such as: Influenza vaccine. This is recommended every year. Tetanus, diphtheria, and acellular pertussis (Tdap, Td) vaccine. You may need a Td booster every 10 years. Zoster vaccine. You may need this after age 81. Pneumococcal 13-valent conjugate (PCV13) vaccine. One dose is recommended after age 71. Pneumococcal polysaccharide (PPSV23) vaccine. One dose is recommended after age 72. Talk to your health care provider about which screenings and vaccines you need and how often you need them. This information is not intended to replace advice given to you by your health care provider. Make sure you discuss any questions you have with your health care provider. Document Released: 06/19/2015 Document Revised: 02/10/2016 Document Reviewed: 03/24/2015 Elsevier Interactive Patient Education  2017 [place] Prevention in the Home Falls can cause injuries. They can happen to people of all ages. There are many things you can do to make your home safe and to help prevent falls. What can I do on the outside of my home? Regularly fix the edges of walkways and driveways and fix any cracks. Remove anything that might make you trip as you walk through a door, such as a raised step or threshold. Trim any bushes or trees on the path to your home. Use bright outdoor lighting. Clear any walking paths of anything that might make someone trip, such as rocks or tools. Regularly check to see if handrails are loose or broken. Make sure that both sides of any steps have handrails. Any raised decks and porches should have guardrails on the edges. Have any leaves,  snow, or ice cleared regularly. Use sand or salt on walking paths during winter. Clean up any spills in your garage right away. This includes oil or grease spills. What can I do in the bathroom? Use night lights. Install grab bars by the toilet and in the tub and shower. Do not use towel bars as grab bars. Use non-skid mats or decals in the tub or shower. If you need to sit down in the shower, use a plastic, non-slip stool. Keep the floor dry. Clean up any water that spills on the floor as soon as it happens. Remove soap buildup in the tub or shower regularly. Attach bath mats securely with double-sided non-slip rug tape. Do not have throw rugs and other things on the floor that can make you trip. What can I do in the bedroom? Use night lights. Make sure that you have a light by your bed that is easy to reach. Do not use any sheets or blankets that are too big for your bed. They should not hang down onto the floor. Have a firm chair that has side arms. You can use this for support while you get dressed. Do not  have throw rugs and other things on the floor that can make you trip. What can I do in the kitchen? Clean up any spills right away. Avoid walking on wet floors. Keep items that you use a lot in easy-to-reach places. If you need to reach something above you, use a strong step stool that has a grab bar. Keep electrical cords out of the way. Do not use floor polish or wax that makes floors slippery. If you must use wax, use non-skid floor wax. Do not have throw rugs and other things on the floor that can make you trip. What can I do with my stairs? Do not leave any items on the stairs. Make sure that there are handrails on both sides of the stairs and use them. Fix handrails that are broken or loose. Make sure that handrails are as long as the stairways. Check any carpeting to make sure that it is firmly attached to the stairs. Fix any carpet that is loose or worn. Avoid having throw  rugs at the top or bottom of the stairs. If you do have throw rugs, attach them to the floor with carpet tape. Make sure that you have a light switch at the top of the stairs and the bottom of the stairs. If you do not have them, ask someone to add them for you. What else can I do to help prevent falls? Wear shoes that: Do not have high heels. Have rubber bottoms. Are comfortable and fit you well. Are closed at the toe. Do not wear sandals. If you use a stepladder: Make sure that it is fully opened. Do not climb a closed stepladder. Make sure that both sides of the stepladder are locked into place. Ask someone to hold it for you, if possible. Clearly mark and make sure that you can see: Any grab bars or handrails. First and last steps. Where the edge of each step is. Use tools that help you move around (mobility aids) if they are needed. These include: Canes. Walkers. Scooters. Crutches. Turn on the lights when you go into a dark area. Replace any light bulbs as soon as they burn out. Set up your furniture so you have a clear path. Avoid moving your furniture around. If any of your floors are uneven, fix them. If there are any pets around you, be aware of where they are. Review your medicines with your doctor. Some medicines can make you feel dizzy. This can increase your chance of falling. Ask your doctor what other things that you can do to help prevent falls. This information is not intended to replace advice given to you by your health care provider. Make sure you discuss any questions you have with your health care provider. Document Released: 03/19/2009 Document Revised: 10/29/2015 Document Reviewed: 06/27/2014 Elsevier Interactive Patient Education  2017 [employer].
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
## 2022-08-22 DIAGNOSIS — G5602 Carpal tunnel syndrome, left upper limb: Secondary | ICD-10-CM | POA: Diagnosis not present

## 2022-08-22 HISTORY — PX: CARPAL TUNNEL RELEASE: SHX101

## 2022-08-26 ENCOUNTER — Other Ambulatory Visit: Payer: Self-pay | Admitting: Family Medicine

## 2022-08-26 DIAGNOSIS — M109 Gout, unspecified: Secondary | ICD-10-CM

## 2022-08-26 DIAGNOSIS — E119 Type 2 diabetes mellitus without complications: Secondary | ICD-10-CM

## 2022-08-26 DIAGNOSIS — E559 Vitamin D deficiency, unspecified: Secondary | ICD-10-CM

## 2022-09-01 ENCOUNTER — Inpatient Hospital Stay (HOSPITAL_COMMUNITY): Payer: Medicare HMO

## 2022-09-01 ENCOUNTER — Emergency Department (HOSPITAL_COMMUNITY): Payer: Medicare HMO

## 2022-09-01 ENCOUNTER — Inpatient Hospital Stay (HOSPITAL_COMMUNITY)
Admission: EM | Admit: 2022-09-01 | Discharge: 2022-09-05 | DRG: 100 | Disposition: A | Discharge status: Home / Self Care | Payer: Medicare HMO | Attending: Internal Medicine | Admitting: Internal Medicine

## 2022-09-01 DIAGNOSIS — J4530 Mild persistent asthma, uncomplicated: Secondary | ICD-10-CM | POA: Diagnosis not present

## 2022-09-01 DIAGNOSIS — M79641 Pain in right hand: Secondary | ICD-10-CM | POA: Diagnosis present

## 2022-09-01 DIAGNOSIS — Z9011 Acquired absence of right breast and nipple: Secondary | ICD-10-CM | POA: Diagnosis not present

## 2022-09-01 DIAGNOSIS — G40401 Other generalized epilepsy and epileptic syndromes, not intractable, with status epilepticus: Principal | ICD-10-CM | POA: Diagnosis present

## 2022-09-01 DIAGNOSIS — Z79899 Other long term (current) drug therapy: Secondary | ICD-10-CM

## 2022-09-01 DIAGNOSIS — R0603 Acute respiratory distress: Secondary | ICD-10-CM

## 2022-09-01 DIAGNOSIS — S0101XA Laceration without foreign body of scalp, initial encounter: Secondary | ICD-10-CM | POA: Diagnosis present

## 2022-09-01 DIAGNOSIS — E669 Obesity, unspecified: Secondary | ICD-10-CM | POA: Diagnosis present

## 2022-09-01 DIAGNOSIS — Z87440 Personal history of urinary (tract) infections: Secondary | ICD-10-CM

## 2022-09-01 DIAGNOSIS — R000 Tachycardia, unspecified: Secondary | ICD-10-CM | POA: Diagnosis not present

## 2022-09-01 DIAGNOSIS — R0902 Hypoxemia: Secondary | ICD-10-CM | POA: Diagnosis not present

## 2022-09-01 DIAGNOSIS — S0003XA Contusion of scalp, initial encounter: Secondary | ICD-10-CM | POA: Diagnosis not present

## 2022-09-01 DIAGNOSIS — Z7982 Long term (current) use of aspirin: Secondary | ICD-10-CM

## 2022-09-01 DIAGNOSIS — R4182 Altered mental status, unspecified: Secondary | ICD-10-CM | POA: Diagnosis not present

## 2022-09-01 DIAGNOSIS — Z23 Encounter for immunization: Secondary | ICD-10-CM

## 2022-09-01 DIAGNOSIS — Z91041 Radiographic dye allergy status: Secondary | ICD-10-CM | POA: Diagnosis not present

## 2022-09-01 DIAGNOSIS — Z885 Allergy status to narcotic agent status: Secondary | ICD-10-CM

## 2022-09-01 DIAGNOSIS — Z803 Family history of malignant neoplasm of breast: Secondary | ICD-10-CM

## 2022-09-01 DIAGNOSIS — Z8673 Personal history of transient ischemic attack (TIA), and cerebral infarction without residual deficits: Secondary | ICD-10-CM | POA: Diagnosis not present

## 2022-09-01 DIAGNOSIS — E7800 Pure hypercholesterolemia, unspecified: Secondary | ICD-10-CM | POA: Diagnosis not present

## 2022-09-01 DIAGNOSIS — Z91013 Allergy to seafood: Secondary | ICD-10-CM | POA: Diagnosis not present

## 2022-09-01 DIAGNOSIS — I4510 Unspecified right bundle-branch block: Secondary | ICD-10-CM | POA: Diagnosis present

## 2022-09-01 DIAGNOSIS — Z853 Personal history of malignant neoplasm of breast: Secondary | ICD-10-CM

## 2022-09-01 DIAGNOSIS — Z881 Allergy status to other antibiotic agents status: Secondary | ICD-10-CM | POA: Diagnosis not present

## 2022-09-01 DIAGNOSIS — R569 Unspecified convulsions: Secondary | ICD-10-CM | POA: Diagnosis not present

## 2022-09-01 DIAGNOSIS — Z8616 Personal history of COVID-19: Secondary | ICD-10-CM

## 2022-09-01 DIAGNOSIS — E872 Acidosis, unspecified: Secondary | ICD-10-CM | POA: Diagnosis not present

## 2022-09-01 DIAGNOSIS — M4312 Spondylolisthesis, cervical region: Secondary | ICD-10-CM | POA: Diagnosis not present

## 2022-09-01 DIAGNOSIS — T884XXA Failed or difficult intubation, initial encounter: Secondary | ICD-10-CM | POA: Diagnosis present

## 2022-09-01 DIAGNOSIS — I10 Essential (primary) hypertension: Secondary | ICD-10-CM | POA: Diagnosis present

## 2022-09-01 DIAGNOSIS — S0990XA Unspecified injury of head, initial encounter: Secondary | ICD-10-CM | POA: Diagnosis not present

## 2022-09-01 DIAGNOSIS — W19XXXA Unspecified fall, initial encounter: Secondary | ICD-10-CM | POA: Diagnosis present

## 2022-09-01 DIAGNOSIS — I6381 Other cerebral infarction due to occlusion or stenosis of small artery: Secondary | ICD-10-CM | POA: Diagnosis not present

## 2022-09-01 DIAGNOSIS — Z833 Family history of diabetes mellitus: Secondary | ICD-10-CM

## 2022-09-01 DIAGNOSIS — R918 Other nonspecific abnormal finding of lung field: Secondary | ICD-10-CM | POA: Diagnosis not present

## 2022-09-01 DIAGNOSIS — I959 Hypotension, unspecified: Secondary | ICD-10-CM | POA: Diagnosis not present

## 2022-09-01 DIAGNOSIS — Y92009 Unspecified place in unspecified non-institutional (private) residence as the place of occurrence of the external cause: Secondary | ICD-10-CM | POA: Diagnosis not present

## 2022-09-01 DIAGNOSIS — M79642 Pain in left hand: Secondary | ICD-10-CM | POA: Diagnosis present

## 2022-09-01 DIAGNOSIS — J9601 Acute respiratory failure with hypoxia: Secondary | ICD-10-CM | POA: Diagnosis present

## 2022-09-01 DIAGNOSIS — K219 Gastro-esophageal reflux disease without esophagitis: Secondary | ICD-10-CM | POA: Diagnosis present

## 2022-09-01 DIAGNOSIS — S3993XA Unspecified injury of pelvis, initial encounter: Secondary | ICD-10-CM | POA: Diagnosis not present

## 2022-09-01 DIAGNOSIS — G40901 Epilepsy, unspecified, not intractable, with status epilepticus: Secondary | ICD-10-CM | POA: Diagnosis not present

## 2022-09-01 DIAGNOSIS — Z743 Need for continuous supervision: Secondary | ICD-10-CM | POA: Diagnosis not present

## 2022-09-01 DIAGNOSIS — E119 Type 2 diabetes mellitus without complications: Secondary | ICD-10-CM | POA: Diagnosis not present

## 2022-09-01 DIAGNOSIS — J9811 Atelectasis: Secondary | ICD-10-CM | POA: Diagnosis not present

## 2022-09-01 DIAGNOSIS — Z6836 Body mass index (BMI) 36.0-36.9, adult: Secondary | ICD-10-CM

## 2022-09-01 DIAGNOSIS — Z9049 Acquired absence of other specified parts of digestive tract: Secondary | ICD-10-CM

## 2022-09-01 DIAGNOSIS — Z888 Allergy status to other drugs, medicaments and biological substances status: Secondary | ICD-10-CM | POA: Diagnosis not present

## 2022-09-01 DIAGNOSIS — R0602 Shortness of breath: Secondary | ICD-10-CM | POA: Diagnosis not present

## 2022-09-01 DIAGNOSIS — Z8249 Family history of ischemic heart disease and other diseases of the circulatory system: Secondary | ICD-10-CM

## 2022-09-01 DIAGNOSIS — S0990XD Unspecified injury of head, subsequent encounter: Secondary | ICD-10-CM | POA: Diagnosis not present

## 2022-09-01 DIAGNOSIS — M47812 Spondylosis without myelopathy or radiculopathy, cervical region: Secondary | ICD-10-CM | POA: Diagnosis not present

## 2022-09-01 DIAGNOSIS — R58 Hemorrhage, not elsewhere classified: Secondary | ICD-10-CM | POA: Diagnosis not present

## 2022-09-01 DIAGNOSIS — Z800 Family history of malignant neoplasm of digestive organs: Secondary | ICD-10-CM

## 2022-09-01 DIAGNOSIS — R404 Transient alteration of awareness: Secondary | ICD-10-CM | POA: Diagnosis not present

## 2022-09-01 DIAGNOSIS — J9690 Respiratory failure, unspecified, unspecified whether with hypoxia or hypercapnia: Secondary | ICD-10-CM | POA: Diagnosis not present

## 2022-09-01 LAB — I-STAT ARTERIAL BLOOD GAS, ED
Acid-base deficit: 9 mmol/L — ABNORMAL HIGH (ref 0.0–2.0)
Bicarbonate: 21.9 mmol/L (ref 20.0–28.0)
Calcium, Ion: 1.31 mmol/L (ref 1.15–1.40)
HCT: 33 % — ABNORMAL LOW (ref 36.0–46.0)
Hemoglobin: 11.2 g/dL — ABNORMAL LOW (ref 12.0–15.0)
O2 Saturation: 100 %
Potassium: 3.9 mmol/L (ref 3.5–5.1)
Sodium: 136 mmol/L (ref 135–145)
TCO2: 24 mmol/L (ref 22–32)
pCO2 arterial: 72.7 mmHg (ref 32–48)
pH, Arterial: 7.086 — CL (ref 7.35–7.45)
pO2, Arterial: 323 mmHg — ABNORMAL HIGH (ref 83–108)

## 2022-09-01 LAB — COMPREHENSIVE METABOLIC PANEL
ALT: 13 U/L (ref 0–44)
AST: 30 U/L (ref 15–41)
Albumin: 3.7 g/dL (ref 3.5–5.0)
Alkaline Phosphatase: 85 U/L (ref 38–126)
Anion gap: 26 — ABNORMAL HIGH (ref 5–15)
BUN: 22 mg/dL (ref 8–23)
CO2: 11 mmol/L — ABNORMAL LOW (ref 22–32)
Calcium: 9.3 mg/dL (ref 8.9–10.3)
Chloride: 102 mmol/L (ref 98–111)
Creatinine, Ser: 1.27 mg/dL — ABNORMAL HIGH (ref 0.44–1.00)
GFR, Estimated: 44 mL/min — ABNORMAL LOW (ref >60)
Glucose, Bld: 173 mg/dL — ABNORMAL HIGH (ref 70–99)
Potassium: 3.6 mmol/L (ref 3.5–5.1)
Sodium: 139 mmol/L (ref 135–145)
Total Bilirubin: 0.6 mg/dL (ref 0.3–1.2)
Total Protein: 7.6 g/dL (ref 6.5–8.1)

## 2022-09-01 LAB — URINALYSIS, ROUTINE W REFLEX MICROSCOPIC
Bilirubin Urine: NEGATIVE
Glucose, UA: NEGATIVE mg/dL
Ketones, ur: NEGATIVE mg/dL
Leukocytes,Ua: NEGATIVE
Nitrite: NEGATIVE
Protein, ur: 100 mg/dL — AB
Specific Gravity, Urine: 1.014 (ref 1.005–1.030)
pH: 5 (ref 5.0–8.0)

## 2022-09-01 LAB — POCT I-STAT 7, (LYTES, BLD GAS, ICA,H+H)
Acid-base deficit: 2 mmol/L (ref 0.0–2.0)
Bicarbonate: 24.2 mmol/L (ref 20.0–28.0)
Calcium, Ion: 1.25 mmol/L (ref 1.15–1.40)
HCT: 34 % — ABNORMAL LOW (ref 36.0–46.0)
Hemoglobin: 11.6 g/dL — ABNORMAL LOW (ref 12.0–15.0)
O2 Saturation: 98 %
Patient temperature: 37
Potassium: 3.6 mmol/L (ref 3.5–5.1)
Sodium: 134 mmol/L — ABNORMAL LOW (ref 135–145)
TCO2: 26 mmol/L (ref 22–32)
pCO2 arterial: 47.4 mmHg (ref 32–48)
pH, Arterial: 7.316 — ABNORMAL LOW (ref 7.35–7.45)
pO2, Arterial: 112 mmHg — ABNORMAL HIGH (ref 83–108)

## 2022-09-01 LAB — LACTIC ACID, PLASMA
Lactic Acid, Venous: >9 mmol/L (ref 0.5–1.9)
Lactic Acid, Venous: 4.4 mmol/L (ref 0.5–1.9)

## 2022-09-01 LAB — ETHANOL: Alcohol, Ethyl (B): <10 mg/dL (ref <10)

## 2022-09-01 LAB — MRSA NEXT GEN BY PCR, NASAL: MRSA by PCR Next Gen: NOT DETECTED

## 2022-09-01 LAB — CBC
HCT: 44.2 % (ref 36.0–46.0)
Hemoglobin: 12.9 g/dL (ref 12.0–15.0)
MCH: 29.3 pg (ref 26.0–34.0)
MCHC: 29.2 g/dL — ABNORMAL LOW (ref 30.0–36.0)
MCV: 100.2 fL — ABNORMAL HIGH (ref 80.0–100.0)
Platelets: 466 10*3/uL — ABNORMAL HIGH (ref 150–400)
RBC: 4.41 MIL/uL (ref 3.87–5.11)
RDW: 14.6 % (ref 11.5–15.5)
WBC: 18.4 10*3/uL — ABNORMAL HIGH (ref 4.0–10.5)
nRBC: 0 % (ref 0.0–0.2)

## 2022-09-01 LAB — I-STAT CHEM 8, ED
BUN: 24 mg/dL — ABNORMAL HIGH (ref 8–23)
Calcium, Ion: 1.16 mmol/L (ref 1.15–1.40)
Chloride: 109 mmol/L (ref 98–111)
Creatinine, Ser: 0.9 mg/dL (ref 0.44–1.00)
Glucose, Bld: 165 mg/dL — ABNORMAL HIGH (ref 70–99)
HCT: 42 % (ref 36.0–46.0)
Hemoglobin: 14.3 g/dL (ref 12.0–15.0)
Potassium: 3.7 mmol/L (ref 3.5–5.1)
Sodium: 139 mmol/L (ref 135–145)
TCO2: 14 mmol/L — ABNORMAL LOW (ref 22–32)

## 2022-09-01 LAB — TROPONIN I (HIGH SENSITIVITY)
Troponin I (High Sensitivity): 14 ng/L (ref <18)
Troponin I (High Sensitivity): 57 ng/L — ABNORMAL HIGH (ref <18)

## 2022-09-01 LAB — PROTIME-INR
INR: 1.1 (ref 0.8–1.2)
Prothrombin Time: 13.7 seconds (ref 11.4–15.2)

## 2022-09-01 LAB — CBG MONITORING, ED
Glucose-Capillary: 179 mg/dL — ABNORMAL HIGH (ref 70–99)
Glucose-Capillary: 139 mg/dL — ABNORMAL HIGH (ref 70–99)

## 2022-09-01 LAB — GLUCOSE, CAPILLARY: Glucose-Capillary: 155 mg/dL — ABNORMAL HIGH (ref 70–99)

## 2022-09-01 LAB — MAGNESIUM: Magnesium: 2.1 mg/dL (ref 1.7–2.4)

## 2022-09-01 MED ORDER — ETOMIDATE 2 MG/ML IV SOLN
INTRAVENOUS | Status: AC | PRN
  Administered 2022-09-01: 20 mg via INTRAVENOUS

## 2022-09-01 MED ORDER — IPRATROPIUM-ALBUTEROL 0.5-2.5 (3) MG/3ML IN SOLN: 3.0000 mL | Freq: Four times a day (QID) | RESPIRATORY_TRACT | Status: DC | PRN

## 2022-09-01 MED ORDER — CHLORHEXIDINE GLUCONATE CLOTH 2 % EX PADS
6.0000 | MEDICATED_PAD | Freq: Every day | CUTANEOUS | Status: DC
  Administered 2022-09-01 – 2022-09-04 (×4): 6 via TOPICAL

## 2022-09-01 MED ORDER — TETANUS-DIPHTH-ACELL PERTUSSIS 5-2.5-18.5 LF-MCG/0.5 IM SUSY
0.5000 mL | PREFILLED_SYRINGE | Freq: Once | INTRAMUSCULAR | Status: AC
  Administered 2022-09-01: 0.5 mL via INTRAMUSCULAR
  Filled 2022-09-01: qty 0.5

## 2022-09-01 MED ORDER — GADOBUTROL 1 MMOL/ML IV SOLN
7.5000 mL | Freq: Once | INTRAVENOUS | Status: AC | PRN
  Administered 2022-09-01: 7.5 mL via INTRAVENOUS

## 2022-09-01 MED ORDER — POLYETHYLENE GLYCOL 3350 17 G PO PACK: 17.0000 g | PACK | Freq: Every day | ORAL | Status: DC | PRN

## 2022-09-01 MED ORDER — LACTATED RINGERS IV BOLUS
1000.0000 mL | Freq: Once | INTRAVENOUS | Status: AC
  Administered 2022-09-01: 1000 mL via INTRAVENOUS

## 2022-09-01 MED ORDER — DOCUSATE SODIUM 100 MG PO CAPS: 100.0000 mg | ORAL_CAPSULE | Freq: Two times a day (BID) | ORAL | Status: DC | PRN

## 2022-09-01 MED ORDER — PROPOFOL 1000 MG/100ML IV EMUL
5.0000 ug/kg/min | INTRAVENOUS | Status: DC
  Administered 2022-09-01: 50 ug/kg/min via INTRAVENOUS
  Administered 2022-09-02: 30 ug/kg/min via INTRAVENOUS
  Filled 2022-09-01 (×3): qty 100

## 2022-09-01 MED ORDER — FENTANYL BOLUS VIA INFUSION
25.0000 ug | INTRAVENOUS | Status: DC | PRN
  Administered 2022-09-01: 100 ug via INTRAVENOUS

## 2022-09-01 MED ORDER — DEXTROSE-NACL 5-0.45 % IV SOLN: INTRAVENOUS | Status: DC

## 2022-09-01 MED ORDER — DOCUSATE SODIUM 50 MG/5ML PO LIQD: 100.0000 mg | Freq: Two times a day (BID) | ORAL | Status: DC | PRN

## 2022-09-01 MED ORDER — PROPOFOL 1000 MG/100ML IV EMUL: 5.0000 ug/kg/min | INTRAVENOUS | Status: DC

## 2022-09-01 MED ORDER — ARFORMOTEROL TARTRATE 15 MCG/2ML IN NEBU
15.0000 ug | INHALATION_SOLUTION | Freq: Two times a day (BID) | RESPIRATORY_TRACT | Status: DC
  Administered 2022-09-02: 15 ug via RESPIRATORY_TRACT
  Filled 2022-09-01: qty 2

## 2022-09-01 MED ORDER — FENTANYL 2500MCG IN NS 250ML (10MCG/ML) PREMIX INFUSION
25.0000 ug/h | INTRAVENOUS | Status: DC
  Administered 2022-09-01: 50 ug/h via INTRAVENOUS
  Filled 2022-09-01: qty 250

## 2022-09-01 MED ORDER — ASPIRIN 325 MG PO TABS
325.0000 mg | ORAL_TABLET | Freq: Every day | ORAL | Status: DC
  Administered 2022-09-02 – 2022-09-03 (×2): 325 mg
  Filled 2022-09-01 (×2): qty 1

## 2022-09-01 MED ORDER — INSULIN ASPART 100 UNIT/ML IJ SOLN
0.0000 [IU] | INTRAMUSCULAR | Status: DC
  Administered 2022-09-01: 3 [IU] via SUBCUTANEOUS
  Administered 2022-09-02: 2 [IU] via SUBCUTANEOUS

## 2022-09-01 MED ORDER — ORAL CARE MOUTH RINSE
15.0000 mL | OROMUCOSAL | Status: DC
  Administered 2022-09-02 – 2022-09-03 (×13): 15 mL via OROMUCOSAL

## 2022-09-01 MED ORDER — PROPOFOL 1000 MG/100ML IV EMUL
INTRAVENOUS | Status: AC
  Administered 2022-09-01: 40 ug/kg/min via INTRAVENOUS
  Filled 2022-09-01: qty 100

## 2022-09-01 MED ORDER — ROCURONIUM BROMIDE 50 MG/5ML IV SOLN
INTRAVENOUS | Status: AC | PRN
  Administered 2022-09-01: 100 mg via INTRAVENOUS

## 2022-09-01 MED ORDER — LEVETIRACETAM IN NACL 1500 MG/100ML IV SOLN
1500.0000 mg | Freq: Once | INTRAVENOUS | Status: AC
  Administered 2022-09-01: 1500 mg via INTRAVENOUS
  Filled 2022-09-01: qty 100

## 2022-09-01 MED ORDER — ENOXAPARIN SODIUM 40 MG/0.4ML IJ SOSY
40.0000 mg | PREFILLED_SYRINGE | INTRAMUSCULAR | Status: DC
  Administered 2022-09-01 – 2022-09-02 (×2): 40 mg via SUBCUTANEOUS
  Filled 2022-09-01 (×2): qty 0.4

## 2022-09-01 MED ORDER — ORAL CARE MOUTH RINSE: 15.0000 mL | OROMUCOSAL | Status: DC | PRN

## 2022-09-01 MED ORDER — BUDESONIDE 0.5 MG/2ML IN SUSP
0.5000 mg | Freq: Two times a day (BID) | RESPIRATORY_TRACT | Status: DC
  Administered 2022-09-02: 0.5 mg via RESPIRATORY_TRACT
  Filled 2022-09-01: qty 2

## 2022-09-01 NOTE — Progress Notes (Signed)
RT NOTE:  Pt transported on vent to 4N22, without event. Report  given to [place], RT.

## 2022-09-01 NOTE — Progress Notes (Signed)
[facility] Progress Note Patient Name: Katie [name] DOB: [birth year] MRN: [id number]   Date of Service  09/01/2022  HPI/Events of Note  Patient admitted with new onset seizures, seizure induced fall, altered mental status, acute respiratory failure requiring intubation and mechanical ventilation.  eICU Interventions  New Patient Evaluation.        Frederik Pear 09/01/2022, 9:34 PM

## 2022-09-01 NOTE — Progress Notes (Signed)
   09/01/22 1900  Spiritual Encounters  Type of Visit Initial  Care provided to: Patient  Conversation partners present during encounter Nurse  Referral source Trauma page  Reason for visit Trauma   Ch responded to trauma page. No family is present at bedside. No follow-up needed at this time.
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
## 2022-09-01 NOTE — TOC CAGE-AID Note (Signed)
Transition of Care [facility]) - CAGE-AID Screening   Patient Details  Name: Katie [name] MRN: [id number] Date of Birth: [birth year]  Transition of [facility]) CM/SW Contact:    Army Melia, RN Phone [phone] 09/01/2022, 8:41 PM    CAGE-AID Screening: Substance Abuse Screening unable to be completed due to: : Patient unable to participate (currently intubated/sedated)
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
## 2022-09-01 NOTE — ED Provider Notes (Signed)
[facility] Provider Note   CSN: [id number] Arrival date & time: 09/01/22  1752     History  Chief Complaint  Patient presents with   Trauma    Katie [name] is a 77 y.o. female.  77 year old female with history of breast cancer in remission, hypertension, stroke on aspirin who presents to the emergency department with seizures.  Patient had an unwitnessed fall.  Family heard her fall and then noticed that she was seizing.  Unclear if she fell because of the seizure activity.  No history of seizures.  No significant alcohol use or history of withdrawals.  When EMS arrived she was talking again though somewhat confused and then had another seizure.  They said that she had right arm contraction that generalized to a grand mal seizure.  She was given 2.5 mg of Versed in the ED shaking stopped she was confused afterwards.        Home Medications Prior to Admission medications   Medication Sig Start Date End Date Taking? Authorizing Provider  albuterol (VENTOLIN HFA) 108 (90 Base) MCG/ACT inhaler Inhale 2 puffs into the lungs every 6 (six) hours as needed (for cough). 05/02/22   [name], MD  aspirin 325 MG EC tablet Take 325 mg by mouth daily. Reported on 12/01/2015    [provider]  Cholecalciferol (VITAMIN D3) 125 MCG (5000 UT) CAPS Take 1 capsule by mouth daily.    [provider]  estradiol (ESTRACE) 0.1 MG/GM vaginal cream Use a small amount daily as needed, typically 3-5 times per week, 1 tube/month.  Do not dispense med from [facility] 07/30/21   [name], MD  fluticasone [facility]) 50 MCG/ACT nasal spray 2 sprays in each nostril once a day 03/31/14   [name], MD  fluticasone-salmeterol [facility]) 250-50 MCG/ACT AEPB Inhale 1 puff into the lungs in the morning and at bedtime. 06/15/22   [name], MD  hydrochlorothiazide (HYDRODIURIL) 12.5 MG tablet Take 0.5 tablets (6.25 mg total) by mouth  daily. 06/15/22   [name], MD  lisinopril (ZESTRIL) 10 MG tablet Take 1 tablet (10 mg total) by mouth daily. 06/15/22   [name], MD  meloxicam (MOBIC) 7.5 MG tablet Take 1 tablet (7.5 mg total) by mouth 2 (two) times daily with a meal. 06/08/22   [name], MD  Multiple Vitamin (MULTIVITAMIN) tablet Take 1 tablet by mouth daily.    [provider]  nystatin cream (MYCOSTATIN) Apply 1 application topically 2 (two) times daily. 06/18/20   [name], MD  omeprazole (PRILOSEC OTC) 20 MG tablet Take 1 tablet (20 mg total) by mouth daily. 05/03/16   [name], MD  pravastatin (PRAVACHOL) 80 MG tablet Take 1 tablet (80 mg total) by mouth daily. 07/12/22   [name], MD  Probiotic Product ([place]) CAPS Take by mouth daily. Reported on 12/01/2015    [provider]      Allergies    Colchicine, Contrast media [iodinated contrast media], Voltaren [diclofenac], Aleve [naproxen], Ciprofloxacin, Codeine sulfate, Hydrocodone bit-homatrop mbr, Hydrocodone-acetaminophen, Niacin, Shellfish allergy, Tramadol, Voltaren [diclofenac sodium], Tape, and Thimerosal (thiomersal)    Review of Systems   Review of Systems  Physical Exam Updated Vital Signs BP (!) 162/79   Pulse (!) 119   Temp 97.8 F (36.6 C) (Rectal)   Resp 20   Ht 4\' 9"  (1.448 m)   Wt 73 kg  SpO2 100%   BMI 34.83 kg/m  Physical Exam Vitals and nursing note reviewed.  Constitutional:      General: She is in acute distress.     Appearance: She is well-developed. She is ill-appearing.     Comments: Obtunded. Withdraws to pain occasionally. Sonorous respirations.   HENT:     Head: Normocephalic.     Comments: Laceration to posterior scalp    Right Ear: External ear normal.     Left Ear: External ear normal.     Nose: Nose normal.  Eyes:     Extraocular Movements: Extraocular movements intact.     Conjunctiva/sclera: Conjunctivae normal.     Pupils: Pupils are equal,  round, and reactive to light.     Comments: Pupils 27mm BL  Neck:     Comments: C-collar in place Cardiovascular:     Rate and Rhythm: Regular rhythm. Tachycardia present.  Pulmonary:     Effort: Respiratory distress present.     Breath sounds: Normal breath sounds.     Comments: Non-rebreather in place Abdominal:     General: Abdomen is flat. There is no distension.     Palpations: Abdomen is soft. There is no mass.     Tenderness: There is no abdominal tenderness. There is no guarding.  Skin:    General: Skin is warm and dry.  Neurological:     Comments: See above  Psychiatric:        Mood and Affect: Mood normal.     ED Results / Procedures / Treatments   Labs (all labs ordered are listed, but only abnormal results are displayed) Labs Reviewed  CBC - Abnormal; Notable for the following components:      Result Value   WBC 18.4 (*)    MCV 100.2 (*)    MCHC 29.2 (*)    Platelets 466 (*)    All other components within normal limits  URINALYSIS, ROUTINE W REFLEX MICROSCOPIC - Abnormal; Notable for the following components:   APPearance HAZY (*)    Hgb urine dipstick SMALL (*)    Protein, ur 100 (*)    Bacteria, UA RARE (*)    All other components within normal limits  LACTIC ACID, PLASMA - Abnormal; Notable for the following components:   Lactic Acid, Venous >9.0 (*)    All other components within normal limits  CBG MONITORING, ED - Abnormal; Notable for the following components:   Glucose-Capillary 179 (*)    All other components within normal limits  I-STAT CHEM 8, ED - Abnormal; Notable for the following components:   BUN 24 (*)    Glucose, Bld 165 (*)    TCO2 14 (*)    All other components within normal limits  I-STAT ARTERIAL BLOOD GAS, ED - Abnormal; Notable for the following components:   pH, Arterial 7.086 (*)    pCO2 arterial 72.7 (*)    pO2, Arterial 323 (*)    Acid-base deficit 9.0 (*)    HCT 33.0 (*)    Hemoglobin 11.2 (*)    All other components  within normal limits  CBG MONITORING, ED - Abnormal; Notable for the following components:   Glucose-Capillary 139 (*)    All other components within normal limits  ETHANOL  PROTIME-INR  TRIGLYCERIDES  COMPREHENSIVE METABOLIC PANEL  MAGNESIUM  BLOOD GAS, ARTERIAL  CBC  MAGNESIUM  PHOSPHORUS  BASIC METABOLIC PANEL  SAMPLE TO BLOOD BANK  TROPONIN I (HIGH SENSITIVITY)  TROPONIN I (HIGH SENSITIVITY)  EKG EKG Interpretation  Date/Time:  Thursday September 01 2022 18:13:30 EDT Ventricular Rate:  143 PR Interval:    QRS Duration: 133 QT Interval:  355 QTC Calculation: 548 R Axis:   -64 Text Interpretation: Sinus tachycardia Right bundle branch block LVH with IVCD and secondary repol abnrm Inferior infarct, acute (RCA) Lateral leads are also involved Prolonged QT interval Probable RV involvement, suggest recording right precordial leads >>> Acute MI <<< Confirmed by Margaretmary Eddy [phone]) on 09/01/2022 6:15:20 PM  Radiology CT Head Wo Contrast  Result Date: 09/01/2022 CLINICAL DATA:  Provided history: Head trauma, abnormal mental status. Neck trauma. Seizure. istory of stroke and breast cancer. EXAM: CT HEAD WITHOUT CONTRAST CT CERVICAL SPINE WITHOUT CONTRAST TECHNIQUE: Multidetector CT imaging of the head and cervical spine was performed following the standard protocol without intravenous contrast. Multiplanar CT image reconstructions of the cervical spine were also generated. RADIATION DOSE REDUCTION: This exam was performed according to the departmental dose-optimization program which includes automated exposure control, adjustment of the mA and/or kV according to patient size and/or use of iterative reconstruction technique. COMPARISON:  Head CT 06/11/2003. Cervical spine CT 06/07/2003. FINDINGS: CT HEAD FINDINGS Brain: Mild generalized cerebral atrophy. Redemonstrated chronic cortical/subcortical left MCA territory infarcts within the left insula, left frontal lobe, left temporal lobe  and left parietal lobe. These infarcts have progressed in extent as compared to the prior head CT of 06/11/2003. Redemonstrated chronic lacunar infarcts within the bilateral deep gray nuclei. Background moderate patchy and ill-defined hypoattenuation within the cerebral white matter, nonspecific but compatible with chronic small vessel ischemic disease. These findings have progressed from the prior head CT. There is no acute intracranial hemorrhage. No acute demarcated cortical infarct. No extra-axial fluid collection. No evidence of an intracranial mass. No midline shift. Vascular: No hyperdense vessel. Atherosclerotic calcifications. Skull: No fracture or aggressive osseous lesion. Sinuses/Orbits: No mass or acute finding within the imaged orbits. Minimal mucosal thickening, and small-volume frothy secretions, within the right maxillary sinus. Minimal mucosal thickening within the left maxillary sinus. This may reflect odontogenic sinusitis as there is periapical lucency surrounding the left maxillary first molar tooth. Mild mucosal thickening versus small mucous retention cyst within the left sphenoid sinus. Trace mucosal thickening scattered within bilateral ethmoid air cells. Other: Left parietooccipital scalp hematoma. CT CERVICAL SPINE FINDINGS Alignment: Slight grade 1 retrolisthesis at C4-C5. Skull base and vertebrae: The basion-dental and atlanto-dental intervals are maintained.No evidence of acute fracture to the cervical spine. Soft tissues and spinal canal: No appreciable prevertebral soft tissue swelling. No visible canal hematoma. Disc levels: Cervical spondylosis with multilevel disc space narrowing, disc bulges and uncovertebral hypertrophy. No appreciable high-grade spinal canal stenosis. Uncovertebral hypertrophy results in left-sided bony neural foraminal narrowing at C4-C5. Upper chest: No visible pneumothorax. Irregular and ground-glass opacities within imaged portions of the left upper lobe,  incompletely characterized. Background interlobular septal thickening within imaged portions of the lung apices. Other: Partially imaged life-support tubes. No evidence of acute intracranial abnormality. No evidence of acute fracture to the cervical spine. These results were called by telephone at the time of interpretation on 09/01/2022 at 7:03 pm to provider Margaretmary Eddy , who verbally acknowledged these results. IMPRESSION: CT head: 1.  No evidence of an acute intracranial abnormality. 2. Left parietooccipital scalp hematoma. 3. Chronic cortically-based left MCA territory infarcts. 4. Background parenchymal atrophy, chronic small-vessel ischemic disease and chronic lacunar infarcts. 5. Mild generalized cerebral atrophy. 6. Paranasal sinus disease, as outlined. CT cervical spine: 1. No evidence of acute  fracture to the cervical spine. 2. Mild C4-C5 grade 1 retrolisthesis. 3. Cervical spondylosis. 4. Irregular and ground-glass opacities within imaged portions of the left upper lobe, incompletely characterized. Background interlobular septal thickening within imaged portions of the lung apices. Consider a dedicated chest CT for further characterization evaluation. Electronically Signed   By: Kellie Simmering D.O.   On: 09/01/2022 19:13   CT Cervical Spine Wo Contrast  Result Date: 09/01/2022 CLINICAL DATA:  Provided history: Head trauma, abnormal mental status. Neck trauma. Seizure. istory of stroke and breast cancer. EXAM: CT HEAD WITHOUT CONTRAST CT CERVICAL SPINE WITHOUT CONTRAST TECHNIQUE: Multidetector CT imaging of the head and cervical spine was performed following the standard protocol without intravenous contrast. Multiplanar CT image reconstructions of the cervical spine were also generated. RADIATION DOSE REDUCTION: This exam was performed according to the departmental dose-optimization program which includes automated exposure control, adjustment of the mA and/or kV according to patient size and/or use  of iterative reconstruction technique. COMPARISON:  Head CT 06/11/2003. Cervical spine CT 06/07/2003. FINDINGS: CT HEAD FINDINGS Brain: Mild generalized cerebral atrophy. Redemonstrated chronic cortical/subcortical left MCA territory infarcts within the left insula, left frontal lobe, left temporal lobe and left parietal lobe. These infarcts have progressed in extent as compared to the prior head CT of 06/11/2003. Redemonstrated chronic lacunar infarcts within the bilateral deep gray nuclei. Background moderate patchy and ill-defined hypoattenuation within the cerebral white matter, nonspecific but compatible with chronic small vessel ischemic disease. These findings have progressed from the prior head CT. There is no acute intracranial hemorrhage. No acute demarcated cortical infarct. No extra-axial fluid collection. No evidence of an intracranial mass. No midline shift. Vascular: No hyperdense vessel. Atherosclerotic calcifications. Skull: No fracture or aggressive osseous lesion. Sinuses/Orbits: No mass or acute finding within the imaged orbits. Minimal mucosal thickening, and small-volume frothy secretions, within the right maxillary sinus. Minimal mucosal thickening within the left maxillary sinus. This may reflect odontogenic sinusitis as there is periapical lucency surrounding the left maxillary first molar tooth. Mild mucosal thickening versus small mucous retention cyst within the left sphenoid sinus. Trace mucosal thickening scattered within bilateral ethmoid air cells. Other: Left parietooccipital scalp hematoma. CT CERVICAL SPINE FINDINGS Alignment: Slight grade 1 retrolisthesis at C4-C5. Skull base and vertebrae: The basion-dental and atlanto-dental intervals are maintained.No evidence of acute fracture to the cervical spine. Soft tissues and spinal canal: No appreciable prevertebral soft tissue swelling. No visible canal hematoma. Disc levels: Cervical spondylosis with multilevel disc space narrowing,  disc bulges and uncovertebral hypertrophy. No appreciable high-grade spinal canal stenosis. Uncovertebral hypertrophy results in left-sided bony neural foraminal narrowing at C4-C5. Upper chest: No visible pneumothorax. Irregular and ground-glass opacities within imaged portions of the left upper lobe, incompletely characterized. Background interlobular septal thickening within imaged portions of the lung apices. Other: Partially imaged life-support tubes. No evidence of acute intracranial abnormality. No evidence of acute fracture to the cervical spine. These results were called by telephone at the time of interpretation on 09/01/2022 at 7:03 pm to provider Margaretmary Eddy , who verbally acknowledged these results. IMPRESSION: CT head: 1.  No evidence of an acute intracranial abnormality. 2. Left parietooccipital scalp hematoma. 3. Chronic cortically-based left MCA territory infarcts. 4. Background parenchymal atrophy, chronic small-vessel ischemic disease and chronic lacunar infarcts. 5. Mild generalized cerebral atrophy. 6. Paranasal sinus disease, as outlined. CT cervical spine: 1. No evidence of acute fracture to the cervical spine. 2. Mild C4-C5 grade 1 retrolisthesis. 3. Cervical spondylosis. 4. Irregular and ground-glass opacities within  imaged portions of the left upper lobe, incompletely characterized. Background interlobular septal thickening within imaged portions of the lung apices. Consider a dedicated chest CT for further characterization evaluation. Electronically Signed   By: Kellie Simmering D.O.   On: 09/01/2022 19:13   DG Chest Port 1 View  Result Date: 09/01/2022 CLINICAL DATA:  Trauma EXAM: PORTABLE CHEST 1 VIEW COMPARISON:  CXR 05/02/22 FINDINGS: Endotracheal tube terminates approximately 3 cm above the carina. Enteric tube courses below diaphragm with the side hole and tip out of the field of view. No pleural effusion. No pneumothorax. There is widening of the mediastinal contours. Prominent  bilateral perihilar opacities, nonspecific and possibly related to bronchovascular crowding in the setting of low lung volumes. Surgical clips in the right upper quadrant. No radiographically apparent displaced rib fractures. IMPRESSION: 1. Endotracheal tube terminates approximately 3 cm above the carina. Enteric tube courses below diaphragm with the side hole and tip out of the field of view. 2. Low lung volumes with prominent perihilar opacities, nonspecific and possibly related to bronchovascular crowding in the setting of low lung volumes. Electronically Signed   By: Marin Roberts M.D.   On: 09/01/2022 18:37   DG Pelvis Portable  Result Date: 09/01/2022 CLINICAL DATA:  Trauma EXAM: PORTABLE PELVIS 1-2 VIEWS COMPARISON:  None Available. FINDINGS: There is no evidence of pelvic fracture or diastasis. No pelvic bone lesions are seen. IMPRESSION: Negative. Electronically Signed   By: Ronney Asters M.D.   On: 09/01/2022 18:34    Procedures Procedure Name: Intubation Date/Time: 09/01/2022 8:04 PM  Performed by: Fransico Meadow, MDPre-anesthesia Checklist: Patient identified, Emergency Drugs available, Suction available and Timeout performed Oxygen Delivery Method: Non-rebreather mask Preoxygenation: Pre-oxygenation with 100% oxygen Induction Type: IV induction and Rapid sequence Laryngoscope Size: Glidescope and 4 Grade View: Grade II Tube size: 6.0 mm Number of attempts: 2 Airway Equipment and Method: Rigid stylet and Video-laryngoscopy Placement Confirmation: ETT inserted through vocal cords under direct vision, Positive ETCO2 and CO2 detector Secured at: 23 cm Tube secured with: ETT holder Dental Injury: Teeth and Oropharynx as per pre-operative assessment  Difficulty Due To: Difficult Airway- due to anterior larynx Comments: Difficult airway due to anterior larynx with narrow vocal cord opening.  Initially tried with 7-0 ET tube and had to downsize to a 6-0 ET tube.  No other  complications during intubation.       Medications Ordered in ED Medications  fentaNYL 2523mcg in NS 236mL (65mcg/ml) infusion-PREMIX (50 mcg/hr Intravenous New Bag/Given 09/01/22 1807)  fentaNYL (SUBLIMAZE) bolus via infusion 25-100 mcg (100 mcg Intravenous Bolus from Bag 09/01/22 1811)  propofol (DIPRIVAN) 1000 MG/100ML infusion (40 mcg/kg/min  70 kg (Order-Specific) Intravenous New Bag/Given 09/01/22 1807)  etomidate (AMIDATE) injection (20 mg Intravenous Given 09/01/22 1801)  rocuronium (ZEMURON) injection (100 mg Intravenous Given 09/01/22 1802)  polyethylene glycol (MIRALAX / GLYCOLAX) packet 17 g (has no administration in time range)  enoxaparin (LOVENOX) injection 40 mg (has no administration in time range)  dextrose 5 %-0.45 % sodium chloride infusion (has no administration in time range)  ipratropium-albuterol (DUONEB) 0.5-2.5 (3) MG/3ML nebulizer solution 3 mL (has no administration in time range)  aspirin tablet 325 mg (has no administration in time range)  budesonide (PULMICORT) nebulizer solution 0.5 mg (has no administration in time range)  arformoterol (BROVANA) nebulizer solution 15 mcg (has no administration in time range)  docusate (COLACE) 50 MG/5ML liquid 100 mg (has no administration in time range)  insulin aspart (novoLOG) injection 0-15 Units (has  no administration in time range)  levETIRAcetam (KEPPRA) IVPB 1500 mg/ 100 mL premix (0 mg Intravenous Stopped 09/01/22 1843)  lactated ringers bolus 1,000 mL (1,000 mLs Intravenous New Bag/Given 09/01/22 1852)  Tdap (BOOSTRIX) injection 0.5 mL (0.5 mLs Intramuscular Given 09/01/22 1922)  levETIRAcetam (KEPPRA) IVPB 1500 mg/ 100 mL premix (1,500 mg Intravenous New Bag/Given 09/01/22 1952)  lactated ringers bolus 1,000 mL (1,000 mLs Intravenous New Bag/Given 09/01/22 2000)    ED Course/ Medical Decision Making/ A&P Clinical Course as of 09/01/22 2010  Thu Sep 01, 2022  [id number] Dr Luevenia Maxin from neuro [RP]  [phone] Dr Zenia Resides from  trauma surgery [RP]  [phone] Dr Duwayne Heck to admit.  [RP]    Clinical Course User Index [RP] Fransico Meadow, MD                            Medical Decision Making Amount and/or Complexity of Data Reviewed Labs: ordered. Radiology: ordered.  Risk Prescription drug management. Decision regarding hospitalization.   Katie [name] is a 77 y.o. female with comorbidities that complicate the patient evaluation including breast cancer in remission, hypertension, stroke on aspirin who presents to the emergency department with seizures and fall  Initial Ddx:  Seizures, status epilepticus, traumatic brain injury, cervical spine injury, alcohol withdrawal  MDM:  Feel the patient may have had seizures.  Unclear if she has a traumatic brain injury from falling that caused her to seize though.  Since she is persistently altered we will intubate her to facilitate her workup and to obtain CT scan and an expeditious manner.  She is requiring nonrebreather for hypoxia as well so would be concerned if we do not secure her airway at this time.  Seizing seems to have stopped with the Versed given by EMS so feel that status epilepticus less likely.  No significant alcohol abuse history.  Plan:  Labs CBG CT head and C-spine Intubation  ED Summary/Re-evaluation:  Patient was intubated in the emergency department.  Did have difficult airway.  See note for additional details.  Did become tachycardic and hypertensive afterwards so was given a bolus of fentanyl for sedation and pain.  Was started on propofol drip and was given a total of 3 g of Keppra for seizure prevention.  Taken to the CT scanner and head and C-spine CTs did not show any obvious injuries.  Patient was then discussed with neurology who recommended MRI.  Then discussed with ICU who will admit the patient.  Did talk to the patient's husband who denied any recent illnesses for the patient recently and states that she does not have any history of  seizures.  Did confirm code full code with the patient's husband.  This patient presents to the ED for concern of complaints listed in HPI, this involves an extensive number of treatment options, and is a complaint that carries with it a high risk of complications and morbidity. Disposition including potential need for admission considered.   Dispo: ICU  Additional history obtained from spouse Records reviewed Outpatient Clinic Notes The following labs were independently interpreted: Chemistry and show anion gap metabolic acidosis I independently reviewed the following imaging with scope of interpretation limited to determining acute life threatening conditions related to emergency care: CT Head and agree with the radiologist interpretation with the following exceptions: None I personally reviewed and interpreted cardiac monitoring: sinus tachycardia I personally reviewed and interpreted the pt's EKG: see above for interpretation  I  have reviewed the patients home medications and made adjustments as needed Consults: Critical care, Neurology, and Trauma Surgery Social Determinants of health:  Elderly  Final Clinical Impression(s) / ED Diagnoses Final diagnoses:  Seizure ([place])  Hypoxia  Respiratory distress  Injury of head, initial encounter    Rx / DC Orders ED Discharge Orders     None      CRITICAL CARE Performed by: Fransico Meadow   Total critical care time: 60 minutes  Critical care time was exclusive of separately billable procedures and treating other patients.  Critical care was necessary to treat or prevent imminent or life-threatening deterioration.  Critical care was time spent personally by me on the following activities: development of treatment plan with patient and/or surrogate as well as nursing, discussions with consultants, evaluation of patient's response to treatment, examination of patient, obtaining history from patient or surrogate, ordering and  performing treatments and interventions, ordering and review of laboratory studies, ordering and review of radiographic studies, pulse oximetry and re-evaluation of patient's condition.    Fransico Meadow, MD 09/01/22 2010

## 2022-09-01 NOTE — Progress Notes (Signed)
Orthopedic Tech Progress Note Patient Details:  Katie [name] January 13, 1946 [id number]  Level 1 trauma, ortho tech not needed at this time.  Patient ID: Katie [name], female   DOB: 1946-02-26, 77 y.o.   MRN: [id number]  Katie [name] 09/01/2022, 6:13 PM

## 2022-09-01 NOTE — Progress Notes (Signed)
Patient is headed to 4N in 15 minutes. There is also a current pending LTM order on that floor. Tech to hook patient up when she reaches floor.

## 2022-09-01 NOTE — ED Notes (Signed)
Pt came here by EMS after an unwitnessed fall at home in kitchen. Family heard the fall and witnessed a seizure activity. Only responsive to pain upon arrival in ED. Broken ceramic dish found under pt head. Not on blood thinners. EMS gave 5mg  Versed en route. No hx of seizures. Cardiac monitoring in place.

## 2022-09-01 NOTE — Progress Notes (Signed)
LTM EEG hooked up and running - no initial skin breakdown - push button tested - [facility] monitoring.  

## 2022-09-01 NOTE — Progress Notes (Signed)
Difficult airway during intubation. Anterior larynx, 6.0 placed after 2 attempts by MD with VL and cricoid pressure.

## 2022-09-01 NOTE — ED Triage Notes (Signed)
Unwitnessed fall in kitchen. Heard by family. Pt was observed having seizure activity for about 1 min by family. Pt head was laying on top of broken ceramic dish. Not on blood thinners. No hx of seizure. EMS gave 2.5mg  Versed. Level 1 trauma activated upon arrival.

## 2022-09-02 ENCOUNTER — Inpatient Hospital Stay (HOSPITAL_COMMUNITY): Payer: Medicare HMO

## 2022-09-02 DIAGNOSIS — R0902 Hypoxemia: Secondary | ICD-10-CM

## 2022-09-02 DIAGNOSIS — R569 Unspecified convulsions: Secondary | ICD-10-CM | POA: Diagnosis not present

## 2022-09-02 LAB — BASIC METABOLIC PANEL
Anion gap: 14 (ref 5–15)
BUN: 16 mg/dL (ref 8–23)
CO2: 19 mmol/L — ABNORMAL LOW (ref 22–32)
Calcium: 8.7 mg/dL — ABNORMAL LOW (ref 8.9–10.3)
Chloride: 102 mmol/L (ref 98–111)
Creatinine, Ser: 0.98 mg/dL (ref 0.44–1.00)
GFR, Estimated: >60 mL/min (ref >60)
Glucose, Bld: 117 mg/dL — ABNORMAL HIGH (ref 70–99)
Potassium: 4.3 mmol/L (ref 3.5–5.1)
Sodium: 135 mmol/L (ref 135–145)

## 2022-09-02 LAB — GLUCOSE, CAPILLARY
Glucose-Capillary: 101 mg/dL — ABNORMAL HIGH (ref 70–99)
Glucose-Capillary: 111 mg/dL — ABNORMAL HIGH (ref 70–99)
Glucose-Capillary: 118 mg/dL — ABNORMAL HIGH (ref 70–99)
Glucose-Capillary: 118 mg/dL — ABNORMAL HIGH (ref 70–99)
Glucose-Capillary: 119 mg/dL — ABNORMAL HIGH (ref 70–99)
Glucose-Capillary: 121 mg/dL — ABNORMAL HIGH (ref 70–99)
Glucose-Capillary: 122 mg/dL — ABNORMAL HIGH (ref 70–99)

## 2022-09-02 LAB — CBC
HCT: 40.4 % (ref 36.0–46.0)
Hemoglobin: 12.5 g/dL (ref 12.0–15.0)
MCH: 29.4 pg (ref 26.0–34.0)
MCHC: 30.9 g/dL (ref 30.0–36.0)
MCV: 95.1 fL (ref 80.0–100.0)
Platelets: 261 10*3/uL (ref 150–400)
RBC: 4.25 MIL/uL (ref 3.87–5.11)
RDW: 15 % (ref 11.5–15.5)
WBC: 14.2 10*3/uL — ABNORMAL HIGH (ref 4.0–10.5)
nRBC: 0 % (ref 0.0–0.2)

## 2022-09-02 LAB — SAMPLE TO BLOOD BANK

## 2022-09-02 LAB — PHOSPHORUS: Phosphorus: 4.1 mg/dL (ref 2.5–4.6)

## 2022-09-02 LAB — MAGNESIUM: Magnesium: 1.9 mg/dL (ref 1.7–2.4)

## 2022-09-02 LAB — TRIGLYCERIDES: Triglycerides: 494 mg/dL — ABNORMAL HIGH (ref <150)

## 2022-09-02 MED ORDER — PRAVASTATIN SODIUM 40 MG PO TABS
80.0000 mg | ORAL_TABLET | Freq: Every day | ORAL | Status: DC
  Administered 2022-09-03 – 2022-09-04 (×2): 80 mg via ORAL
  Filled 2022-09-02 (×2): qty 2

## 2022-09-02 MED ORDER — LEVETIRACETAM IN NACL 500 MG/100ML IV SOLN
500.0000 mg | Freq: Two times a day (BID) | INTRAVENOUS | Status: DC
  Administered 2022-09-02 – 2022-09-04 (×6): 500 mg via INTRAVENOUS
  Filled 2022-09-02 (×6): qty 100

## 2022-09-02 MED ORDER — ASPIRIN 300 MG RE SUPP: 300.0000 mg | Freq: Once | RECTAL | Status: DC

## 2022-09-02 MED ORDER — SODIUM CHLORIDE 0.9 % IV SOLN: 250.0000 mL | INTRAVENOUS | Status: DC

## 2022-09-02 MED ORDER — PANTOPRAZOLE SODIUM 40 MG PO TBEC
40.0000 mg | DELAYED_RELEASE_TABLET | Freq: Every day | ORAL | Status: DC
  Administered 2022-09-03 – 2022-09-05 (×3): 40 mg via ORAL
  Filled 2022-09-02 (×3): qty 1

## 2022-09-02 MED ORDER — NOREPINEPHRINE 4 MG/250ML-% IV SOLN
2.0000 ug/min | INTRAVENOUS | Status: DC
  Administered 2022-09-02: 2 ug/min via INTRAVENOUS
  Filled 2022-09-02: qty 250

## 2022-09-02 MED ORDER — ACETAMINOPHEN 325 MG PO TABS
650.0000 mg | ORAL_TABLET | ORAL | Status: DC | PRN
  Administered 2022-09-02 – 2022-09-05 (×6): 650 mg via ORAL
  Filled 2022-09-02 (×6): qty 2

## 2022-09-02 MED ORDER — LACTATED RINGERS IV SOLN: INTRAVENOUS | Status: DC

## 2022-09-02 MED ORDER — ASPIRIN 300 MG RE SUPP
300.0000 mg | Freq: Once | RECTAL | Status: AC
  Administered 2022-09-02: 300 mg via RECTAL
  Filled 2022-09-02: qty 1

## 2022-09-02 MED ORDER — MOMETASONE FURO-FORMOTEROL FUM 200-5 MCG/ACT IN AERO
2.0000 | INHALATION_SPRAY | Freq: Two times a day (BID) | RESPIRATORY_TRACT | Status: DC
  Administered 2022-09-02 – 2022-09-05 (×6): 2 via RESPIRATORY_TRACT
  Filled 2022-09-02: qty 8.8

## 2022-09-02 NOTE — Progress Notes (Signed)
Consulted to assess for ultrasound guided I.V. for pressor. Unable to find suitable vein. Veins found to be too small and at depth greater than 59mm. RN aware of assessment results.

## 2022-09-02 NOTE — Progress Notes (Signed)
Order ok to keep spare PIV in pt's Rt AC (remote mastectomy 28 yrs ago). If pt prefers, may also remove, but not necessary.

## 2022-09-02 NOTE — Procedures (Signed)
Patient Name: Katie [name]  MRN: [id number]  Epilepsy Attending: Lora Havens  Referring Physician/Provider: Rikki Spearing, NP  Duration: 09/01/2022 2137 to 09/02/2022 2137  Patient history: 77 y.o. female with PMH significant for GERD, HTN, HLD, prior cortical L MCA strokes on imaging, hx of breast cancer who had an unwitnessed fall in the kitchen and seizure. EEG to evaluate for seizure.  Level of alertness: lethargic /sedated  AEDs during EEG study: LEV, propofol  Technical aspects: This EEG study was done with scalp electrodes positioned according to the 10-20 International system of electrode placement. Electrical activity was reviewed with band pass filter of 1-70Hz , sensitivity of 7 uV/mm, display speed of 68mm/sec with a 60Hz  notched filter applied as appropriate. EEG data were recorded continuously and digitally stored.  Video monitoring was available and reviewed as appropriate.  Description: EEG initially showed continuous generalized 3 to 6 Hz theta-delta slowing. As sedation was weaned, posterior dominant rhythm of 8 Hz activity of moderate voltage (25-35 uV) was seen predominantly in posterior head regions, symmetric and reactive to eye opening and eye closing. EEG also showed intermittent 3-6hz  theta-delta slowing. Hyperventilation and photic stimulation were not performed.     ABNORMALITY - Continuous slow, generalized  IMPRESSION: This study was initially suggestive of severe diffuse encephalopathy, nonspecific etiology but could be related to sedation. As sedation was weaned, EEG improved and was suggestive of mild diffuse encephalopathy. No seizures or epileptiform discharges were seen throughout the recording.  [name]

## 2022-09-02 NOTE — Progress Notes (Signed)
Pt noted to have temp 100.65F and passed swallow screen. John w/ Elink informed for diet and prn med for temp.

## 2022-09-02 NOTE — Progress Notes (Signed)
Patient transported on ventilator to MRI and back to patient room without incident.
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
## 2022-09-02 NOTE — Procedures (Signed)
Extubation Procedure Note  Patient Details:   Name: ELIZBETH GAUTHIER DOB: [birth year] MRN: [id number]   Airway Documentation:    Vent end date: 09/02/22 Vent end time: 1018   Evaluation  O2 sats: stable throughout Complications: No apparent complications Patient did tolerate procedure well. Bilateral Breath Sounds: Rhonchi   Yes  Pt was extubated per MD order and placed on 2 L [place]. Cuff leak was noted prior to extubation and no stridor post. Pt is stable at this time. RT will monitor.   Ronaldo Miyamoto 09/02/2022, 10:18 AM

## 2022-09-02 NOTE — Progress Notes (Signed)
Pt able to move neck in all directions, does not report any pain. OK to D/C C Collar per Dr. Tacy Learn.

## 2022-09-02 NOTE — Progress Notes (Signed)
NEUROLOGY CONSULTATION PROGRESS NOTE   Date of service: September 02, 2022 Patient Name: Katie [name] MRN:  [id number] DOB:  09-Oct-1945  Brief HPI  Katie [name] is a 77 y.o. female with PMH significant for GERD, HTN, HLD, prior cortical L MCA strokes on imaging, hx of breast cancer who had an unwitnessed fall in the [place]. Family heard her fall down and found her seizing. Seizure lasted about a minute. EMS called, she got 2.5mg  of Versed and brought in to the ED as a level 1 trauma.    Interval Hx  3/28 admitted for seizures, started on Keppra  Saw patient 20 minutes after she was extubated today. She was still quite somnolent and could not fully participate in the interview. Endorsed pain in her hands (per husband from her prior carpal tunnel surgeries). Per husband her last stroke was in 2005 and since then she has made a full recovery with no residual deficits.  Vitals   Vitals:   09/02/22 0645 09/02/22 0700 09/02/22 0800 09/02/22 0812  BP: (!) 106/55 (!) 109/53 (!) 93/57 (!) 93/57  Pulse: 82 81 90 95  Resp: (!) 26 (!) 26 19 14   Temp:      TempSrc:      SpO2: 100% 100% 100% 100%  Weight:      Height:         Body mass index is 36.02 kg/m.  Physical Exam  General: in no acute distress HEENT: normocephalic Respiratory: non-labored breathing and on 2 L nasal cannula Extremities: moving all extremities spontaneously  Neurologic Examination  Mental Status: Katie [name] is somnolent; she is oriented to self, oriented to place, and oriented to other person/s, namely her husband who was present in room . Speech was clear without evidence of aphasia. She was able to follow only simple commands.  Cranial Nerves: II:  Visual fields  unable to be assessed due to poor cooperation ; pupils equal, round, reactive to light and accommodation III,IV, VI: no ptosis, extra-ocular motions intact bilaterally but with notable R sided exotropia V,VII: smile symmetric, facial light touch  sensation intact bilaterally VIII: hearing intact to voice IX,X: uvula rises symmetrically XI: shoulder symmetrically elevate bilaterally XII: midline tongue extension without atrophy and without fasciculations  Motor: Upper extremities: Right 3/5 full range of motion against gravity Left 2/5 full range of motion with gravity eliminated  Lower extremities: Right 3/5 full range of motion against gravity Left 3/5 full range of motion against gravity  Tone and bulk: normal tone throughout; no atrophy noted  Sensory: sensation to light touch intact throughout bilaterally  Cerebellar: Finger-to-nose test unable to participate, heel-to-shin test unable to participate  Gait: not observed during encounter  Labs   Basic Metabolic Panel:  Lab Results  Component Value Date   NA 135 09/02/2022   K 4.3 09/02/2022   CO2 19 (L) 09/02/2022   GLUCOSE 117 (H) 09/02/2022   BUN 16 09/02/2022   CREATININE 0.98 09/02/2022   CALCIUM 8.7 (L) 09/02/2022   GFRNONAA >60 09/02/2022   GFRAA 47 (L) 03/14/2018   HbA1c:  Lab Results  Component Value Date   HGBA1C 6.5 (A) 03/17/2022   LDL:  Lab Results  Component Value Date   LDLCALC 104 (H) 06/08/2018   Urine Drug Screen: No results found for: "LABOPIA", "COCAINSCRNUR", "LABBENZ", "AMPHETMU", "THCU", "LABBARB"  Alcohol Level     Component Value Date/Time   ETH <10 09/01/2022 1800   No results found for: "PHENYTOIN", "ZONISAMIDE", "LAMOTRIGINE", "LEVETIRACETA"  No results found for: "PHENYTOIN", "PHENOBARB", "VALPROATE", "CBMZ"  Imaging and Diagnostic studies  Results for orders placed during the hospital encounter of 09/01/22  CT Head Wo Contrast  Narrative CLINICAL DATA:  Provided history: Head trauma, abnormal mental status. Neck trauma. Seizure. istory of stroke and breast cancer.  EXAM: CT HEAD WITHOUT CONTRAST  CT CERVICAL SPINE WITHOUT CONTRAST  TECHNIQUE: Multidetector CT imaging of the head and cervical spine  was performed following the standard protocol without intravenous contrast. Multiplanar CT image reconstructions of the cervical spine were also generated.  RADIATION DOSE REDUCTION: This exam was performed according to the departmental dose-optimization program which includes automated exposure control, adjustment of the mA and/or kV according to patient size and/or use of iterative reconstruction technique.  COMPARISON:  Head CT 06/11/2003. Cervical spine CT 06/07/2003.  FINDINGS: CT HEAD FINDINGS  Brain:  Mild generalized cerebral atrophy.  Redemonstrated chronic cortical/subcortical left MCA territory infarcts within the left insula, left frontal lobe, left temporal lobe and left parietal lobe. These infarcts have progressed in extent as compared to the prior head CT of 06/11/2003.  Redemonstrated chronic lacunar infarcts within the bilateral deep gray nuclei.  Background moderate patchy and ill-defined hypoattenuation within the cerebral white matter, nonspecific but compatible with chronic small vessel ischemic disease. These findings have progressed from the prior head CT.  There is no acute intracranial hemorrhage.  No acute demarcated cortical infarct.  No extra-axial fluid collection.  No evidence of an intracranial mass.  No midline shift.  Vascular: No hyperdense vessel. Atherosclerotic calcifications.  Skull: No fracture or aggressive osseous lesion.  Sinuses/Orbits: No mass or acute finding within the imaged orbits. Minimal mucosal thickening, and small-volume frothy secretions, within the right maxillary sinus. Minimal mucosal thickening within the left maxillary sinus. This may reflect odontogenic sinusitis as there is periapical lucency surrounding the left maxillary first molar tooth. Mild mucosal thickening versus small mucous retention cyst within the left sphenoid sinus. Trace mucosal thickening scattered within bilateral ethmoid air  cells.  Other: Left parietooccipital scalp hematoma.  CT CERVICAL SPINE FINDINGS  Alignment: Slight grade 1 retrolisthesis at C4-C5.  Skull base and vertebrae: The basion-dental and atlanto-dental intervals are maintained.No evidence of acute fracture to the cervical spine.  Soft tissues and spinal canal: No appreciable prevertebral soft tissue swelling. No visible canal hematoma.  Disc levels: Cervical spondylosis with multilevel disc space narrowing, disc bulges and uncovertebral hypertrophy. No appreciable high-grade spinal canal stenosis. Uncovertebral hypertrophy results in left-sided bony neural foraminal narrowing at C4-C5.  Upper chest: No visible pneumothorax. Irregular and ground-glass opacities within imaged portions of the left upper lobe, incompletely characterized. Background interlobular septal thickening within imaged portions of the lung apices.  Other: Partially imaged life-support tubes.  No evidence of acute intracranial abnormality. No evidence of acute fracture to the cervical spine. These results were called by telephone at the time of interpretation on 09/01/2022 at 7:03 pm to provider Margaretmary Eddy , who verbally acknowledged these results.  IMPRESSION: CT head:  1.  No evidence of an acute intracranial abnormality. 2. Left parietooccipital scalp hematoma. 3. Chronic cortically-based left MCA territory infarcts. 4. Background parenchymal atrophy, chronic small-vessel ischemic disease and chronic lacunar infarcts. 5. Mild generalized cerebral atrophy. 6. Paranasal sinus disease, as outlined.  CT cervical spine:  1. No evidence of acute fracture to the cervical spine. 2. Mild C4-C5 grade 1 retrolisthesis. 3. Cervical spondylosis. 4. Irregular and ground-glass opacities within imaged portions of the left upper lobe, incompletely characterized. Background interlobular  septal thickening within imaged portions of the lung apices. Consider a  dedicated chest CT for further characterization evaluation.   Electronically Signed By: Kellie Simmering D.O. On: 09/01/2022 19:13   Impression  Katie [name] is a 77 y.o. female with PMH significant for GERD, HTN, HLD, prior cortical L MCA strokes on imaging, hx of breast cancer who had an unwitnessed fall in the [place]. Family heard her fall down and found her seizing. Seizure lasted about a minute. EMS called, she got 2.5mg  of Versed and brought in to the ED as a level 1 trauma.   Patient is still very drowsy and disoriented likely from coming off sedation. She has some generalized weakness but nothing focal. Exotropia of R eye has been present since childhood. No seizures recorded in overnight EEG.  Recommendations  - continue Keppra 500mg  BID - continue gradually weaning propofol until completely off. Keep LTM EEG on for several hours after the Propofol is completely weaned off and if no seizures, can be discontinued. - no driving for 6 months. Has to be seizure free before she can resume driving - PT/OT eval and treat, defer final dispo to primary team ______________________________________________________________________   Signed,  Gwynn Burly, MD, MPH [place] [facility] 09/02/2022 10:11 AM

## 2022-09-02 NOTE — Progress Notes (Signed)
vLTM maintenance  All impedances below 10kohm.  No skin breakdown noted at Foley T6 02 01

## 2022-09-03 DIAGNOSIS — S0990XA Unspecified injury of head, initial encounter: Secondary | ICD-10-CM

## 2022-09-03 DIAGNOSIS — R0603 Acute respiratory distress: Secondary | ICD-10-CM

## 2022-09-03 DIAGNOSIS — R0902 Hypoxemia: Secondary | ICD-10-CM | POA: Diagnosis not present

## 2022-09-03 DIAGNOSIS — R569 Unspecified convulsions: Secondary | ICD-10-CM | POA: Diagnosis not present

## 2022-09-03 LAB — BASIC METABOLIC PANEL
Anion gap: 10 (ref 5–15)
BUN: 10 mg/dL (ref 8–23)
CO2: 22 mmol/L (ref 22–32)
Calcium: 8.5 mg/dL — ABNORMAL LOW (ref 8.9–10.3)
Chloride: 104 mmol/L (ref 98–111)
Creatinine, Ser: 0.87 mg/dL (ref 0.44–1.00)
GFR, Estimated: >60 mL/min (ref >60)
Glucose, Bld: 111 mg/dL — ABNORMAL HIGH (ref 70–99)
Potassium: 3.4 mmol/L — ABNORMAL LOW (ref 3.5–5.1)
Sodium: 136 mmol/L (ref 135–145)

## 2022-09-03 LAB — GLUCOSE, CAPILLARY
Glucose-Capillary: 100 mg/dL — ABNORMAL HIGH (ref 70–99)
Glucose-Capillary: 104 mg/dL — ABNORMAL HIGH (ref 70–99)
Glucose-Capillary: 116 mg/dL — ABNORMAL HIGH (ref 70–99)
Glucose-Capillary: 124 mg/dL — ABNORMAL HIGH (ref 70–99)
Glucose-Capillary: 117 mg/dL — ABNORMAL HIGH (ref 70–99)

## 2022-09-03 LAB — CBC
HCT: 33.4 % — ABNORMAL LOW (ref 36.0–46.0)
Hemoglobin: 10.5 g/dL — ABNORMAL LOW (ref 12.0–15.0)
MCH: 29.5 pg (ref 26.0–34.0)
MCHC: 31.4 g/dL (ref 30.0–36.0)
MCV: 93.8 fL (ref 80.0–100.0)
Platelets: 251 10*3/uL (ref 150–400)
RBC: 3.56 MIL/uL — ABNORMAL LOW (ref 3.87–5.11)
RDW: 14.8 % (ref 11.5–15.5)
WBC: 9.8 10*3/uL (ref 4.0–10.5)
nRBC: 0 % (ref 0.0–0.2)

## 2022-09-03 LAB — LACTIC ACID, PLASMA: Lactic Acid, Venous: 0.7 mmol/L (ref 0.5–1.9)

## 2022-09-03 MED ORDER — INSULIN ASPART 100 UNIT/ML IJ SOLN
0.0000 [IU] | Freq: Three times a day (TID) | INTRAMUSCULAR | Status: DC
  Administered 2022-09-03 – 2022-09-05 (×4): 2 [IU] via SUBCUTANEOUS
  Administered 2022-09-05: 3 [IU] via SUBCUTANEOUS

## 2022-09-03 MED ORDER — DOCUSATE SODIUM 100 MG PO CAPS
100.0000 mg | ORAL_CAPSULE | Freq: Two times a day (BID) | ORAL | Status: DC | PRN
  Administered 2022-09-05: 100 mg via ORAL
  Filled 2022-09-03: qty 1

## 2022-09-03 MED ORDER — ORAL CARE MOUTH RINSE: 15.0000 mL | OROMUCOSAL | Status: DC | PRN

## 2022-09-03 MED ORDER — ASPIRIN 325 MG PO TABS
325.0000 mg | ORAL_TABLET | Freq: Every day | ORAL | Status: DC
  Administered 2022-09-04 – 2022-09-05 (×2): 325 mg via ORAL
  Filled 2022-09-03 (×2): qty 1

## 2022-09-03 MED ORDER — POLYETHYLENE GLYCOL 3350 17 G PO PACK: 17.0000 g | PACK | Freq: Every day | ORAL | Status: DC | PRN

## 2022-09-03 NOTE — Evaluation (Signed)
Physical Therapy Evaluation Patient Details Name: Katie [name] MRN: [id number] DOB: 1946-02-08 Today's Date: 09/03/2022  History of Present Illness  77 y.o. female was brought to [facility] ED 3/28 with seizures and fall. Intubated; extubated 3/29; CT head,  C-spine, and brain MRI were negative for acute process. PMH- breast cancer, DM, HTN, back pain, pulmonary nodules, CVA, osteopenia, gout  Clinical Impression   Pt admitted secondary to problem above with deficits below. PTA patient was independent with all mobility and driving. She lives with spouse, Curator, and friend in a home with 1 step to enter where she can live on the main floor.  Pt currently requires minguard assist for ambulation due to appears unsteady/antalgic reporting it hurts her feet to walk without her shoes (which are not here--pt to ask family to bring them in). She was also limited by HR increasing to 144 bpm and sats decr to 86% on room air. Sats slowly recovered to 90% after seated 3 minutes. Hopeful pt will do better with her shoes on and not need an assistive device or f/u PT. Anticipate patient will benefit from PT to address problems listed below.Will continue to follow acutely to maximize functional mobility independence and safety.          Recommendations for follow up therapy are one component of a multi-disciplinary discharge planning process, led by the attending physician.  Recommendations may be updated based on patient status, additional functional criteria and insurance authorization.  Follow Up Recommendations       Assistance Recommended at Discharge Set up Supervision/Assistance  Patient can return home with the following  A little help with bathing/dressing/bathroom;Assistance with cooking/housework;Assist for transportation    Equipment Recommendations  (possible need for cane; will assess when pt has her shoes)  Recommendations for Other Services       Functional Status Assessment Patient has  had a recent decline in their functional status and demonstrates the ability to make significant improvements in function in a reasonable and predictable amount of time.     Precautions / Restrictions Precautions Precautions: Fall;Other (comment) Precaution Comments: seizure; elevated HR      Mobility  Bed Mobility Overal bed mobility: Independent                  Transfers Overall transfer level: Independent Equipment used: None               General transfer comment: slightly impulsive as coming to standing despite PT requesting she wait for lines to be ready    Ambulation/Gait Ambulation/Gait assistance: Min guard Gait Distance (Feet): 50 Feet Assistive device: None Gait Pattern/deviations: Step-through pattern, Decreased stride length, Antalgic   Gait velocity interpretation: 1.31 - 2.62 ft/sec, indicative of limited community ambulator   General Gait Details: pt reports hurts to walk without her  shoes; asked her to have family bring in her shoes  Stairs            Wheelchair Mobility    Modified Rankin (Stroke Patients Only)       Balance Overall balance assessment: Mild deficits observed, not formally tested                                           Pertinent Vitals/Pain Pain Assessment Pain Assessment: No/denies pain    Home Living Family/patient expects to be discharged to:: Private residence Living Arrangements: Spouse/significant other;Non-relatives/Friends;Other (  Comment) (grandaughter)   Type of Home: House Home Access: Stairs to enter Entrance Stairs-Rails: None Entrance Stairs-Number of Steps: 1   Home Layout: Two level;Able to live on main level with bedroom/bathroom (sewing room upstairs) Home Equipment: Cane - single point;Shower seat      Prior Function Prior Level of Function : Independent/Modified Independent;Driving (reports she is the one who drives for her family; later states 77 yo grandaughter  does have her license, but works; husband drives but she is afraid to ride with him)                     Journalist, newspaper        Extremity/Trunk Assessment   Upper Extremity Assessment Upper Extremity Assessment: LUE deficits/detail (stitches s/p recent carpal tunnel surgery)    Lower Extremity Assessment Lower Extremity Assessment: Overall WFL for tasks assessed    Cervical / Trunk Assessment Cervical / Trunk Assessment: Normal  Communication   Communication: No difficulties  Cognition Arousal/Alertness: Awake/alert Behavior During Therapy: WFL for tasks assessed/performed Overall Cognitive Status: Within Functional Limits for tasks assessed                                 General Comments: not specifically tested beyond orientation        General Comments General comments (skin integrity, edema, etc.): HR rest 110; max HR 144; sats on RA decr 86% with slow recovery once seated and encouraged to purse lip breath. Up to 90% by end of session    Exercises     Assessment/Plan    PT Assessment Patient needs continued PT services  PT Problem List Decreased activity tolerance;Decreased mobility;Decreased balance;Decreased knowledge of use of DME;Cardiopulmonary status limiting activity       PT Treatment Interventions DME instruction;Gait training;Stair training;Functional mobility training;Therapeutic activities;Balance training;Patient/family education    PT Goals (Current goals can be found in the Care Plan section)  Acute Rehab PT Goals Patient Stated Goal: walk wihtout a device PT Goal Formulation: With patient Time For Goal Achievement: 09/17/22 Potential to Achieve Goals: Good    Frequency Min 3X/week     Co-evaluation               AM-PAC PT "6 Clicks" Mobility  Outcome Measure Help needed turning from your back to your side while in a flat bed without using bedrails?: None Help needed moving from lying on your back to sitting  on the side of a flat bed without using bedrails?: None Help needed moving to and from a bed to a chair (including a wheelchair)?: A Little Help needed standing up from a chair using your arms (e.g., wheelchair or bedside chair)?: A Little Help needed to walk in hospital room?: A Little Help needed climbing 3-5 steps with a railing? : A Little 6 Click Score: 20    End of Session Equipment Utilized During Treatment: Gait belt Activity Tolerance: Treatment limited secondary to medical complications (Comment) (elevated HR and decr sats) Patient left: in chair;with call bell/phone within reach;with chair alarm set;with family/visitor present Nurse Communication: Mobility status;Other (comment) (incr HR; decr sats) PT Visit Diagnosis: Unsteadiness on feet (R26.81)    Time: [id number] PT Time Calculation (min) (ACUTE ONLY): 27 min   Charges:   PT Evaluation $PT Eval Low Complexity: 1 Low PT Treatments $Gait Training: 8-22 mins         Arby Barrette, PT Acute Rehabilitation Services  Office [phone]   Rexanne Mano 09/03/2022, 1:53 PM

## 2022-09-03 NOTE — Progress Notes (Signed)
EEG maint complete. Wrap applied

## 2022-09-03 NOTE — Progress Notes (Signed)
NAME:  Katie [name], MRN:  [id number], DOB:  1946/05/01, LOS: 2 ADMISSION DATE:  09/01/2022, CONSULTATION DATE:  09/01/22 REFERRING MD:  Philip Aspen CHIEF COMPLAINT:  Seizure   History of Present Illness:  Katie [name] is a 77 y.o. female who has a PMH as below. She was brought to [facility] ED 3/28 with seizures. Family heard her fall while she was in the kitchen. When they went to kitchen, they found her on the ground and actively seizing. She had a broken dish under her head when found.  EMS was called and upon their arrival, seizure had resolved but she remained confused. She then had a 2nd seizure for which she was given 2.5mg  Versed. En route, she apparently came around and was intermittently oriented; however, she unfortunately suffered a 3rd seizure as EMS arrived in ED bay.  Upon arrival to ED, she was intubated for airway protection given GCS 3. Of note, she was a difficult intubation and upon chart review, her history also mentions difficulty with previous intubation with small ETT required.   CT head and C-spine were negative for acute process. She was evaluated by trauma given her fall, no additional recs at this point. Neurology has also seen the pt and has loaded her with Keppra.   She is currently sedated on Propofol and and Fentanyl. LTM has been ordered. She does not have any history of prior seizure. No known recent illness or reason to suspect as etiology of new seizure.   Pertinent  Medical History:  has CA IN SITU, BREAST; Diabetes mellitus without complication ([place]); Essential hypertension; GERD; Back pain; Hypercholesteremia; Pulmonary nodules; Medicare annual wellness visit, subsequent; CVA (cerebral infarction); History of UTI; Advance care planning; Asthma, mild; Acute maxillary sinusitis; Cough; Osteopenia; Gout; Vitamin D deficiency; Herpes zoster; Rash; Current use of estrogen therapy; Murmur; Joint pain; Seizure ([place]); and Hypoxia on their problem list.  Significant Hospital  Events: Including procedures, antibiotic start and stop dates in addition to other pertinent events   3/28 admit with seizure 3/29 Patient following commands, vented, EEG negative for seizures   Interim History / Subjective:  Patient was successfully extubated Currently awake, following commands No overnight issues Remain on continuous EEG  Objective:  Blood pressure 125/68, pulse (!) 104, temperature 98.8 F (37.1 C), temperature source Oral, resp. rate 18, height 4\' 9"  (1.448 m), weight 73.7 kg, SpO2 97 %.        Intake/Output Summary (Last 24 hours) at 09/03/2022 [id number] Last data filed at 09/03/2022 0900 Gross per 24 hour  Intake 1015.24 ml  Output 275 ml  Net 740.24 ml   Filed Weights   09/01/22 1846 09/02/22 0500 09/03/22 0448  Weight: 73 kg 75.5 kg 73.7 kg    Examination: Physical exam: General: Chronically ill-appearing female, lying on the bed HEENT: [place]/AT, eyes anicteric.  moist mucus membranes Neuro: Alert, awake following commands Chest: Coarse breath sounds, no wheezes or rhonchi Heart: Regular rate and rhythm, no murmurs or gallops Abdomen: Soft, nontender, nondistended, bowel sounds present Skin: No rash  Labs and images were reviewed  Assessment & Plan:  New onset seizure of unclear etiology Acute encephalopathy, likely due to postictal state So far workup has been negative including CT head, MRI brain and continuous EEG did not show active seizures Neurology is following, appreciate their recommendations Continue seizure precautions Continue to monitor for seizure activity  Continue Keppra and AED's per neuro recommendations  Acute hypoxic respiratory failure in setting of Status Epilepticus (has difficult  airway) Mild persistent asthma, not in exacerbation Patient was successfully extubated yesterday Titrate nasal cannula oxygen with O2 sats goal 92% Continue Budesonide/Brovana   Lactic Acidosis, due to seizure activity  Lactate trended  down Repeat lactate level is pending  DM 2, well-controlled Patient hemoglobin A1c 6.5 Continue sliding scale insulin CBG goal 140-180  Prior stroke, with no residual symptoms Continue aspirin and statin  HTN Patient blood pressure is controlled Hold home HCTZ, Lisinopril, resume when appropriate   HLD  Continue pravastatin  Breast CA in remission Continue to follow outpatient    Best practice (evaluated daily):  Diet/type: Regular diet DVT prophylaxis: LMWH GI prophylaxis: PPI Lines: N/A Foley:  N/A Code Status:  full code Last date of multidisciplinary goals of care discussion: 3/29: Patient's husband was updated at bedside, recommend continue full scope of care    Jacky Kindle, MD [place] [facility] See Amion for pager If no response to pager, please call [phone] until 7pm After 7pm, Please call E-link [phone]

## 2022-09-03 NOTE — Progress Notes (Signed)
LTM EEG discontinued - no skin breakdown at unhook.   

## 2022-09-03 NOTE — Progress Notes (Signed)
Neurology Progress Note   Subjective     The patient is seen further follow-up regarding new onset seizures 2/2 old left MCA stroke (2005) For additional details, please refer to the neurology consultation note this admission.  Active PL  Old left MCA territory infarct without significant residual deficit  Brain MRI with contrast shows no acute changes, but is noted with old left MCA infarction New onset seizures, likely partial with secondary generalization, await formal EEG report Seizure related fall at home with scalp hematoma History of right breast cancer Hypertension Diabetes mellitus type 2 Hyperlipidemia  Neurological Exam  Mental Status: is alert, awake and is oriented in all spheres. Follows simple and complex commands. CN II-XII: there are no gross lateralizing deficits. Motor: the anti-gravity strength is preserved throughout without pathological stretch reflexes or long tract motor signs. Sensory: is preserved throughout without focal extinction.  Coordination: station and gait is N/T in the inpatient setting. There is no focal dysmetria or gross truncal ataxia.  Objective Data   Brain MRI with contrast shows no acute changes, but is noted with old left MCA infarction Await EEG results  Assessment  New onset seizures, likely partial with secondary generalization, await formal EEG report  Plan  Continue present management with Keppra Strict fall and aspiration precautions throughout hospital stay Seizure precautions, including restrictions against driving a motor vehicle, operating fast-moving machinery or work at heights, until released Resume further follow-up, once formal EEG results are available  Thomasene Ripple, [place] [phone]  *This chart has been created using the EMR and [place]. Chart creation errors have been sought but may not always be located and corrected. However, such errors do NOT reflect on the  standard of medical care.

## 2022-09-03 NOTE — Procedures (Addendum)
Patient Name: Katie [name]  MRN: [id number]  Epilepsy Attending: Lora Havens  Referring Physician/Provider: Rikki Spearing, NP  Duration: 09/02/2022 2137 to 09/03/2022 [id number]   Patient history: 77 y.o. female with PMH significant for GERD, HTN, HLD, prior cortical L MCA strokes on imaging, hx of breast cancer who had an unwitnessed fall in the kitchen and seizure. EEG to evaluate for seizure.   Level of alertness: awake   AEDs during EEG study: LEV   Technical aspects: This EEG study was done with scalp electrodes positioned according to the 10-20 International system of electrode placement. Electrical activity was reviewed with band pass filter of 1-70Hz , sensitivity of 7 uV/mm, display speed of 34mm/sec with a 60Hz  notched filter applied as appropriate. EEG data were recorded continuously and digitally stored.  Video monitoring was available and reviewed as appropriate.   Description: The posterior dominant rhythm consists of 8 Hz activity of moderate voltage (25-35 uV) seen predominantly in posterior head regions, symmetric and reactive to eye opening and eye closing. Sleep was characterized by vertex waves, sleep spindles (12 to 14 Hz), maximal frontocentral region. EEG also showed intermittent 3-6hz  theta-delta slowing. Hyperventilation and photic stimulation were not performed.      ABNORMALITY - Continuous slow, generalized   IMPRESSION: This study was initially suggestive of severe diffuse encephalopathy, nonspecific etiology but could be related to sedation. As sedation was weaned, EEG improved and was suggestive of mild diffuse encephalopathy. No seizures or epileptiform discharges were seen throughout the recording.   Katie [name]

## 2022-09-04 ENCOUNTER — Inpatient Hospital Stay (HOSPITAL_COMMUNITY): Payer: Medicare HMO

## 2022-09-04 DIAGNOSIS — S0990XD Unspecified injury of head, subsequent encounter: Secondary | ICD-10-CM | POA: Diagnosis not present

## 2022-09-04 DIAGNOSIS — R0902 Hypoxemia: Secondary | ICD-10-CM | POA: Diagnosis not present

## 2022-09-04 DIAGNOSIS — R569 Unspecified convulsions: Secondary | ICD-10-CM | POA: Diagnosis not present

## 2022-09-04 DIAGNOSIS — R0603 Acute respiratory distress: Secondary | ICD-10-CM | POA: Diagnosis not present

## 2022-09-04 LAB — CBC WITH DIFFERENTIAL/PLATELET
Abs Immature Granulocytes: 0.03 10*3/uL (ref 0.00–0.07)
Basophils Absolute: 0 10*3/uL (ref 0.0–0.1)
Basophils Relative: 1 %
Eosinophils Absolute: 0.4 10*3/uL (ref 0.0–0.5)
Eosinophils Relative: 4 %
HCT: 33.1 % — ABNORMAL LOW (ref 36.0–46.0)
Hemoglobin: 10.8 g/dL — ABNORMAL LOW (ref 12.0–15.0)
Immature Granulocytes: 0 %
Lymphocytes Relative: 12 %
Lymphs Abs: 1 10*3/uL (ref 0.7–4.0)
MCH: 29.7 pg (ref 26.0–34.0)
MCHC: 32.6 g/dL (ref 30.0–36.0)
MCV: 90.9 fL (ref 80.0–100.0)
Monocytes Absolute: 0.5 10*3/uL (ref 0.1–1.0)
Monocytes Relative: 6 %
Neutro Abs: 6.3 10*3/uL (ref 1.7–7.7)
Neutrophils Relative %: 77 %
Platelets: 259 10*3/uL (ref 150–400)
RBC: 3.64 MIL/uL — ABNORMAL LOW (ref 3.87–5.11)
RDW: 14.6 % (ref 11.5–15.5)
WBC: 8.2 10*3/uL (ref 4.0–10.5)
nRBC: 0 % (ref 0.0–0.2)

## 2022-09-04 LAB — GLUCOSE, CAPILLARY
Glucose-Capillary: 108 mg/dL — ABNORMAL HIGH (ref 70–99)
Glucose-Capillary: 117 mg/dL — ABNORMAL HIGH (ref 70–99)
Glucose-Capillary: 133 mg/dL — ABNORMAL HIGH (ref 70–99)

## 2022-09-04 LAB — MAGNESIUM: Magnesium: 1.5 mg/dL — ABNORMAL LOW (ref 1.7–2.4)

## 2022-09-04 LAB — BASIC METABOLIC PANEL
Anion gap: 11 (ref 5–15)
BUN: 10 mg/dL (ref 8–23)
CO2: 24 mmol/L (ref 22–32)
Calcium: 8.8 mg/dL — ABNORMAL LOW (ref 8.9–10.3)
Chloride: 101 mmol/L (ref 98–111)
Creatinine, Ser: 0.89 mg/dL (ref 0.44–1.00)
GFR, Estimated: >60 mL/min (ref >60)
Glucose, Bld: 99 mg/dL (ref 70–99)
Potassium: 3.5 mmol/L (ref 3.5–5.1)
Sodium: 136 mmol/L (ref 135–145)

## 2022-09-04 MED ORDER — LEVETIRACETAM 500 MG PO TABS
500.0000 mg | ORAL_TABLET | Freq: Two times a day (BID) | ORAL | Status: DC
  Administered 2022-09-04 – 2022-09-05 (×2): 500 mg via ORAL
  Filled 2022-09-04 (×2): qty 1

## 2022-09-04 MED ORDER — LISINOPRIL 10 MG PO TABS
10.0000 mg | ORAL_TABLET | Freq: Every day | ORAL | Status: DC
  Administered 2022-09-04 – 2022-09-05 (×2): 10 mg via ORAL
  Filled 2022-09-04 (×2): qty 1

## 2022-09-04 MED ORDER — MAGNESIUM SULFATE 2 GM/50ML IV SOLN
2.0000 g | Freq: Once | INTRAVENOUS | Status: AC
  Administered 2022-09-04: 2 g via INTRAVENOUS
  Filled 2022-09-04: qty 50

## 2022-09-04 NOTE — Progress Notes (Signed)
SATURATION QUALIFICATIONS: (This note is used to comply with regulatory documentation for home oxygen)  Patient Saturations on Room Air at Rest = 92%  Patient Saturations on Room Air while Ambulating = 86%  Patient Saturations on 2 Liters of oxygen while Ambulating = 94%  Please briefly explain why patient needs home oxygen:  To maintain her oxygen saturation >87% during functional activities.    Arby Barrette, [place]  Office [phone]

## 2022-09-04 NOTE — Progress Notes (Signed)
Neurology Progress Note   Subjective     The patient is seen further follow-up regarding probable poststroke seizures.  For additional details, please refer to the neurology encounter notes this admission.  Active PL/pertinent findings  Seizure related fall at home with scalp hematoma Old left MCA infarction without residual clinical deficit History of breast cancer Essential hypertension Type 2 diabetes mellitus Hyperlipidemia  Physical Exam  VS BP (!) 141/74 (BP Location: Left Arm)   Pulse 88   Temp 98.8 F (37.1 C) (Oral)   Resp 15   Ht 4\' 9"  (1.448 m)   Wt 71.4 kg   SpO2 97%   BMI 34.06 kg/m   Gen: comfortable at rest, in no visible distress Head: normocephalic  Eyes: anicteric sclera ENT: moist mucous membranes with no exudate  Neurological Exam  Mental Status: is alert, awake and is oriented in all spheres. Follows simple and complex commands. CN II-XII: there are no gross lateralizing deficits. Motor: the anti-gravity strength is preserved throughout without pathological stretch reflexes or long tract motor signs. Sensory: is preserved throughout without focal extinction.  Coordination: station and gait is N/T in the inpatient setting. There is no focal dysmetria or gross truncal ataxia.  Objective Data   Brain MRI with contrast shows no acute changes but is noted with old left MCA infarction Overnight EEG with video monitoring, is reported to show mild encephalopathy without epileptiform discharges or electrographic seizures  Assessment   New onset seizures, likely partial with secondary generalization Seizure related fall with scalp hematoma Mild diffuse encephalopathy, gradually resolving  Plan  Continue present management with Keppra Strict fall and aspiration precautions throughout hospital stay Seizure precautions, including restrictions against driving a motor vehicle, operating fast-moving machinery or work at heights until released Supportive  care with PT and OT evaluation Disposition regarding discharge planning, per admitting attending physician  Thomasene Ripple, [place] [phone]  *This chart has been created using the EMR and [place]. Chart creation errors have been sought but may not always be located and corrected. However, such errors do NOT reflect on the standard of medical care.

## 2022-09-04 NOTE — Progress Notes (Addendum)
Physical Therapy Treatment and Discharge Patient Details Name: Katie [name] MRN: [id number] DOB: 08-23-1945 Today's Date: 09/04/2022   History of Present Illness 77 y.o. female was brought to [facility] ED 3/28 with seizures and fall. Intubated; extubated 3/29; CT head,  C-spine, and brain MRI were negative for acute process. PMH- breast cancer, DM, HTN, back pain, pulmonary nodules, CVA, osteopenia, gout    PT Comments    Patient had her shoes today and walked much better. (Very antalgic yesterday with only hospital socks/slippers). Continues with antalgic pattern, but no imbalance x 300 ft. Patient did require 2L O2 to maintain sats >87%. Issued an incentive spirometer and pt able to pull 500-750 ml. Educated to do 10 breaths per hour. No other skilled PT needs and pt is discharged from PT.    Recommendations for follow up therapy are one component of a multi-disciplinary discharge planning process, led by the attending physician.  Recommendations may be updated based on patient status, additional functional criteria and insurance authorization.  Follow Up Recommendations       Assistance Recommended at Discharge PRN  Patient can return home with the following Assistance with cooking/housework;Assist for transportation   Equipment Recommendations  None recommended by PT    Recommendations for Other Services       Precautions / Restrictions Precautions Precautions: Fall;Other (comment) Precaution Comments: seizure; elevated HR Restrictions Weight Bearing Restrictions: No     Mobility  Bed Mobility               General bed mobility comments: up in recliner    Transfers Overall transfer level: Independent Equipment used: None, Straight cane               General transfer comment: no issues with balance; pt did not want to use the cane, so repeated without device    Ambulation/Gait Ambulation/Gait assistance: Min guard Gait Distance (Feet): 300 Feet Assistive  device: None, Straight cane Gait Pattern/deviations: Step-through pattern, Decreased stride length, Antalgic   Gait velocity interpretation: >2.62 ft/sec, indicative of community ambulatory   General Gait Details: initial 5 ft used cane and then decided it was "in her way" ambulated without device with antalgic pattern (although improved with use of her shoes today)   Stairs             Wheelchair Mobility    Modified Rankin (Stroke Patients Only)       Balance Overall balance assessment: Mild deficits observed, not formally tested                                          Cognition Arousal/Alertness: Awake/alert Behavior During Therapy: WFL for tasks assessed/performed Overall Cognitive Status: Within Functional Limits for tasks assessed                                 General Comments: not specifically tested beyond orientation        Exercises      General Comments General comments (skin integrity, edema, etc.): HR max 118; sats on 2L 94% on 1L 87%      Pertinent Vitals/Pain Pain Assessment Pain Assessment: No/denies pain    Home Living Family/patient expects to be discharged to:: Private residence Living Arrangements: Spouse/significant other;Non-relatives/Friends;Other (Comment) (grandaughter)   Type of Home: House Home Access: Stairs to enter Entrance  Stairs-Rails: None Entrance Stairs-Number of Steps: 1   Home Layout: Two level;Able to live on main level with bedroom/bathroom (sewing room upstairs) Home Equipment: Cane - single point;Shower seat      Prior Function            PT Goals (current goals can now be found in the care plan section) Acute Rehab PT Goals Patient Stated Goal: walk wihtout a device PT Goal Formulation: With patient Time For Goal Achievement: 09/17/22 Potential to Achieve Goals: Good Progress towards PT goals: Goals met/education completed, patient discharged from PT     Frequency    Min 3X/week      PT Plan Discharge plan needs to be updated    Co-evaluation              AM-PAC PT "6 Clicks" Mobility   Outcome Measure  Help needed turning from your back to your side while in a flat bed without using bedrails?: None Help needed moving from lying on your back to sitting on the side of a flat bed without using bedrails?: None Help needed moving to and from a bed to a chair (including a wheelchair)?: None Help needed standing up from a chair using your arms (e.g., wheelchair or bedside chair)?: None Help needed to walk in hospital room?: None Help needed climbing 3-5 steps with a railing? : A Little 6 Click Score: 23    End of Session Equipment Utilized During Treatment: Gait belt Activity Tolerance: Patient tolerated treatment well Patient left: in chair;with call bell/phone within reach;with chair alarm set;with family/visitor present Nurse Communication: Mobility status;Other (comment) (pt has questions about her meds) PT Visit Diagnosis: Unsteadiness on feet (R26.81)     Time: [id number] PT Time Calculation (min) (ACUTE ONLY): 18 min  Charges:  $Gait Training: 8-22 mins                      [place]  Office [phone]    Rexanne Mano 09/04/2022, 2:56 PM

## 2022-09-04 NOTE — Progress Notes (Signed)
PROGRESS NOTE  Katie [name] [id number] DOB: June 26, 1945 DOA: 09/01/2022 PCP: [name], MD  HPI/Recap of past 24 hours: Katie [name] is a 77 y.o. female with a past medical history of breast cancer, diabetes mellitus, hypertension, GERD, CVA, was brought to [facility] ED 3/28 with seizures. Family found her to be seizing on the floor in the kitchen. EMS was called and upon their arrival, seizure had resolved but she remained confused, then had a 2nd seizure for which she was given 2.5mg  Versed. En route, she apparently came around and was intermittently oriented; however, she unfortunately had a 3rd seizure as EMS arrived in ED bay. Upon arrival to ED, PCCM admitted, intubated for airway protection given GCS 3. CT head and C-spine were negative for acute process. She was evaluated by trauma given her fall, no additional recs. Neurology consulted, patient admitted for further management.  All workup including MRI brain, LTM EEG have been negative for etiology of seizure.  [facility] assumed care on 09/04/2022.    Today, patient denies any new complaints.  Does have a history of carpal tunnel status post recent surgery by hand surgeon Dr. Amedeo Plenty.  Supposed to take off staples on 09/05/2022.  Patient also noted to be hypoxic while ambulating.  Denies any chest pain.  No further seizures noted   Assessment/Plan: Principal Problem:   Seizure ([place]) Active Problems:   Hypoxia   Head injury   Respiratory distress   New onset seizure of unclear etiology Workup has been negative including CT head, MRI brain and continuous EEG did not show active seizures Neurology following, appreciate their recommendations Continue Keppra Continue seizure/fall precautions   Acute hypoxic respiratory failure in setting of Status Epilepticus (has difficult airway) Mild persistent asthma, not in exacerbation Patient was intubated on 3/28 and extubated on 3/29, was a difficult intubation Repeat chest x-ray  pending Titrate nasal cannula oxygen with O2 sats goal 92% Continue Budesonide/Brovana   Hypomagnesemia Replace as needed   Lactic Acidosis, due to seizure activity  Lactate trended down to normal   DM 2, well-controlled Patient hemoglobin A1c 6.5 Continue sliding scale insulin   Prior stroke, with no residual symptoms Continue aspirin and statin   HTN Patient blood pressure is stable Hold home HCTZ, resume Lisinopril   HLD  Continue pravastatin   Breast CA in remission Continue to follow outpatient   Obesity Lifestyle modification advised     Estimated body mass index is 34.06 kg/m as calculated from the following:   Height as of this encounter: 4\' 9"  (1.448 m).   Weight as of this encounter: 71.4 kg.     Code Status: Full  Family Communication: None at bedside  Disposition Plan: Status is: Inpatient Remains inpatient appropriate because: Level of care      Consultants: PCCM Neurology  Procedures: LTM EEG   Antimicrobials: None  DVT prophylaxis: SCDs   Objective: Vitals:   09/04/22 1100 09/04/22 1158 09/04/22 1200 09/04/22 1610  BP: (!) 165/79  (!) 144/79 (!) 148/69  Pulse: 97  98 96  Resp: 17  17 14   Temp:  98.6 F (37 C) 98.6 F (37 C) 98.3 F (36.8 C)  TempSrc:  Oral Oral Oral  SpO2: 98%  100%   Weight:      Height:        Intake/Output Summary (Last 24 hours) at 09/04/2022 1700 Last data filed at 09/04/2022 1200 Gross per 24 hour  Intake 880.01 ml  Output 300 ml  Net 580.01 ml   Filed Weights   09/02/22 0500 09/03/22 0448 09/04/22 0600  Weight: 75.5 kg 73.7 kg 71.4 kg    Exam: General: NAD  Cardiovascular: S1, S2 present Respiratory: Diminished BS bilaterally Abdomen: Soft, nontender, nondistended, bowel sounds present Musculoskeletal: No bilateral pedal edema noted Skin: Normal Psychiatry: Normal mood    Data Reviewed: CBC: Recent Labs  Lab 09/01/22 1800 09/01/22 1840 09/01/22 1939 09/01/22 2212  09/02/22 0609 09/03/22 0458 09/04/22 0849  WBC 18.4*  --   --   --  14.2* 9.8 8.2  NEUTROABS  --   --   --   --   --   --  6.3  HGB 12.9   < > 11.2* 11.6* 12.5 10.5* 10.8*  HCT 44.2   < > 33.0* 34.0* 40.4 33.4* 33.1*  MCV 100.2*  --   --   --  95.1 93.8 90.9  PLT 466*  --   --   --  261 251 259   < > = values in this interval not displayed.   Basic Metabolic Panel: Recent Labs  Lab 09/01/22 1800 09/01/22 1840 09/01/22 1939 09/01/22 2212 09/02/22 0609 09/03/22 0950 09/04/22 0849  NA 139 139 136 134* 135 136 136  K 3.6 3.7 3.9 3.6 4.3 3.4* 3.5  CL 102 109  --   --  102 104 101  CO2 11*  --   --   --  19* 22 24  GLUCOSE 173* 165*  --   --  117* 111* 99  BUN 22 24*  --   --  16 10 10   CREATININE 1.27* 0.90  --   --  0.98 0.87 0.89  CALCIUM 9.3  --   --   --  8.7* 8.5* 8.8*  MG 2.1  --   --   --  1.9  --  1.5*  PHOS  --   --   --   --  4.1  --   --    GFR: Estimated Creatinine Clearance: 44.6 mL/min (by C-G formula based on SCr of 0.89 mg/dL). Liver Function Tests: Recent Labs  Lab 09/01/22 1800  AST 30  ALT 13  ALKPHOS 85  BILITOT 0.6  PROT 7.6  ALBUMIN 3.7   No results for input(s): "LIPASE", "AMYLASE" in the last 168 hours. No results for input(s): "AMMONIA" in the last 168 hours. Coagulation Profile: Recent Labs  Lab 09/01/22 1800  INR 1.1   Cardiac Enzymes: No results for input(s): "CKTOTAL", "CKMB", "CKMBINDEX", "TROPONINI" in the last 168 hours. BNP (last 3 results) No results for input(s): "PROBNP" in the last 8760 hours. HbA1C: No results for input(s): "HGBA1C" in the last 72 hours. CBG: Recent Labs  Lab 09/03/22 1622 09/03/22 2128 09/04/22 0751 09/04/22 1142 09/04/22 1619  GLUCAP 124* 117* 108* 117* 133*   Lipid Profile: Recent Labs    09/02/22 0609  TRIG 494*   Thyroid Function Tests: No results for input(s): "TSH", "T4TOTAL", "FREET4", "T3FREE", "THYROIDAB" in the last 72 hours. Anemia Panel: No results for input(s): "VITAMINB12",  "FOLATE", "FERRITIN", "TIBC", "IRON", "RETICCTPCT" in the last 72 hours. Urine analysis:    Component Value Date/Time   COLORURINE YELLOW 09/01/2022 1843   APPEARANCEUR HAZY (A) 09/01/2022 1843   LABSPEC 1.014 09/01/2022 1843   PHURINE 5.0 09/01/2022 1843   GLUCOSEU NEGATIVE 09/01/2022 1843   HGBUR SMALL (A) 09/01/2022 1843   HGBUR small 01/26/2009 [place] NEGATIVE 09/01/2022 1843   BILIRUBINUR neg 10/31/2019 1033  KETONESUR NEGATIVE 09/01/2022 1843   PROTEINUR 100 (A) 09/01/2022 1843   UROBILINOGEN 0.2 10/31/2019 1033   UROBILINOGEN 0.2 01/26/2009 1220   NITRITE NEGATIVE 09/01/2022 1843   LEUKOCYTESUR NEGATIVE 09/01/2022 1843   Sepsis Labs: @LABRCNTIP (procalcitonin:4,lacticidven:4)  ) Recent Results (from the past 240 hour(s))  MRSA Next Gen by PCR, Nasal     Status: None   Collection Time: 09/01/22  8:28 PM   Specimen: Nasal Mucosa; Nasal Swab  Result Value Ref Range Status   MRSA by PCR Next Gen NOT DETECTED NOT DETECTED Final    Comment: (NOTE) The GeneXpert MRSA Assay (FDA approved for NASAL specimens only), is one component of a comprehensive MRSA colonization surveillance program. It is not intended to diagnose MRSA infection nor to guide or monitor treatment for MRSA infections. Test performance is not FDA approved in patients less than 55 years old. Performed at [facility], [place] [address] [place], [place] [address]       Studies: No results found.  Scheduled Meds:  aspirin  325 mg Oral Daily   Chlorhexidine Gluconate Cloth  6 each Topical Daily   insulin aspart  0-15 Units Subcutaneous TID WC   levETIRAcetam  500 mg Oral BID   mometasone-formoterol  2 puff Inhalation BID   pantoprazole  40 mg Oral Daily   pravastatin  80 mg Oral q1800    Continuous Infusions:  sodium chloride       LOS: 3 days     Alma Friendly, MD [facility]  If 7PM-7AM, please contact night-coverage [email or web address] 09/04/2022, 5:00 PM

## 2022-09-05 ENCOUNTER — Inpatient Hospital Stay (HOSPITAL_COMMUNITY): Payer: Medicare HMO

## 2022-09-05 ENCOUNTER — Other Ambulatory Visit (HOSPITAL_COMMUNITY): Payer: Self-pay

## 2022-09-05 DIAGNOSIS — R569 Unspecified convulsions: Secondary | ICD-10-CM | POA: Diagnosis not present

## 2022-09-05 DIAGNOSIS — M25532 Pain in left wrist: Secondary | ICD-10-CM | POA: Diagnosis not present

## 2022-09-05 LAB — CBC WITH DIFFERENTIAL/PLATELET
Abs Immature Granulocytes: 0.07 10*3/uL (ref 0.00–0.07)
Basophils Absolute: 0 10*3/uL (ref 0.0–0.1)
Basophils Relative: 1 %
Eosinophils Absolute: 0.3 10*3/uL (ref 0.0–0.5)
Eosinophils Relative: 4 %
HCT: 32.9 % — ABNORMAL LOW (ref 36.0–46.0)
Hemoglobin: 10.7 g/dL — ABNORMAL LOW (ref 12.0–15.0)
Immature Granulocytes: 1 %
Lymphocytes Relative: 14 %
Lymphs Abs: 1.2 10*3/uL (ref 0.7–4.0)
MCH: 29.3 pg (ref 26.0–34.0)
MCHC: 32.5 g/dL (ref 30.0–36.0)
MCV: 90.1 fL (ref 80.0–100.0)
Monocytes Absolute: 0.7 10*3/uL (ref 0.1–1.0)
Monocytes Relative: 8 %
Neutro Abs: 6.2 10*3/uL (ref 1.7–7.7)
Neutrophils Relative %: 72 %
Platelets: 297 10*3/uL (ref 150–400)
RBC: 3.65 MIL/uL — ABNORMAL LOW (ref 3.87–5.11)
RDW: 14.4 % (ref 11.5–15.5)
WBC: 8.5 10*3/uL (ref 4.0–10.5)
nRBC: 0 % (ref 0.0–0.2)

## 2022-09-05 LAB — BASIC METABOLIC PANEL
Anion gap: 12 (ref 5–15)
BUN: 9 mg/dL (ref 8–23)
CO2: 27 mmol/L (ref 22–32)
Calcium: 9 mg/dL (ref 8.9–10.3)
Chloride: 98 mmol/L (ref 98–111)
Creatinine, Ser: 0.82 mg/dL (ref 0.44–1.00)
GFR, Estimated: >60 mL/min (ref >60)
Glucose, Bld: 118 mg/dL — ABNORMAL HIGH (ref 70–99)
Potassium: 3.2 mmol/L — ABNORMAL LOW (ref 3.5–5.1)
Sodium: 137 mmol/L (ref 135–145)

## 2022-09-05 LAB — GLUCOSE, CAPILLARY
Glucose-Capillary: 131 mg/dL — ABNORMAL HIGH (ref 70–99)
Glucose-Capillary: 129 mg/dL — ABNORMAL HIGH (ref 70–99)
Glucose-Capillary: 156 mg/dL — ABNORMAL HIGH (ref 70–99)

## 2022-09-05 LAB — MAGNESIUM: Magnesium: 1.8 mg/dL (ref 1.7–2.4)

## 2022-09-05 LAB — BRAIN NATRIURETIC PEPTIDE: B Natriuretic Peptide: 201.2 pg/mL — ABNORMAL HIGH (ref 0.0–100.0)

## 2022-09-05 MED ORDER — POTASSIUM CHLORIDE CRYS ER 20 MEQ PO TBCR
40.0000 meq | EXTENDED_RELEASE_TABLET | Freq: Once | ORAL | Status: AC
  Administered 2022-09-05: 40 meq via ORAL
  Filled 2022-09-05: qty 2

## 2022-09-05 MED ORDER — LEVETIRACETAM 500 MG PO TABS
500.0000 mg | ORAL_TABLET | Freq: Two times a day (BID) | ORAL | 0 refills | Status: DC
  Filled 2022-09-05: qty 60, 30d supply, fill #0

## 2022-09-05 NOTE — TOC Transition Note (Signed)
Transition of Care [facility]) - CM/SW Discharge Note   Patient Details  Name: Katie [name] MRN: [id number] Date of Birth: November 19, 1945  Transition of Care [facility]) CM/SW Contact:  Pollie Friar, RN Phone Number: 09/05/2022, 10:49 AM   Clinical Narrative:    Pt is from home with her spouse a friend that is staying with them and her granddaughter. She says someone is there most of the time. No DME at home.  She was driving self but unable for 6 months. She states someone else at the home can provide needed transportation. She manages her own medications and denies any issues. She uses [facility] on [address] as her primary pharmacy. Pt states her spouse will provide transportation home.   Final next level of care: Home/Self Care Barriers to Discharge: No Barriers Identified   Patient Goals and CMS Choice      Discharge Placement                         Discharge Plan and Services Additional resources added to the After Visit Summary for                                       Social Determinants of Health (SDOH) Interventions SDOH Screenings   Food Insecurity: No Food Insecurity (08/15/2022)  Housing: Low Risk  (08/15/2022)  Transportation Needs: No Transportation Needs (08/15/2022)  Utilities: Not At Risk (08/15/2022)  Alcohol Screen: Low Risk  (10/28/2020)  Depression (PHQ2-9): Low Risk  (08/15/2022)  Financial Resource Strain: Low Risk  (08/15/2022)  Recent Concern: Financial Resource Strain - Medium Risk (06/08/2022)  Physical Activity: Unknown (08/15/2022)  Social Connections: Unknown (08/15/2022)  Stress: Stress Concern Present (08/15/2022)  Tobacco Use: Low Risk  (08/15/2022)     Readmission Risk Interventions     No data to display

## 2022-09-05 NOTE — Care Management Important Message (Signed)
Important Message  Patient Details  Name: Katie [name] MRN: [id number] Date of Birth: 10-09-1945   Medicare Important Message Given:  Yes     Zaleigh Bermingham 09/05/2022, 3:20 PM

## 2022-09-05 NOTE — Progress Notes (Signed)
Neurology Progress Note   Brief HPI: Katie [name] is a 77 y.o. female with PMH significant for GERD, HTN, HLD, prior cortical L MCA strokes on imaging, hx of breast cancer who had an unwitnessed fall in the [place]. Family heard her fall down and found her seizing. Seizure lasted about a minute. EMS called, she got 2.5mg  of Versed and brought in to the ED as a level 1 trauma.   S:// Patient is awake, alert and oriented in NAD. No family at the bedside.  No new neurological events. Asking if she is able to go home   O:// Current vital signs: BP (!) 155/83 (BP Location: Left Arm)   Pulse 91   Temp 98.2 F (36.8 C) (Oral)   Resp 17   Ht 4\' 9"  (1.448 m)   Wt 71.4 kg   SpO2 94%   BMI 34.06 kg/m  Vital signs in last 24 hours: Temp:  [97.7 F (36.5 C)-98.8 F (37.1 C)] 98.2 F (36.8 C) (04/01 0835) Pulse Rate:  [88-108] 91 (04/01 0835) Resp:  [14-21] 17 (04/01 0835) BP: (136-165)/(69-112) 155/83 (04/01 0835) SpO2:  [89 %-100 %] 94 % (04/01 0835)  GENERAL: Awake, alert in NAD HEENT: - Normocephalic and atraumatic, dry mm LUNGS - Clear to auscultation bilaterally with no wheezes CV - S1S2 RRR, no m/r/g, equal pulses bilaterally. ABDOMEN - Soft, nontender, nondistended with normoactive BS Ext: warm, well perfused, intact peripheral pulses, no  edema Psych: normal mood and affect   NEURO:  Mental Status: AA&Ox3  Language: speech is clear.  Naming, repetition, fluency, and comprehension intact. Cranial Nerves: PERRL. Underlying strabisumus, visual fields full, no facial asymmetry, facial sensation intact, hearing intact, tongue/uvula/soft palate midline, normal sternocleidomastoid and trapezius muscle strength. No evidence of tongue atrophy or fibrillations Motor: 5/5 in all 4 extremities  Tone: is normal and bulk is normal Sensation- Intact to light touch bilaterally Coordination: FTN intact bilaterally, no ataxia in BLE. Gait- deferred   Medications  Current  Facility-Administered Medications:    0.9 %  sodium chloride infusion, 250 mL, Intravenous, Continuous, Ogan, Okoronkwo U, MD   acetaminophen (TYLENOL) tablet 650 mg, 650 mg, Oral, Q4H PRN, Frederik Pear, MD, 650 mg at 09/05/22 [id number]   aspirin tablet 325 mg, 325 mg, Oral, Daily, Chand, Sudham, MD, 325 mg at 09/05/22 0823   docusate sodium (COLACE) capsule 100 mg, 100 mg, Oral, BID PRN, Jacky Kindle, MD, 100 mg at 09/05/22 0824   insulin aspart (novoLOG) injection 0-15 Units, 0-15 Units, Subcutaneous, TID WC, Chand, Sudham, MD, 2 Units at 09/04/22 1658   ipratropium-albuterol (DUONEB) 0.5-2.5 (3) MG/3ML nebulizer solution 3 mL, 3 mL, Nebulization, Q6H PRN, Collier Bullock, MD   levETIRAcetam (KEPPRA) tablet 500 mg, 500 mg, Oral, BID, Luevenia Maxin, Babu, MD, 500 mg at 09/05/22 0824   lisinopril (ZESTRIL) tablet 10 mg, 10 mg, Oral, Daily, Alma Friendly, MD, 10 mg at 09/05/22 [id number]   mometasone-formoterol (DULERA) 200-5 MCG/ACT inhaler 2 puff, 2 puff, Inhalation, BID, Alma Friendly, MD, 2 puff at 09/04/22 1929   Oral care mouth rinse, 15 mL, Mouth Rinse, PRN, Tacy Learn, Sudham, MD   pantoprazole (PROTONIX) EC tablet 40 mg, 40 mg, Oral, Daily, Alma Friendly, MD, 40 mg at 09/05/22 0824   polyethylene glycol (MIRALAX / GLYCOLAX) packet 17 g, 17 g, Oral, Daily PRN, Jacky Kindle, MD   potassium chloride SA (KLOR-CON M) CR tablet 40 mEq, 40 mEq, Oral, Once, Alma Friendly, MD   pravastatin (PRAVACHOL) tablet 80  mg, 80 mg, Oral, q1800, Alma Friendly, MD, 80 mg at 09/04/22 1700 Labs CBC    Component Value Date/Time   WBC 8.5 09/05/2022 0449   RBC 3.65 (L) 09/05/2022 0449   HGB 10.7 (L) 09/05/2022 0449   HCT 32.9 (L) 09/05/2022 0449   PLT 297 09/05/2022 0449   MCV 90.1 09/05/2022 0449   MCH 29.3 09/05/2022 0449   MCHC 32.5 09/05/2022 0449   RDW 14.4 09/05/2022 0449   LYMPHSABS 1.2 09/05/2022 0449   MONOABS 0.7 09/05/2022 0449   EOSABS 0.3 09/05/2022 0449   BASOSABS  0.0 09/05/2022 0449    CMP     Component Value Date/Time   NA 137 09/05/2022 0449   K 3.2 (L) 09/05/2022 0449   CL 98 09/05/2022 0449   CO2 27 09/05/2022 0449   GLUCOSE 118 (H) 09/05/2022 0449   BUN 9 09/05/2022 0449   CREATININE 0.82 09/05/2022 0449   CALCIUM 9.0 09/05/2022 0449   PROT 7.6 09/01/2022 1800   ALBUMIN 3.7 09/01/2022 1800   AST 30 09/01/2022 1800   ALT 13 09/01/2022 1800   ALKPHOS 85 09/01/2022 1800   BILITOT 0.6 09/01/2022 1800   GFRNONAA >60 09/05/2022 0449   GFRAA 47 (L) 03/14/2018 1903     Lipid Panel     Component Value Date/Time   CHOL 214 (H) 07/23/2021 0834   TRIG 494 (H) 09/02/2022 0609   HDL 57.60 07/23/2021 0834   CHOLHDL 4 07/23/2021 0834   VLDL 71.8 (H) 07/23/2021 0834   LDLCALC 104 (H) 06/08/2018 0817   LDLDIRECT 104.0 07/23/2021 0834     Imaging I have reviewed images in epic and the results pertinent to this consultation are:  CT-scan of the brain no acute process   MRI examination of the brain- no acute process. Old L MCA infarct   EEG  mild encephalopathy without epileptiform discharges or electrographic seizures   Assessment:  Katie [name] is a 77 y.o. female with PMH significant for GERD, HTN, HLD, prior cortical L MCA strokes on imaging, hx of breast cancer who had an unwitnessed fall in the [place]. Family heard her fall down and found her seizing. Seizure lasted about a minute. EMS called, she got 2.5mg  of Versed   Impression: New onset seizures likely due to old left MCA infarct  Recommendations: - Seizure precautions  - Continue Keppra 500mg  BID  - Will need outpatient follow up with neurology in 1 month  - Neurology will sign off. Please call with questions or concerns   SEIZURE PRECAUTIONS Per [facility] statutes, patients with seizures are not allowed to drive until they have been seizure-free for six months.   Use caution when using heavy equipment or power tools. Avoid working on ladders or at heights.  Take showers instead of baths. Ensure the water temperature is not too high on the home water heater. Do not go swimming alone. Do not lock yourself in a room alone (i.e. bathroom). When caring for infants or small children, sit down when holding, feeding, or changing them to minimize risk of injury to the child in the event you have a seizure. Maintain good sleep hygiene. Avoid alcohol.    If patient has another seizure, call 911 and bring them back to the ED if: A.  The seizure lasts longer than 5 minutes.      B.  The patient doesn't wake shortly after the seizure or has new problems such as difficulty seeing, speaking or moving following  the seizure C.  The patient was injured during the seizure D.  The patient has a temperature over 102 F (39C) E.  The patient vomited during the seizure and now is having trouble breathing  Beulah Gandy DNP, ACNPC-AG  [facility]  I have seen the patient and reviewed the above note. She will need long term AED coverage given multiple seizures with old cortical infarct. I discussed that she is not allowed to drive by law for 6 months. With return to baseline, no further recommendations at this time.   Roland Rack, MD [facility] [phone]  If 7pm- 7am, please page neurology on call as listed in [place].

## 2022-09-05 NOTE — Discharge Summary (Signed)
Physician Discharge Summary   Patient: Katie [name] MRN: [id number] DOB: [birth year]  Admit date:     09/01/2022  Discharge date: 09/05/22  Discharge Physician: Alma Friendly   PCP: [name], MD   Recommendations at discharge:   Follow-up with PCP in 1 week Follow-up with neurology as scheduled  Discharge Diagnoses: Principal Problem:   Seizure Active Problems:   Hypoxia   Head injury   Respiratory distress    Hospital Course: Katie [name] is a 77 y.o. female with a past medical history of breast cancer, diabetes mellitus, hypertension, GERD, CVA, was brought to [facility] ED 3/28 with seizures. Family found her to be seizing on the floor in the kitchen. EMS was called and upon their arrival, seizure had resolved but she remained confused, then had a 2nd seizure for which she was given 2.5mg  Versed. En route, she apparently came around and was intermittently oriented; however, she unfortunately had a 3rd seizure as EMS arrived in ED bay. Upon arrival to ED, PCCM admitted, intubated for airway protection given GCS 3. CT head and C-spine were negative for acute process. She was evaluated by trauma given her fall, no additional recs. Neurology consulted, patient admitted for further management.  All workup including MRI brain, LTM EEG have been negative for etiology of seizure.  [facility] assumed care on 09/04/2022.     Today, patient reports feeling much better, denies any new complaints.  Patient was able to ambulate in the hallway, saturations remained above 90% on room air, although noted to be mildly tachycardic.  Patient reports this has been her baseline since she had a pneumonia and is currently being monitored.  Advised patient to follow-up with PCP in 1 week.  Patient had recent surgery by Dr. Phillip Heal make for carpal tunnel, had stitches removed on 4/1 by Hilbert Odor, PA for orthopedics.  Patient eager to be discharged.    Assessment and Plan:  New onset  seizure of unclear etiology Workup has been negative including CT head, MRI brain and continuous EEG did not show active seizures Neurology consulted, outpatient follow-up Discharged on Keppra No driving/operating any heavy machinery    Acute hypoxic respiratory failure in setting of Status Epilepticus (has difficult airway) Mild persistent asthma, not in exacerbation Patient was intubated on 3/28 and extubated on 3/29, was a difficult intubation Repeat chest x-ray unremarkable Continue home inhaler   Hypomagnesemia Replaced as needed   Lactic Acidosis, due to seizure activity  Lactate trended down to normal   DM 2, well-controlled Patient hemoglobin A1c 6.5 Continue home regimen   Prior stroke, with no residual symptoms Continue aspirin and statin   HTN Patient blood pressure is stable Continue home regimen   HLD  Continue pravastatin   Breast CA in remission Continue to follow outpatient    Obesity Lifestyle modification advised         Consultants: PCCM, neurology Procedures performed: Mechanical ventilation/intubation (difficult airway) Disposition: Home Diet recommendation:  Cardiac diet    DISCHARGE MEDICATION: Allergies as of 09/05/2022       Reactions   Colchicine Diarrhea, Nausea And Vomiting   Contrast Media [iodinated Contrast Media] Hives   hives   Voltaren [diclofenac] Rash   Aleve [naproxen]    Weight gain/finger swelling.  Can tolerate meloxicam.    Ciprofloxacin    REACTION: Swelling   Codeine Sulfate    REACTION: Itching   Hydrocodone Other (See Comments)   Hydrocodone Bit-homatrop Mbr    REACTION: Itching  Niacin    REACTION: Asthma attack   Shellfish Allergy    By test   Tramadol    Insomnia, lack of effect for pain   Tape Itching, Rash   Bandaids and large electrode pad used (on back) at hospital for cataract procedure   Thimerosal (thiomersal) Itching, Rash        Medication List     STOP taking these medications     fluticasone-salmeterol 250-50 MCG/ACT Aepb Commonly known as: Wixela Inhub       TAKE these medications    acetaminophen 650 MG CR tablet Commonly known as: TYLENOL Take 1,300 mg by mouth at bedtime.   albuterol 108 (90 Base) MCG/ACT inhaler Commonly known as: VENTOLIN HFA Inhale 2 puffs into the lungs every 6 (six) hours as needed (for cough).   aspirin EC 325 MG tablet Take 325 mg by mouth daily. Reported on 12/01/2015   estradiol 0.1 MG/GM vaginal cream Commonly known as: ESTRACE Use a small amount daily as needed, typically 3-5 times per week, 1 tube/month.  Do not dispense med from [place] changed:  how much to take how to take this when to take this additional instructions   fluticasone 50 MCG/ACT nasal spray Commonly known as: FLONASE 2 sprays in each nostril once a day What changed:  how much to take how to take this when to take this reasons to take this additional instructions   hydrochlorothiazide 12.5 MG tablet Commonly known as: HYDRODIURIL Take 0.5 tablets (6.25 mg total) by mouth daily.   levETIRAcetam 500 MG tablet Commonly known as: KEPPRA Take 1 tablet (500 mg total) by mouth 2 (two) times daily.   lisinopril 10 MG tablet Commonly known as: ZESTRIL Take 1 tablet (10 mg total) by mouth daily.   meloxicam 7.5 MG tablet Commonly known as: MOBIC Take 1 tablet (7.5 mg total) by mouth 2 (two) times daily with a meal.   multivitamin tablet Take 1 tablet by mouth daily.   nystatin cream Commonly known as: MYCOSTATIN Apply 1 application topically 2 (two) times daily. What changed:  when to take this reasons to take this   omeprazole 20 MG tablet Commonly known as: PRILOSEC OTC Take 1 tablet (20 mg total) by mouth daily.   [facility] Caps Take 1 capsule by mouth daily. Reported on 12/01/2015   pravastatin 80 MG tablet Commonly known as: PRAVACHOL Take 1 tablet (80 mg total) by mouth daily.   Symbicort 80-4.5 MCG/ACT  inhaler Generic drug: budesonide-formoterol Inhale 2 puffs into the lungs 2 (two) times daily.   Vitamin D3 125 MCG (5000 UT) capsule Generic drug: Cholecalciferol Take 1 capsule by mouth daily.        Follow-up Information     [name], MD. Schedule an appointment as soon as possible for a visit in 1 week(s).   Specialty: Family Medicine Contact information: [employer] [place] [address] [phone]                Discharge Exam: Katie [name]   09/02/22 0500 09/03/22 0448 09/04/22 0600  Weight: 75.5 kg 73.7 kg 71.4 kg   General: NAD  Cardiovascular: S1, S2 present Respiratory: CTAB Abdomen: Soft, nontender, nondistended, bowel sounds present Musculoskeletal: No bilateral pedal edema noted Skin: Normal Psychiatry: Normal mood   Condition at discharge: stable  The results of significant diagnostics from this hospitalization (including imaging, microbiology, ancillary and laboratory) are listed below for reference.   Imaging Studies: DG Wrist  Complete Left  Result Date: 09/05/2022 CLINICAL DATA:  Left wrist pain. EXAM: LEFT WRIST - COMPLETE 3+ VIEW COMPARISON:  None Available. FINDINGS: There is diffuse decreased bone mineralization. More focal lucency within the lateral aspect of the distal radial metaphysis is favored to be related to this decreased bone mineralization. No definite bone lesion is seen. Mild thumb carpometacarpal joint space narrowing and peripheral degenerative osteophytosis. There is a 5 mm well corticated lucent likely subchondral degenerative cyst at the distal, slightly medial aspect of the scaphoid at the triscaphe joint. 3 mm ulnar negative variance. IMPRESSION: 1. Mild thumb carpometacarpal and triscaphe osteoarthritis. 2. No acute fracture is seen. 3. 3 mm ulnar negative variance. Electronically Signed   By: Yvonne Kendall M.D.   On: 09/05/2022 15:23   DG CHEST PORT 1 VIEW  Result Date: 09/04/2022 CLINICAL DATA:   [id number] SOB (shortness of breath) [id number] EXAM: PORTABLE CHEST - 1 VIEW COMPARISON:  09/02/2022 FINDINGS: Patient has been extubated and the gastric tube removed. Lungs are clear. Heart size and mediastinal contours are within normal limits. No effusion. Visualized bones unremarkable.  Surgical clips right axilla. IMPRESSION: No acute cardiopulmonary disease. Electronically Signed   By: Lucrezia Europe M.D.   On: 09/04/2022 17:44   Overnight EEG with video  Result Date: 09/02/2022 Lora Havens, MD     09/03/2022  8:19 AM Patient Name: Katie [name] MRN: [id number] Epilepsy Attending: Lora Havens Referring Physician/Provider: Rikki Spearing, NP Duration: 09/01/2022 2137 to 09/02/2022 2137 Patient history: 77 y.o. female with PMH significant for GERD, HTN, HLD, prior cortical L MCA strokes on imaging, hx of breast cancer who had an unwitnessed fall in the kitchen and seizure. EEG to evaluate for seizure. Level of alertness: lethargic /sedated AEDs during EEG study: LEV, propofol Technical aspects: This EEG study was done with scalp electrodes positioned according to the 10-20 International system of electrode placement. Electrical activity was reviewed with band pass filter of 1-70Hz , sensitivity of 7 uV/mm, display speed of 26mm/sec with a 60Hz  notched filter applied as appropriate. EEG data were recorded continuously and digitally stored.  Video monitoring was available and reviewed as appropriate. Description: EEG initially showed continuous generalized 3 to 6 Hz theta-delta slowing. As sedation was weaned, posterior dominant rhythm of 8 Hz activity of moderate voltage (25-35 uV) was seen predominantly in posterior head regions, symmetric and reactive to eye opening and eye closing. EEG also showed intermittent 3-6hz  theta-delta slowing. Hyperventilation and photic stimulation were not performed.   ABNORMALITY - Continuous slow, generalized IMPRESSION: This study was initially suggestive of severe diffuse  encephalopathy, nonspecific etiology but could be related to sedation. As sedation was weaned, EEG improved and was suggestive of mild diffuse encephalopathy. No seizures or epileptiform discharges were seen throughout the recording. Lora Havens   DG Chest Port 1 View  Result Date: 09/02/2022 CLINICAL DATA:  Respiratory failure EXAM: PORTABLE CHEST 1 VIEW COMPARISON:  09/01/2022 FINDINGS: Endotracheal tube with tip at the clavicular heads. An enteric tube reaches the stomach at least. Cardiomegaly accentuated by rotation. Congested appearance of central vessels. Perihilar atelectasis is likely. No effusion or pneumothorax. IMPRESSION: 1. Unremarkable hardware. 2. Cardiomegaly and vascular congestion. Low volume chest with perihilar atelectasis. Electronically Signed   By: Jorje Guild M.D.   On: 09/02/2022 06:29   MR Brain W and Wo Contrast  Result Date: 09/02/2022 CLINICAL DATA:  New onset seizure.  Recent. EXAM: MRI HEAD WITHOUT AND WITH CONTRAST TECHNIQUE: Multiplanar, multiecho pulse sequences  of the brain and surrounding structures were obtained without and with intravenous contrast. CONTRAST:  7.96mL GADAVIST GADOBUTROL 1 MMOL/ML IV SOLN COMPARISON:  None Available. FINDINGS: Brain: No acute infarct, mass effect or extra-axial collection. No acute or chronic hemorrhage. There is multifocal hyperintense T2-weighted signal within the white matter. Generalized volume loss. Old left MCA territory infarct. Multiple old basal ganglia small vessel infarcts. The midline structures are normal. There is no abnormal contrast enhancement. Vascular: Major flow voids are preserved. Skull and upper cervical spine: Large left posterior predominant scalp hematoma. Sinuses/Orbits:No paranasal sinus fluid levels or advanced mucosal thickening. No mastoid or middle ear effusion. Normal orbits. IMPRESSION: 1. No acute intracranial abnormality. 2. Old left MCA territory infarct and findings of chronic small vessel  disease. 3. Large left posterior predominant scalp hematoma. Electronically Signed   By: Ulyses Jarred M.D.   On: 09/02/2022 00:14   CT Head Wo Contrast  Result Date: 09/01/2022 CLINICAL DATA:  Provided history: Head trauma, abnormal mental status. Neck trauma. Seizure. istory of stroke and breast cancer. EXAM: CT HEAD WITHOUT CONTRAST CT CERVICAL SPINE WITHOUT CONTRAST TECHNIQUE: Multidetector CT imaging of the head and cervical spine was performed following the standard protocol without intravenous contrast. Multiplanar CT image reconstructions of the cervical spine were also generated. RADIATION DOSE REDUCTION: This exam was performed according to the departmental dose-optimization program which includes automated exposure control, adjustment of the mA and/or kV according to patient size and/or use of iterative reconstruction technique. COMPARISON:  Head CT 06/11/2003. Cervical spine CT 06/07/2003. FINDINGS: CT HEAD FINDINGS Brain: Mild generalized cerebral atrophy. Redemonstrated chronic cortical/subcortical left MCA territory infarcts within the left insula, left frontal lobe, left temporal lobe and left parietal lobe. These infarcts have progressed in extent as compared to the prior head CT of 06/11/2003. Redemonstrated chronic lacunar infarcts within the bilateral deep gray nuclei. Background moderate patchy and ill-defined hypoattenuation within the cerebral white matter, nonspecific but compatible with chronic small vessel ischemic disease. These findings have progressed from the prior head CT. There is no acute intracranial hemorrhage. No acute demarcated cortical infarct. No extra-axial fluid collection. No evidence of an intracranial mass. No midline shift. Vascular: No hyperdense vessel. Atherosclerotic calcifications. Skull: No fracture or aggressive osseous lesion. Sinuses/Orbits: No mass or acute finding within the imaged orbits. Minimal mucosal thickening, and small-volume frothy secretions,  within the right maxillary sinus. Minimal mucosal thickening within the left maxillary sinus. This may reflect odontogenic sinusitis as there is periapical lucency surrounding the left maxillary first molar tooth. Mild mucosal thickening versus small mucous retention cyst within the left sphenoid sinus. Trace mucosal thickening scattered within bilateral ethmoid air cells. Other: Left parietooccipital scalp hematoma. CT CERVICAL SPINE FINDINGS Alignment: Slight grade 1 retrolisthesis at C4-C5. Skull base and vertebrae: The basion-dental and atlanto-dental intervals are maintained.No evidence of acute fracture to the cervical spine. Soft tissues and spinal canal: No appreciable prevertebral soft tissue swelling. No visible canal hematoma. Disc levels: Cervical spondylosis with multilevel disc space narrowing, disc bulges and uncovertebral hypertrophy. No appreciable high-grade spinal canal stenosis. Uncovertebral hypertrophy results in left-sided bony neural foraminal narrowing at C4-C5. Upper chest: No visible pneumothorax. Irregular and ground-glass opacities within imaged portions of the left upper lobe, incompletely characterized. Background interlobular septal thickening within imaged portions of the lung apices. Other: Partially imaged life-support tubes. No evidence of acute intracranial abnormality. No evidence of acute fracture to the cervical spine. These results were called by telephone at the time of interpretation on 09/01/2022 at  7:03 pm to provider Margaretmary Eddy , who verbally acknowledged these results. IMPRESSION: CT head: 1.  No evidence of an acute intracranial abnormality. 2. Left parietooccipital scalp hematoma. 3. Chronic cortically-based left MCA territory infarcts. 4. Background parenchymal atrophy, chronic small-vessel ischemic disease and chronic lacunar infarcts. 5. Mild generalized cerebral atrophy. 6. Paranasal sinus disease, as outlined. CT cervical spine: 1. No evidence of acute  fracture to the cervical spine. 2. Mild C4-C5 grade 1 retrolisthesis. 3. Cervical spondylosis. 4. Irregular and ground-glass opacities within imaged portions of the left upper lobe, incompletely characterized. Background interlobular septal thickening within imaged portions of the lung apices. Consider a dedicated chest CT for further characterization evaluation. Electronically Signed   By: Kellie Simmering D.O.   On: 09/01/2022 19:13   CT Cervical Spine Wo Contrast  Result Date: 09/01/2022 CLINICAL DATA:  Provided history: Head trauma, abnormal mental status. Neck trauma. Seizure. istory of stroke and breast cancer. EXAM: CT HEAD WITHOUT CONTRAST CT CERVICAL SPINE WITHOUT CONTRAST TECHNIQUE: Multidetector CT imaging of the head and cervical spine was performed following the standard protocol without intravenous contrast. Multiplanar CT image reconstructions of the cervical spine were also generated. RADIATION DOSE REDUCTION: This exam was performed according to the departmental dose-optimization program which includes automated exposure control, adjustment of the mA and/or kV according to patient size and/or use of iterative reconstruction technique. COMPARISON:  Head CT 06/11/2003. Cervical spine CT 06/07/2003. FINDINGS: CT HEAD FINDINGS Brain: Mild generalized cerebral atrophy. Redemonstrated chronic cortical/subcortical left MCA territory infarcts within the left insula, left frontal lobe, left temporal lobe and left parietal lobe. These infarcts have progressed in extent as compared to the prior head CT of 06/11/2003. Redemonstrated chronic lacunar infarcts within the bilateral deep gray nuclei. Background moderate patchy and ill-defined hypoattenuation within the cerebral white matter, nonspecific but compatible with chronic small vessel ischemic disease. These findings have progressed from the prior head CT. There is no acute intracranial hemorrhage. No acute demarcated cortical infarct. No extra-axial fluid  collection. No evidence of an intracranial mass. No midline shift. Vascular: No hyperdense vessel. Atherosclerotic calcifications. Skull: No fracture or aggressive osseous lesion. Sinuses/Orbits: No mass or acute finding within the imaged orbits. Minimal mucosal thickening, and small-volume frothy secretions, within the right maxillary sinus. Minimal mucosal thickening within the left maxillary sinus. This may reflect odontogenic sinusitis as there is periapical lucency surrounding the left maxillary first molar tooth. Mild mucosal thickening versus small mucous retention cyst within the left sphenoid sinus. Trace mucosal thickening scattered within bilateral ethmoid air cells. Other: Left parietooccipital scalp hematoma. CT CERVICAL SPINE FINDINGS Alignment: Slight grade 1 retrolisthesis at C4-C5. Skull base and vertebrae: The basion-dental and atlanto-dental intervals are maintained.No evidence of acute fracture to the cervical spine. Soft tissues and spinal canal: No appreciable prevertebral soft tissue swelling. No visible canal hematoma. Disc levels: Cervical spondylosis with multilevel disc space narrowing, disc bulges and uncovertebral hypertrophy. No appreciable high-grade spinal canal stenosis. Uncovertebral hypertrophy results in left-sided bony neural foraminal narrowing at C4-C5. Upper chest: No visible pneumothorax. Irregular and ground-glass opacities within imaged portions of the left upper lobe, incompletely characterized. Background interlobular septal thickening within imaged portions of the lung apices. Other: Partially imaged life-support tubes. No evidence of acute intracranial abnormality. No evidence of acute fracture to the cervical spine. These results were called by telephone at the time of interpretation on 09/01/2022 at 7:03 pm to provider Margaretmary Eddy , who verbally acknowledged these results. IMPRESSION: CT head: 1.  No evidence of  an acute intracranial abnormality. 2. Left  parietooccipital scalp hematoma. 3. Chronic cortically-based left MCA territory infarcts. 4. Background parenchymal atrophy, chronic small-vessel ischemic disease and chronic lacunar infarcts. 5. Mild generalized cerebral atrophy. 6. Paranasal sinus disease, as outlined. CT cervical spine: 1. No evidence of acute fracture to the cervical spine. 2. Mild C4-C5 grade 1 retrolisthesis. 3. Cervical spondylosis. 4. Irregular and ground-glass opacities within imaged portions of the left upper lobe, incompletely characterized. Background interlobular septal thickening within imaged portions of the lung apices. Consider a dedicated chest CT for further characterization evaluation. Electronically Signed   By: Kellie Simmering D.O.   On: 09/01/2022 19:13   DG Chest Port 1 View  Result Date: 09/01/2022 CLINICAL DATA:  Trauma EXAM: PORTABLE CHEST 1 VIEW COMPARISON:  CXR 05/02/22 FINDINGS: Endotracheal tube terminates approximately 3 cm above the carina. Enteric tube courses below diaphragm with the side hole and tip out of the field of view. No pleural effusion. No pneumothorax. There is widening of the mediastinal contours. Prominent bilateral perihilar opacities, nonspecific and possibly related to bronchovascular crowding in the setting of low lung volumes. Surgical clips in the right upper quadrant. No radiographically apparent displaced rib fractures. IMPRESSION: 1. Endotracheal tube terminates approximately 3 cm above the carina. Enteric tube courses below diaphragm with the side hole and tip out of the field of view. 2. Low lung volumes with prominent perihilar opacities, nonspecific and possibly related to bronchovascular crowding in the setting of low lung volumes. Electronically Signed   By: Marin Roberts M.D.   On: 09/01/2022 18:37   DG Pelvis Portable  Result Date: 09/01/2022 CLINICAL DATA:  Trauma EXAM: PORTABLE PELVIS 1-2 VIEWS COMPARISON:  None Available. FINDINGS: There is no evidence of pelvic fracture or  diastasis. No pelvic bone lesions are seen. IMPRESSION: Negative. Electronically Signed   By: Ronney Asters M.D.   On: 09/01/2022 18:34    Microbiology: Results for orders placed or performed during the hospital encounter of 09/01/22  MRSA Next Gen by PCR, Nasal     Status: None   Collection Time: 09/01/22  8:28 PM   Specimen: Nasal Mucosa; Nasal Swab  Result Value Ref Range Status   MRSA by PCR Next Gen NOT DETECTED NOT DETECTED Final    Comment: (NOTE) The GeneXpert MRSA Assay (FDA approved for NASAL specimens only), is one component of a comprehensive MRSA colonization surveillance program. It is not intended to diagnose MRSA infection nor to guide or monitor treatment for MRSA infections. Test performance is not FDA approved in patients less than 94 years old. Performed at [facility], [place] [address], [place], [place] [address]     Labs: CBC: Recent Labs  Lab 09/01/22 1800 09/01/22 1840 09/01/22 2212 09/02/22 0609 09/03/22 0458 09/04/22 0849 09/05/22 0449  WBC 18.4*  --   --  14.2* 9.8 8.2 8.5  NEUTROABS  --   --   --   --   --  6.3 6.2  HGB 12.9   < > 11.6* 12.5 10.5* 10.8* 10.7*  HCT 44.2   < > 34.0* 40.4 33.4* 33.1* 32.9*  MCV 100.2*  --   --  95.1 93.8 90.9 90.1  PLT 466*  --   --  261 251 259 297   < > = values in this interval not displayed.   Basic Metabolic Panel: Recent Labs  Lab 09/01/22 1800 09/01/22 1840 09/01/22 1939 09/01/22 2212 09/02/22 0609 09/03/22 0950 09/04/22 0849 09/05/22 0449  NA 139 139   < >  134* 135 136 136 137  K 3.6 3.7   < > 3.6 4.3 3.4* 3.5 3.2*  CL 102 109  --   --  102 104 101 98  CO2 11*  --   --   --  19* 22 24 27   GLUCOSE 173* 165*  --   --  117* 111* 99 118*  BUN 22 24*  --   --  16 10 10 9   CREATININE 1.27* 0.90  --   --  0.98 0.87 0.89 0.82  CALCIUM 9.3  --   --   --  8.7* 8.5* 8.8* 9.0  MG 2.1  --   --   --  1.9  --  1.5* 1.8  PHOS  --   --   --   --  4.1  --   --   --    < > = values in this interval not  displayed.   Liver Function Tests: Recent Labs  Lab 09/01/22 1800  AST 30  ALT 13  ALKPHOS 85  BILITOT 0.6  PROT 7.6  ALBUMIN 3.7   CBG: Recent Labs  Lab 09/04/22 1142 09/04/22 1619 09/05/22 0834 09/05/22 1205 09/05/22 1618  GLUCAP 117* 133* 131* 129* 156*    Discharge time spent: greater than 30 minutes.  Signed: Alma Friendly, MD [facility] 09/05/2022

## 2022-09-05 NOTE — Significant Event (Signed)
Patient ambulated in the halls on room air with RN, walked approximately 223feet, saturations during ambulation was 90-95%, HR 110-130 with ambulation.   Saturations 97%, HR 105 with sitting and resting.

## 2022-09-05 NOTE — Significant Event (Addendum)
Patient discharging to home. Taken to transportation by [facility]. Discharge was done by Lilia Pro, Musician. Patient received a copy of the AVS and her medications.

## 2022-09-06 ENCOUNTER — Ambulatory Visit: Payer: Self-pay

## 2022-09-06 ENCOUNTER — Telehealth: Payer: Self-pay | Admitting: *Deleted

## 2022-09-06 NOTE — Transitions of Care (Post Inpatient/ED Visit) (Signed)
   09/06/2022  Name: Katie [name] MRN: [id number] DOB: 1945-06-20  Today's TOC FU Call Status: Today's TOC FU Call Status:: Successful TOC FU Call Competed TOC FU Call Complete Date: 09/06/22  Transition Care Management Follow-up Telephone Call Date of Discharge: 09/05/22 Discharge Facility: [facility]) Type of Discharge: Inpatient Admission Primary Inpatient Discharge Diagnosis:: seizure How have you been since you were released from the hospital?: Better (I feel like I have been run over by a truck from the muscle activity of the seizures) Any questions or concerns?: Yes Patient Questions/Concerns:: How do I get the glue out of my hair from the adhesive pads Patient Questions/Concerns Addressed: Other: (RN explained different ways to ge the glue off hair)  Items Reviewed: Did you receive and understand the discharge instructions provided?: Yes Medications obtained and verified?: Yes (Medications Reviewed) Any new allergies since your discharge?: No Dietary orders reviewed?: No Do you have support at home?: Yes People in Home: spouse Name of Support/Comfort Primary Source: [facility] and Equipment/Supplies: [facility] Ordered?: No Any new equipment or medical supplies ordered?: No  Functional Questionnaire: Do you need assistance with bathing/showering or dressing?: No Do you need assistance with meal preparation?: No Do you need assistance with eating?: No Do you have difficulty maintaining continence: No Do you need assistance with getting out of bed/getting out of a chair/moving?: No Do you have difficulty managing or taking your medications?: No  Follow up appointments reviewed: PCP Follow-up appointment confirmed?: Yes Date of PCP follow-up appointment?: 09/16/22 Follow-up Provider: Dr Damita Dunnings  9:00 [facility] Follow-up appointment confirmed?: No Reason Specialist Follow-Up Not Confirmed: Patient has Specialist Provider Number and will  Call for Appointment Do you need transportation to your follow-up appointment?: No Do you understand care options if your condition(s) worsen?: Yes-patient verbalized understanding  SDOH Interventions Today    Flowsheet Row Most Recent Value  SDOH Interventions   Food Insecurity Interventions Intervention Not Indicated  Housing Interventions Intervention Not Indicated  Transportation Interventions Intervention Not Indicated      Interventions Today    Flowsheet Row Most Recent Value  General Interventions   General Interventions Discussed/Reviewed General Interventions Discussed, General Interventions Reviewed, Doctor Visits  Doctor Visits Discussed/Reviewed Specialist, Doctor Visits Reviewed, Doctor Visits Discussed  PCP/Specialist Visits Compliance with follow-up visit        [facility] [phone]

## 2022-09-06 NOTE — Chronic Care Management (AMB) (Signed)
   09/06/2022  Katie [name] 07-07-1945 [id number]   Reason for Encounter: Patient is not currently enrolled in the CCM program. CCM status changed to previously enrolled.   Horris Latino RN Care Manager/Chronic Care Management [phone]

## 2022-09-09 ENCOUNTER — Other Ambulatory Visit: Payer: Self-pay | Admitting: Family Medicine

## 2022-09-09 ENCOUNTER — Other Ambulatory Visit (INDEPENDENT_AMBULATORY_CARE_PROVIDER_SITE_OTHER): Payer: Medicare HMO

## 2022-09-09 DIAGNOSIS — E119 Type 2 diabetes mellitus without complications: Secondary | ICD-10-CM

## 2022-09-09 DIAGNOSIS — E559 Vitamin D deficiency, unspecified: Secondary | ICD-10-CM | POA: Diagnosis not present

## 2022-09-09 DIAGNOSIS — M109 Gout, unspecified: Secondary | ICD-10-CM

## 2022-09-09 LAB — COMPREHENSIVE METABOLIC PANEL
ALT: 11 U/L (ref 0–35)
AST: 18 U/L (ref 0–37)
Albumin: 3.9 g/dL (ref 3.5–5.2)
Alkaline Phosphatase: 65 U/L (ref 39–117)
BUN: 14 mg/dL (ref 6–23)
CO2: 26 mEq/L (ref 19–32)
Calcium: 9.1 mg/dL (ref 8.4–10.5)
Chloride: 103 mEq/L (ref 96–112)
Creatinine, Ser: 0.89 mg/dL (ref 0.40–1.20)
GFR: 63.3 mL/min (ref >60.00)
Glucose, Bld: 111 mg/dL — ABNORMAL HIGH (ref 70–99)
Potassium: 3.8 mEq/L (ref 3.5–5.1)
Sodium: 141 mEq/L (ref 135–145)
Total Bilirubin: 0.4 mg/dL (ref 0.2–1.2)
Total Protein: 6.6 g/dL (ref 6.0–8.3)

## 2022-09-09 LAB — CBC WITH DIFFERENTIAL/PLATELET
Basophils Absolute: 0.1 10*3/uL (ref 0.0–0.1)
Basophils Relative: 0.8 % (ref 0.0–3.0)
Eosinophils Absolute: 0.4 10*3/uL (ref 0.0–0.7)
Eosinophils Relative: 4.5 % (ref 0.0–5.0)
HCT: 35.4 % — ABNORMAL LOW (ref 36.0–46.0)
Hemoglobin: 11.8 g/dL — ABNORMAL LOW (ref 12.0–15.0)
Lymphocytes Relative: 19.2 % (ref 12.0–46.0)
Lymphs Abs: 1.6 10*3/uL (ref 0.7–4.0)
MCHC: 33.3 g/dL (ref 30.0–36.0)
MCV: 90.4 fl (ref 78.0–100.0)
Monocytes Absolute: 0.7 10*3/uL (ref 0.1–1.0)
Monocytes Relative: 8.3 % (ref 3.0–12.0)
Neutro Abs: 5.6 10*3/uL (ref 1.4–7.7)
Neutrophils Relative %: 67.2 % (ref 43.0–77.0)
Platelets: 391 10*3/uL (ref 150.0–400.0)
RBC: 3.91 Mil/uL (ref 3.87–5.11)
RDW: 15.5 % (ref 11.5–15.5)
WBC: 8.3 10*3/uL (ref 4.0–10.5)

## 2022-09-09 LAB — URIC ACID: Uric Acid, Serum: 7.8 mg/dL — ABNORMAL HIGH (ref 2.4–7.0)

## 2022-09-09 LAB — LIPID PANEL
Cholesterol: 158 mg/dL (ref 0–200)
HDL: 42.4 mg/dL (ref >39.00)
NonHDL: 115.59
Total CHOL/HDL Ratio: 4
Triglycerides: 322 mg/dL — ABNORMAL HIGH (ref 0.0–149.0)
VLDL: 64.4 mg/dL — ABNORMAL HIGH (ref 0.0–40.0)

## 2022-09-09 LAB — TSH: TSH: 2.59 u[IU]/mL (ref 0.35–5.50)

## 2022-09-09 LAB — MICROALBUMIN / CREATININE URINE RATIO
Creatinine,U: 87.9 mg/dL
Microalb Creat Ratio: 3 mg/g (ref 0.0–30.0)
Microalb, Ur: 2.6 mg/dL — ABNORMAL HIGH (ref 0.0–1.9)

## 2022-09-09 LAB — LDL CHOLESTEROL, DIRECT: Direct LDL: 61 mg/dL

## 2022-09-09 LAB — VITAMIN D 25 HYDROXY (VIT D DEFICIENCY, FRACTURES): VITD: 50 ng/mL (ref 30.00–100.00)

## 2022-09-09 LAB — HEMOGLOBIN A1C: Hgb A1c MFr Bld: 6.7 % — ABNORMAL HIGH (ref 4.6–6.5)

## 2022-09-16 ENCOUNTER — Ambulatory Visit (INDEPENDENT_AMBULATORY_CARE_PROVIDER_SITE_OTHER): Payer: Medicare HMO | Admitting: Family Medicine

## 2022-09-16 ENCOUNTER — Encounter: Payer: Self-pay | Admitting: Family Medicine

## 2022-09-16 VITALS — BP 124/78 | HR 87 | Temp 97.3°F | Ht <= 58 in | Wt 155.0 lb

## 2022-09-16 DIAGNOSIS — R569 Unspecified convulsions: Secondary | ICD-10-CM | POA: Diagnosis not present

## 2022-09-16 DIAGNOSIS — E119 Type 2 diabetes mellitus without complications: Secondary | ICD-10-CM | POA: Diagnosis not present

## 2022-09-16 DIAGNOSIS — D649 Anemia, unspecified: Secondary | ICD-10-CM | POA: Diagnosis not present

## 2022-09-16 DIAGNOSIS — Z9189 Other specified personal risk factors, not elsewhere classified: Secondary | ICD-10-CM

## 2022-09-16 DIAGNOSIS — Z0000 Encounter for general adult medical examination without abnormal findings: Secondary | ICD-10-CM

## 2022-09-16 DIAGNOSIS — I10 Essential (primary) hypertension: Secondary | ICD-10-CM

## 2022-09-16 DIAGNOSIS — Z7189 Other specified counseling: Secondary | ICD-10-CM

## 2022-09-16 MED ORDER — ESTRADIOL 0.1 MG/GM VA CREA: TOPICAL_CREAM | VAGINAL | 12 refills | Status: DC

## 2022-09-16 MED ORDER — HYDROCHLOROTHIAZIDE 12.5 MG PO TABS: 6.2500 mg | ORAL_TABLET | Freq: Every day | ORAL | Status: DC

## 2022-09-16 MED ORDER — LEVETIRACETAM 500 MG PO TABS: 500.0000 mg | ORAL_TABLET | Freq: Two times a day (BID) | ORAL | 1 refills | Status: DC

## 2022-09-16 NOTE — Progress Notes (Unsigned)
Recommendations at discharge:    Follow-up with PCP in 1 week Follow-up with neurology as scheduled   Discharge Diagnoses: Principal Problem:   Seizure Active Problems:   Hypoxia   Head injury   Respiratory distress       Hospital Course: Katie [name] is a 77 y.o. female with a past medical history of breast cancer, diabetes mellitus, hypertension, GERD, CVA, was brought to [facility] ED 3/28 with seizures. Family found her to be seizing on the floor in the kitchen. EMS was called and upon their arrival, seizure had resolved but she remained confused, then had a 2nd seizure for which she was given 2.5mg  Versed. En route, she apparently came around and was intermittently oriented; however, she unfortunately had a 3rd seizure as EMS arrived in ED bay. Upon arrival to ED, PCCM admitted, intubated for airway protection given GCS 3. CT head and C-spine were negative for acute process. She was evaluated by trauma given her fall, no additional recs. Neurology consulted, patient admitted for further management.  All workup including MRI brain, LTM EEG have been negative for etiology of seizure.  [facility] hospitalist assumed care on 09/04/2022.        Today, patient reports feeling much better, denies any new complaints.  Patient was able to ambulate in the hallway, saturations remained above 90% on room air, although noted to be mildly tachycardic.  Patient reports this has been her baseline since she had a pneumonia and is currently being monitored.  Advised patient to follow-up with PCP in 1 week.  Patient had recent surgery by Dr. Cheree Ditto make for carpal tunnel, had stitches removed on 4/1 by Earney Hamburg, PA for orthopedics.  Patient eager to be discharged.       Assessment and Plan:   New onset seizure of unclear etiology Workup has been negative including CT head, MRI brain and continuous EEG did not show active seizures Neurology consulted, outpatient follow-up Discharged on  Keppra No driving/operating any heavy machinery    Acute hypoxic respiratory failure in setting of Status Epilepticus (has difficult airway) Mild persistent asthma, not in exacerbation Patient was intubated on 3/28 and extubated on 3/29, was a difficult intubation Repeat chest x-ray unremarkable Continue home inhaler   Hypomagnesemia Replaced as needed   Lactic Acidosis, due to seizure activity  Lactate trended down to normal   DM 2, well-controlled Patient hemoglobin A1c 6.5 Continue home regimen   Prior stroke, with no residual symptoms Continue aspirin and statin   HTN Patient blood pressure is stable Continue home regimen   HLD  Continue pravastatin   Breast CA in remission Continue to follow outpatient    Obesity Lifestyle modification advised    ===================================  H/o CVA noted.  No clear trigger for event o/w.  Mood d/w pt, esp with keppra use and social upheaval.  No SI/HI.  Need to update neurology.  Temporary keppra refill sent.  No events in the meantime.    Diabetes:  No meds.   Hypoglycemic episodes: no Hyperglycemic episodes: no Feet problems: no Blood Sugars averaging: not checked often.   eye exam within last year: yes MALB positive.  Labs d/w pt.    She has positional sternal soreness at rest but not exertional sx.    Hypertension:    Using medication without problems or lightheadedness: yes Chest pain with exertion:no Edema:no Short of breath:no Has alternated 6.25-12.5mg  HCTZ depending on her BP.  Anemia.  Noted in the last few months.  Prev cologuard neg.  No known blood loss.    DXA deferred at this point.   Advance directive- husband designated if patient were incapacitated.  Then granddaughter Lelon Mast designated if needed.    Meds, vitals, and allergies reviewed.   ROS: Per HPI unless specifically indicated in ROS section   GEN: nad, alert and oriented HEENT: mucous membranes moist NECK: supple w/o  LA CV: rrr.  no murmur PULM: ctab, no inc wob ABD: soft, +bs EXT: no edema SKIN: no acute rash

## 2022-09-16 NOTE — Patient Instructions (Signed)
Recheck nonfasting labs when possible in the next few weeks.   Take care.  Glad to see you. Plan on recheck A1c in about 3 months.   Update me as needed in the meantime.

## 2022-09-18 DIAGNOSIS — Z0000 Encounter for general adult medical examination without abnormal findings: Secondary | ICD-10-CM | POA: Insufficient documentation

## 2022-09-18 DIAGNOSIS — D649 Anemia, unspecified: Secondary | ICD-10-CM | POA: Insufficient documentation

## 2022-09-18 NOTE — Assessment & Plan Note (Signed)
Discussed with patient that I would update neurology, temporary keppra refill sent.  I would prefer long-term prescription for Keppra to come through neurology.  No events in the meantime.    Continue Keppra for now.  Driving cautions discussed with patient.  She will follow-up with neurology.  Seizure-free in the meantime.

## 2022-09-18 NOTE — Assessment & Plan Note (Signed)
Continue work on diet and exercise.  We can recheck labs periodically.  Continue lisinopril for positive microalbumin.  She is already on pravastatin.

## 2022-09-18 NOTE — Assessment & Plan Note (Signed)
Continue hydrochlorothiazide.  She can increase from 6.25 up to 12.5 mg daily if her blood pressure requires it.  Continue lisinopril.

## 2022-09-18 NOTE — Assessment & Plan Note (Signed)
Noted in the last few months.  Prev cologuard neg.  No known blood loss.  Okay for outpatient follow-up.  Discussed rechecking labs in a few weeks.  See after visit summary.

## 2022-09-18 NOTE — Assessment & Plan Note (Signed)
Advance directive- husband designated if patient were incapacitated.  Then granddaughter Lelon Mast designated if needed.

## 2022-09-18 NOTE — Assessment & Plan Note (Signed)
DXA deferred at this point.   Advance directive- husband designated if patient were incapacitated.  Then granddaughter Lelon Mast designated if needed.

## 2022-09-30 ENCOUNTER — Other Ambulatory Visit (INDEPENDENT_AMBULATORY_CARE_PROVIDER_SITE_OTHER): Payer: Medicare HMO

## 2022-09-30 DIAGNOSIS — D649 Anemia, unspecified: Secondary | ICD-10-CM | POA: Diagnosis not present

## 2022-09-30 LAB — CBC WITH DIFFERENTIAL/PLATELET
Eosinophils Relative: 3.3 %
Lymphs Abs: 2357 cells/uL (ref 850–3900)
MCV: 89.4 fL (ref 80.0–100.0)
Neutro Abs: 5678 cells/uL (ref 1500–7800)
Neutrophils Relative %: 62.4 %
RBC: 3.95 10*6/uL (ref 3.80–5.10)
Total Lymphocyte: 25.9 %
WBC: 9.1 10*3/uL (ref 3.8–10.8)

## 2022-09-30 NOTE — Addendum Note (Signed)
Addended by: Chike Farrington P on: 09/30/2022 02:51 PM   Modules accepted: Orders  

## 2022-09-30 NOTE — Addendum Note (Signed)
Addended by: Naylah Cork P on: 09/30/2022 02:51 PM   Modules accepted: Orders  

## 2022-09-30 NOTE — Addendum Note (Signed)
Addended by: Eual Fines on: 09/30/2022 02:51 PM   Modules accepted: Orders

## 2022-09-30 NOTE — Addendum Note (Signed)
Addended by: Raygen Dahm P on: 09/30/2022 02:51 PM   Modules accepted: Orders  

## 2022-10-01 LAB — FERRITIN: Ferritin: 78 ng/mL (ref 16–288)

## 2022-10-01 LAB — CBC WITH DIFFERENTIAL/PLATELET
Absolute Monocytes: 701 cells/uL (ref 200–950)
Basophils Absolute: 64 cells/uL (ref 0–200)
Basophils Relative: 0.7 %
Eosinophils Absolute: 300 cells/uL (ref 15–500)
HCT: 35.3 % (ref 35.0–45.0)
Hemoglobin: 11.9 g/dL (ref 11.7–15.5)
MCH: 30.1 pg (ref 27.0–33.0)
MCHC: 33.7 g/dL (ref 32.0–36.0)
MPV: 10.2 fL (ref 7.5–12.5)
Monocytes Relative: 7.7 %
Platelets: 367 10*3/uL (ref 140–400)
RDW: 14.5 % (ref 11.0–15.0)

## 2022-10-01 LAB — VITAMIN B12: Vitamin B-12: 398 pg/mL (ref 200–1100)

## 2022-10-01 LAB — IRON: Iron: 59 ug/dL (ref 45–160)

## 2022-10-05 ENCOUNTER — Encounter: Payer: Self-pay | Admitting: Neurology

## 2022-10-05 ENCOUNTER — Ambulatory Visit: Payer: Medicare HMO | Admitting: Neurology

## 2022-10-05 VITALS — BP 120/78 | HR 85 | Ht <= 58 in | Wt 157.0 lb

## 2022-10-05 DIAGNOSIS — Z5181 Encounter for therapeutic drug level monitoring: Secondary | ICD-10-CM | POA: Diagnosis not present

## 2022-10-05 DIAGNOSIS — G40209 Localization-related (focal) (partial) symptomatic epilepsy and epileptic syndromes with complex partial seizures, not intractable, without status epilepticus: Secondary | ICD-10-CM

## 2022-10-05 MED ORDER — LAMOTRIGINE 25 MG PO TABS: ORAL_TABLET | ORAL | 0 refills | Status: DC

## 2022-10-05 MED ORDER — NAYZILAM 5 MG/0.1ML NA SOLN: 5.0000 mg | NASAL | 2 refills | Status: AC | PRN

## 2022-10-05 NOTE — Patient Instructions (Addendum)
Continue with Keppra 500 mg twice daily for now  We will switch patient from Keppra to Lamotrigine as follow  Week 1: 25mg  (one pill) at night  Week 2: 25mg  (one pill) twice daily Week 3: 50mg  (two pills) twice daily  Week 4: 75mg  (three pills) twice daily Week 5: 100mg  (fours pills) twice daily Will obtain a Lamotrigine level (blood test) and complete metabolic panel after week 6  Please stop the medication and call the office as soon as you develop a new rash Routine EEG  Nayzilam as rescue medications  Return in 6 months or sooner if worse
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
## 2022-10-11 ENCOUNTER — Encounter: Payer: Self-pay | Admitting: Family Medicine

## 2022-10-11 ENCOUNTER — Ambulatory Visit (INDEPENDENT_AMBULATORY_CARE_PROVIDER_SITE_OTHER): Payer: Medicare HMO | Admitting: Family Medicine

## 2022-10-11 ENCOUNTER — Ambulatory Visit (INDEPENDENT_AMBULATORY_CARE_PROVIDER_SITE_OTHER)
Admission: RE | Admit: 2022-10-11 | Discharge: 2022-10-11 | Disposition: A | Discharge status: Home / Self Care | Payer: Medicare HMO | Source: Ambulatory Visit | Attending: Family Medicine | Admitting: Family Medicine

## 2022-10-11 VITALS — BP 128/78 | HR 96 | Temp 97.9°F | Ht <= 58 in | Wt 155.0 lb

## 2022-10-11 DIAGNOSIS — J069 Acute upper respiratory infection, unspecified: Secondary | ICD-10-CM | POA: Diagnosis not present

## 2022-10-11 DIAGNOSIS — B301 Conjunctivitis due to adenovirus: Secondary | ICD-10-CM | POA: Diagnosis not present

## 2022-10-11 DIAGNOSIS — B309 Viral conjunctivitis, unspecified: Secondary | ICD-10-CM | POA: Diagnosis not present

## 2022-10-11 DIAGNOSIS — R059 Cough, unspecified: Secondary | ICD-10-CM | POA: Diagnosis not present

## 2022-10-11 DIAGNOSIS — H109 Unspecified conjunctivitis: Secondary | ICD-10-CM | POA: Insufficient documentation

## 2022-10-11 LAB — POC COVID19 BINAXNOW: SARS Coronavirus 2 Ag: NEGATIVE

## 2022-10-11 MED ORDER — BENZONATATE 200 MG PO CAPS: 200.0000 mg | ORAL_CAPSULE | Freq: Three times a day (TID) | ORAL | 1 refills | Status: DC | PRN
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
## 2022-10-11 NOTE — Assessment & Plan Note (Signed)
R sided  Most likely viral Px steroid drops from ophthy yesterday-has not started them  No vision change

## 2022-10-11 NOTE — Assessment & Plan Note (Signed)
Mostly cough with chest congestion /nasal symptoms are mild  2 weeks Cxr today clear/ no pna Sounds like some post viral cough syndrome Sent tessalon to pharmacy  Not wheezing/ declined prednisone (has cough variant asthma) but would consider if not improving Symptoms care discussed-see AVS  Continue symbicort and albuterol  ER precautions noted Update if not starting to improve in a week or if worsening

## 2022-10-11 NOTE — Patient Instructions (Addendum)
Drink lots of fluids and rest  If symptoms worsen please let us know  If severe (short of breath) - go to the ER  Try tessalon for cough as needed  Update if not starting to improve in a week or if worsening    Chest xray today  We will call with result    Take care of yourself

## 2022-10-17 ENCOUNTER — Telehealth: Payer: Self-pay | Admitting: Family Medicine

## 2022-10-17 MED ORDER — AMOXICILLIN-POT CLAVULANATE 875-125 MG PO TABS: 1.0000 | ORAL_TABLET | Freq: Two times a day (BID) | ORAL | 0 refills | Status: DC

## 2022-10-17 NOTE — Telephone Encounter (Signed)
Patient called in and stated that she seen Dr. Milinda Antis last week. She stated that her eyes are still gummy, she is still coughing and she is blowing yellow/greenish stuff out of her nose. She was wanting to know if something could be sent in for her. Please advise. Thank you!

## 2022-10-17 NOTE — Addendum Note (Signed)
Addended by: Roxy Manns A on: 10/17/2022 04:35 PM   Modules accepted: Orders

## 2022-10-17 NOTE — Telephone Encounter (Signed)
Thanks for letting me know  Any sinus pain?, pressure? Fever? Is cough productive? Any wheezing or sob ?  Any new symptoms  Will cc to pcp also as fyi

## 2022-10-17 NOTE — Telephone Encounter (Signed)
I sent in augmentin to take as directed for sinus infection If wheezing worsens let us know   Update if not starting to improve in a week or if worsening

## 2022-10-17 NOTE — Telephone Encounter (Signed)
Pt notified Rx sent to pharmacy and advised of Dr. Tower's comments  

## 2022-10-17 NOTE — Telephone Encounter (Signed)
  Any sinus pain?  Pt said she has been dealing with headaches   pressure? Pt said she has a lot of facial pressure when blowing her nose Fever? No fever Is cough productive? No coughing up anything it's all coming out of her nose. (Green/yellow/bloody) Any wheezing or sob? Yes she is wheezing. But she said she is hesitant to start prednisone because she has a EEG schedule on Thursday.   [facility] [address]

## 2022-10-18 NOTE — Telephone Encounter (Signed)
Thanks

## 2022-10-20 ENCOUNTER — Ambulatory Visit: Payer: Medicare HMO | Admitting: Neurology

## 2022-10-20 DIAGNOSIS — G40209 Localization-related (focal) (partial) symptomatic epilepsy and epileptic syndromes with complex partial seizures, not intractable, without status epilepticus: Secondary | ICD-10-CM | POA: Diagnosis not present

## 2022-10-21 NOTE — Procedures (Signed)
    History:  77 year old woman with epilepsy   EEG classification:  Awake and asleep  Description of the recording: The background rhythms of this recording consists of a fairly well modulated medium amplitude background activity of 8 Hz. As the record progresses, the patient initially is in the waking state, but appears to enter the early stage II sleep during the recording, with rudimentary sleep spindles and vertex sharp wave activity seen. During the wakeful state, photic stimulation is performed, and no abnormal responses were seen. Hyperventilation was also performed, no abnormal response seen. No epileptiform discharges seen during this recording. There was intermittent left temporal focal slowing.   Abnormality: Intermittent left temporal slowing   Impression: This is an abnormal EEG recording in the waking and sleeping state due to presence of intermittent left temporal slowing. This is consistent with an area neuronal dysfunction in the left temporal region.   Windell Norfolk, MD [facility]

## 2022-10-25 ENCOUNTER — Other Ambulatory Visit: Payer: Self-pay | Admitting: Family Medicine

## 2022-10-25 MED ORDER — BENZONATATE 200 MG PO CAPS: 200.0000 mg | ORAL_CAPSULE | Freq: Three times a day (TID) | ORAL | 1 refills | Status: DC | PRN

## 2022-10-26 ENCOUNTER — Telehealth: Payer: Self-pay | Admitting: Family Medicine

## 2022-10-26 NOTE — Telephone Encounter (Signed)
Late entry.  Discussed with patient about her situation when she was present at her husband's visit yesterday.  She had a chest x-ray ~2 weeks ago, reviewed.  She has had nighttime wheeze.  Cough is not significantly improved.  She is done with Augmentin now.  She has mild decrease in symptoms with albuterol use.  She still using Symbicort.  She does have some sputum but is less discolored now.  I listen to her lungs and she was clear to auscultation bilaterally without any wheeze.  No focal decrease in breath sounds.  I sent a prescription for Tessalon.  She is to continue using albuterol and Symbicort and take Mucinex in the morning with plenty of water and then let me know how that goes.  She is not in distress and she is okay for outpatient follow-up and she agrees with plan.

## 2022-10-27 ENCOUNTER — Other Ambulatory Visit: Payer: Self-pay | Admitting: Family Medicine

## 2022-10-28 ENCOUNTER — Ambulatory Visit: Payer: Medicare HMO | Admitting: Family Medicine

## 2022-11-02 ENCOUNTER — Other Ambulatory Visit: Payer: Self-pay | Admitting: Neurology

## 2022-11-02 ENCOUNTER — Encounter: Payer: Self-pay | Admitting: Neurology

## 2022-11-02 MED ORDER — LAMOTRIGINE 100 MG PO TABS: 100.0000 mg | ORAL_TABLET | Freq: Every day | ORAL | 3 refills | Status: DC

## 2022-11-02 NOTE — Telephone Encounter (Signed)
I will give her a new prescription of Lamotrigine 100 mg twice daily. Once she finished the levetiracetam, she should stop it. No need for refill for the Levetiracetam.

## 2022-11-03 ENCOUNTER — Telehealth: Payer: Self-pay | Admitting: Neurology

## 2022-11-03 NOTE — Telephone Encounter (Signed)
Information faxed

## 2022-11-03 NOTE — Telephone Encounter (Signed)
Information needed was faxed to [place] and given to [facility] for review and signature, will send once signed.

## 2022-11-03 NOTE — Telephone Encounter (Signed)
Pt stated she needs someone to reach out to her insurance company because Dr. Teresa Coombs wants her to take nasal rescue medication. Stated someone needs to call insurance at [phone] case number [id number] because they have some medical question before medications is approved.

## 2022-11-08 ENCOUNTER — Telehealth: Payer: Self-pay

## 2022-11-08 NOTE — Telephone Encounter (Signed)
Call to patient to inform of Dr. Teresa Coombs recommendations, she verbalized understanding to call back if not better in 2 weeks if or symptoms worsen. Apprecative of call.

## 2022-11-08 NOTE — Telephone Encounter (Signed)
This should improve within a week. Please call us if no improvement after 2 weeks. Thanks

## 2022-11-08 NOTE — Telephone Encounter (Signed)
Call to patient after receiving MyChart message about a recent fall and tremors in hands while weaning off Keppra. Patient stated that fall happened on Sunday and she felt off balance and leaned into wall and "slip to ground" but hit pretty hard on tile floor. No head strike with this fall. Sunday was the last day that she had Keppra as well. She also has had tremors in both hands that varies since tapering off of the Keppra. She does state that she has been taking the Lamictal as prescribed 100 mg 2x daily. She does report hitting her head on her footboard when getting something off the floor.  She denies any seizure activity, she is using her cane more for fear of falling and is wondering if she needs to be seen or if this is a result of weaning process and will resolve on its on. Advised I will discuss with Dr. Teresa Coombs and follow back up. Patient appreciative of call.

## 2022-11-25 ENCOUNTER — Telehealth: Payer: Self-pay | Admitting: Pharmacist

## 2022-11-25 NOTE — Progress Notes (Signed)
Contacted patient regarding upcoming appointment with [facility] pharmacist. Per clinical review, no pharmacist appointment needed at this time. Appointment canceled and voicemail left notifying patient. Patient can re-engage with pharmacy team with future medication needs.   Catie T. Brien Lowe, PharmD, BCACP, CPP Clinical Pharmacist [facility] [phone]  

## 2022-11-28 ENCOUNTER — Ambulatory Visit (INDEPENDENT_AMBULATORY_CARE_PROVIDER_SITE_OTHER)
Admission: RE | Admit: 2022-11-28 | Discharge: 2022-11-28 | Disposition: A | Discharge status: Home / Self Care | Payer: Medicare HMO | Source: Ambulatory Visit | Attending: Family Medicine | Admitting: Family Medicine

## 2022-11-28 ENCOUNTER — Ambulatory Visit (INDEPENDENT_AMBULATORY_CARE_PROVIDER_SITE_OTHER): Payer: Medicare HMO | Admitting: Family Medicine

## 2022-11-28 ENCOUNTER — Encounter: Payer: Self-pay | Admitting: Family Medicine

## 2022-11-28 VITALS — BP 122/78 | HR 88 | Temp 97.7°F | Ht <= 58 in | Wt 152.0 lb

## 2022-11-28 DIAGNOSIS — R059 Cough, unspecified: Secondary | ICD-10-CM | POA: Diagnosis not present

## 2022-11-28 DIAGNOSIS — R251 Tremor, unspecified: Secondary | ICD-10-CM

## 2022-11-28 MED ORDER — PREDNISONE 10 MG PO TABS: ORAL_TABLET | ORAL | 0 refills | Status: DC

## 2022-11-28 NOTE — Patient Instructions (Addendum)
Go to the lab on the way out.   If you have mychart we'll likely use that to update you.    Check to see what sugar meter/strips are covered and let us know.  Take prednisone 2 a day for 5 days, then 1 a day for 5 days, with food. Don't take with aleve/ibuprofen. Take care.  Glad to see you.

## 2022-11-28 NOTE — Progress Notes (Unsigned)
Cough X several months. Patient states it is productive and gets runny nose and eyes almost every day. Takes mucinex but has not helped get rid of anything.  Watery eyes and runny nose.   She had prev URI sx in ~09/2022 and 10/2022.  She had cough prior to that but it was controlled.  Sx worse in the meantime, since prev illness.  Clearly sputum in the meantime.    Already using albuterol PRN with symbicort BID.  Hadn't noticed a change in tremor with SABA use.  Already using flonase, zyrtec.    Now with L hand tremor, predates carpal tunnel sx.  Tremor noted more on keppra, less on lamictal.  Tremor predates keppra.  I need to update neurology.  Dr. Teresa Coombs.    D/w pt about vit D vs B6 dosing.  See med list.     Allergies Post infectious ACE COPD Asthma.  GERD.   Last A1c 6.7, not on meds for DM2.      L hand tremor.   No wheeze, ctab now.   No stridor.   rrr

## 2022-11-29 ENCOUNTER — Encounter: Payer: Medicare HMO | Admitting: Pharmacist

## 2022-11-30 ENCOUNTER — Other Ambulatory Visit: Payer: Self-pay | Admitting: Family Medicine

## 2022-11-30 DIAGNOSIS — R251 Tremor, unspecified: Secondary | ICD-10-CM | POA: Insufficient documentation

## 2022-11-30 NOTE — Assessment & Plan Note (Signed)
Ddx for cough d/w pt.   Allergies Post infectious cough ACE COPD Asthma.  GERD.   Unlikely to be ACE cough, though that is possible.  Could be postinfectious versus environmental allergy/postnasal drip.  She could have a multifactorial cough.  Discussed options.  Reasonable to try prednisone with routine steroid cautions and she can update me if she is not improving.  She agrees with plan.  Okay for outpatient follow-up. This way we are only making 1 medicine change at a time.

## 2022-11-30 NOTE — Assessment & Plan Note (Signed)
I will ask for neurology input here.  I thank all involved.

## 2022-12-04 ENCOUNTER — Encounter: Payer: Self-pay | Admitting: Family Medicine

## 2022-12-05 ENCOUNTER — Encounter: Payer: Medicare HMO | Admitting: Pharmacist

## 2022-12-16 ENCOUNTER — Ambulatory Visit (INDEPENDENT_AMBULATORY_CARE_PROVIDER_SITE_OTHER): Payer: Medicare HMO | Admitting: Family Medicine

## 2022-12-16 ENCOUNTER — Encounter: Payer: Self-pay | Admitting: Family Medicine

## 2022-12-16 VITALS — BP 122/70 | HR 98 | Temp 97.2°F | Ht <= 58 in | Wt 150.0 lb

## 2022-12-16 DIAGNOSIS — R569 Unspecified convulsions: Secondary | ICD-10-CM | POA: Diagnosis not present

## 2022-12-16 DIAGNOSIS — R251 Tremor, unspecified: Secondary | ICD-10-CM

## 2022-12-16 DIAGNOSIS — E119 Type 2 diabetes mellitus without complications: Secondary | ICD-10-CM | POA: Diagnosis not present

## 2022-12-16 LAB — POCT GLYCOSYLATED HEMOGLOBIN (HGB A1C): Hemoglobin A1C: 6.6 % — AB (ref 4.0–5.6)

## 2022-12-16 NOTE — Patient Instructions (Addendum)
Recheck at a visit in about 4 months- A1c at the visit.  You don't need to fast.  Take care.  Glad to see you.
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
## 2022-12-17 NOTE — Assessment & Plan Note (Signed)
I asked her to follow-up with neurology.  She agreed.

## 2022-12-17 NOTE — Assessment & Plan Note (Signed)
Driving cautions discussed with patient.  No new events.  Continue Lamictal as is.

## 2022-12-17 NOTE — Assessment & Plan Note (Signed)
Recheck at a visit in about 4 months- A1c at the visit.  No change in medications at this point.  Continue work on diet and exercise.  Goal to avoid hypoglycemia.

## 2023-01-10 ENCOUNTER — Other Ambulatory Visit (HOSPITAL_BASED_OUTPATIENT_CLINIC_OR_DEPARTMENT_OTHER): Payer: Self-pay

## 2023-02-09 DIAGNOSIS — Z9011 Acquired absence of right breast and nipple: Secondary | ICD-10-CM | POA: Diagnosis not present

## 2023-02-09 DIAGNOSIS — Z853 Personal history of malignant neoplasm of breast: Secondary | ICD-10-CM | POA: Diagnosis not present

## 2023-02-09 DIAGNOSIS — Z08 Encounter for follow-up examination after completed treatment for malignant neoplasm: Secondary | ICD-10-CM | POA: Diagnosis not present

## 2023-02-09 DIAGNOSIS — Z1231 Encounter for screening mammogram for malignant neoplasm of breast: Secondary | ICD-10-CM | POA: Diagnosis not present

## 2023-02-09 DIAGNOSIS — Z9221 Personal history of antineoplastic chemotherapy: Secondary | ICD-10-CM | POA: Diagnosis not present

## 2023-02-09 DIAGNOSIS — R92322 Mammographic fibroglandular density, left breast: Secondary | ICD-10-CM | POA: Diagnosis not present

## 2023-02-09 DIAGNOSIS — R921 Mammographic calcification found on diagnostic imaging of breast: Secondary | ICD-10-CM | POA: Diagnosis not present

## 2023-02-09 DIAGNOSIS — Z9229 Personal history of other drug therapy: Secondary | ICD-10-CM | POA: Diagnosis not present

## 2023-02-09 LAB — HM MAMMOGRAPHY: HM Mammogram: NORMAL (ref 0–4)

## 2023-02-17 ENCOUNTER — Telehealth: Payer: Self-pay | Admitting: Family Medicine

## 2023-02-17 NOTE — Telephone Encounter (Signed)
She has an appointment with me in November, we will try to get her in sooner.

## 2023-02-17 NOTE — Telephone Encounter (Signed)
This kind patient has had multiple falls without seizures in the meantime and I need your input re: her gait unsteadiness.  Thanks.

## 2023-02-19 NOTE — Telephone Encounter (Signed)
Greatly appreciate help from Dr. Teresa Coombs.    See below, please update patient. Thanks.

## 2023-02-20 NOTE — Telephone Encounter (Signed)
Called patient reviewed all information and repeated back to me. Will call if any questions.  ? ?

## 2023-02-27 ENCOUNTER — Other Ambulatory Visit (INDEPENDENT_AMBULATORY_CARE_PROVIDER_SITE_OTHER): Payer: Self-pay

## 2023-02-27 DIAGNOSIS — G40209 Localization-related (focal) (partial) symptomatic epilepsy and epileptic syndromes with complex partial seizures, not intractable, without status epilepticus: Secondary | ICD-10-CM | POA: Diagnosis not present

## 2023-02-27 DIAGNOSIS — Z0289 Encounter for other administrative examinations: Secondary | ICD-10-CM

## 2023-02-27 DIAGNOSIS — Z5181 Encounter for therapeutic drug level monitoring: Secondary | ICD-10-CM | POA: Diagnosis not present

## 2023-02-28 LAB — LAMOTRIGINE LEVEL: Lamotrigine Lvl: 10.6 ug/mL (ref 2.0–20.0)

## 2023-03-01 ENCOUNTER — Encounter: Payer: Self-pay | Admitting: Neurology

## 2023-03-01 ENCOUNTER — Ambulatory Visit: Payer: Medicare HMO | Admitting: Neurology

## 2023-03-01 VITALS — BP 114/64 | Ht <= 58 in | Wt 148.0 lb

## 2023-03-01 DIAGNOSIS — R251 Tremor, unspecified: Secondary | ICD-10-CM

## 2023-03-01 DIAGNOSIS — G40209 Localization-related (focal) (partial) symptomatic epilepsy and epileptic syndromes with complex partial seizures, not intractable, without status epilepticus: Secondary | ICD-10-CM | POA: Diagnosis not present

## 2023-03-01 NOTE — Patient Instructions (Signed)
Continue lamotrigine 100 mg daily Continue your other medications Please contact me if you do have a breakthrough seizure Follow-up as scheduled in November.

## 2023-03-01 NOTE — Telephone Encounter (Signed)
Correction to previous note Lamotrigine should be 1x daily, not 2x daily. Patietn was aware of correct dose and was only taking 1x daily.
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
## 2023-03-06 ENCOUNTER — Other Ambulatory Visit: Payer: Self-pay | Admitting: Family Medicine

## 2023-03-24 ENCOUNTER — Other Ambulatory Visit: Payer: Self-pay | Admitting: Family Medicine

## 2023-04-13 ENCOUNTER — Encounter: Payer: Self-pay | Admitting: Neurology

## 2023-04-13 ENCOUNTER — Ambulatory Visit: Payer: Medicare HMO | Admitting: Neurology

## 2023-04-13 VITALS — BP 130/78 | Resp 14 | Ht <= 58 in | Wt 148.0 lb

## 2023-04-13 DIAGNOSIS — G40209 Localization-related (focal) (partial) symptomatic epilepsy and epileptic syndromes with complex partial seizures, not intractable, without status epilepticus: Secondary | ICD-10-CM

## 2023-04-13 DIAGNOSIS — G250 Essential tremor: Secondary | ICD-10-CM | POA: Diagnosis not present

## 2023-04-13 NOTE — Patient Instructions (Signed)
Continue current medications Please contact us sooner if you would want to start medication for tremor, most likely will start patient on propranolol Continue to follow with PCP Return in 1 year or sooner if worse.
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
## 2023-04-14 ENCOUNTER — Other Ambulatory Visit: Payer: Self-pay | Admitting: Family Medicine

## 2023-04-14 NOTE — Telephone Encounter (Signed)
Your last note you wanted to follow up for A1c in 4 months it looked to me like you wanted her to be seen in office. I see where she has lab only set up this month. Is that ok or do I need to call to have her change?

## 2023-04-16 NOTE — Telephone Encounter (Signed)
Rx sent.  Please set up OV with A1c at the visit.   Thanks.

## 2023-04-17 NOTE — Telephone Encounter (Signed)
Called patient left message to call office.  Please try to reach patient she needs to change her lab appointment on 04/17/23 to next available office visit with Dr. Para March

## 2023-04-17 NOTE — Telephone Encounter (Signed)
R/s patient for an OV on 11/15

## 2023-04-17 NOTE — Telephone Encounter (Signed)
LVM for patient to cb and reschedule.

## 2023-04-18 ENCOUNTER — Other Ambulatory Visit: Payer: Medicare HMO

## 2023-04-21 ENCOUNTER — Encounter: Payer: Self-pay | Admitting: Family Medicine

## 2023-04-21 ENCOUNTER — Ambulatory Visit (INDEPENDENT_AMBULATORY_CARE_PROVIDER_SITE_OTHER): Payer: Medicare HMO | Admitting: Family Medicine

## 2023-04-21 VITALS — BP 122/68 | HR 89 | Temp 98.6°F | Ht <= 58 in | Wt 148.0 lb

## 2023-04-21 DIAGNOSIS — E119 Type 2 diabetes mellitus without complications: Secondary | ICD-10-CM | POA: Diagnosis not present

## 2023-04-21 DIAGNOSIS — R569 Unspecified convulsions: Secondary | ICD-10-CM | POA: Diagnosis not present

## 2023-04-21 DIAGNOSIS — M2550 Pain in unspecified joint: Secondary | ICD-10-CM

## 2023-04-21 DIAGNOSIS — R251 Tremor, unspecified: Secondary | ICD-10-CM | POA: Diagnosis not present

## 2023-04-21 LAB — POCT GLYCOSYLATED HEMOGLOBIN (HGB A1C): Hemoglobin A1C: 6 % — AB (ref 4.0–5.6)

## 2023-04-21 MED ORDER — HYDROCHLOROTHIAZIDE 12.5 MG PO TABS: 12.5000 mg | ORAL_TABLET | Freq: Every day | ORAL | Status: DC

## 2023-04-21 NOTE — Progress Notes (Unsigned)
Diabetes:  No meds.   Hypoglycemic episodes: no sx Hyperglycemic episodes: no sx Feet problems: see below.  No tingling.   Blood Sugars averaging: not checked.   eye exam within last year: yes A1c 6, d/w pt at OV.   Her husband has mult medical concerns and hearing loss. She has two friends living with pt and husband long term.  She is putting up with tremor, has seen neurology.  No ADE with change to lamictal.   Recent R foot pain, midfoot and heel pain.  It is getting some better but her gait was affected, had been icing.  Still using meloxicam.  That usually helps until she has a flare of OA.    Meds, vitals, and allergies reviewed.   ROS: Per HPI unless specifically indicated in ROS section   GEN: nad, alert and oriented HEENT: ncat NECK: supple w/o LA CV: rrr. PULM: ctab, no inc wob ABD: soft, +bs EXT: no edema SKIN: no acute rash L hand tremor with movement, not at rest.    Diabetic foot exam: Normal inspection No skin breakdown No calluses  Normal DP pulses Normal sensation to light touch and monofilament Nails normal

## 2023-04-21 NOTE — Patient Instructions (Addendum)
Recheck in about 4 months at a yearly visit, labs ahead of time.  Take care.  Glad to see you. Update me as needed.

## 2023-04-23 MED ORDER — PREDNISONE 10 MG PO TABS: ORAL_TABLET | ORAL | 0 refills | Status: DC

## 2023-04-23 NOTE — Assessment & Plan Note (Signed)
A1c 6.  Continue work on diet and exercise.  Recheck periodically.

## 2023-04-23 NOTE — Assessment & Plan Note (Signed)
History of. No ADE with change to lamictal.  No events in the meantime.

## 2023-04-23 NOTE — Assessment & Plan Note (Signed)
Discussed options.  Continue meloxicam and icing for now.  She could use a short course of prednisone if needed since her A1c has improved.  She opted out of prednisone at this point.

## 2023-04-23 NOTE — Assessment & Plan Note (Signed)
She can update me as needed.  She is putting up with intention tremor.

## 2023-05-29 DIAGNOSIS — E119 Type 2 diabetes mellitus without complications: Secondary | ICD-10-CM | POA: Diagnosis not present

## 2023-05-29 DIAGNOSIS — Z961 Presence of intraocular lens: Secondary | ICD-10-CM | POA: Diagnosis not present

## 2023-05-29 LAB — HM DIABETES EYE EXAM

## 2023-06-29 ENCOUNTER — Encounter: Payer: Self-pay | Admitting: Family Medicine

## 2023-07-01 ENCOUNTER — Other Ambulatory Visit: Payer: Self-pay | Admitting: Family Medicine

## 2023-07-01 MED ORDER — HYDROCHLOROTHIAZIDE 25 MG PO TABS: 25.0000 mg | ORAL_TABLET | Freq: Every day | ORAL | 3 refills | Status: DC

## 2023-07-01 MED ORDER — PREDNISONE 10 MG PO TABS: ORAL_TABLET | ORAL | 0 refills | Status: DC

## 2023-08-24 ENCOUNTER — Encounter: Payer: Self-pay | Admitting: Neurology

## 2023-08-28 ENCOUNTER — Other Ambulatory Visit: Payer: Self-pay | Admitting: Family Medicine

## 2023-09-10 ENCOUNTER — Other Ambulatory Visit: Payer: Self-pay | Admitting: Family Medicine

## 2023-09-10 DIAGNOSIS — E559 Vitamin D deficiency, unspecified: Secondary | ICD-10-CM

## 2023-09-10 DIAGNOSIS — E119 Type 2 diabetes mellitus without complications: Secondary | ICD-10-CM

## 2023-09-15 ENCOUNTER — Encounter: Payer: Self-pay | Admitting: Family Medicine

## 2023-09-18 ENCOUNTER — Other Ambulatory Visit: Payer: Self-pay | Admitting: Family Medicine

## 2023-09-18 DIAGNOSIS — E119 Type 2 diabetes mellitus without complications: Secondary | ICD-10-CM

## 2023-09-19 ENCOUNTER — Other Ambulatory Visit (INDEPENDENT_AMBULATORY_CARE_PROVIDER_SITE_OTHER): Payer: Medicare HMO

## 2023-09-19 DIAGNOSIS — E119 Type 2 diabetes mellitus without complications: Secondary | ICD-10-CM | POA: Diagnosis not present

## 2023-09-19 DIAGNOSIS — E559 Vitamin D deficiency, unspecified: Secondary | ICD-10-CM

## 2023-09-19 LAB — COMPREHENSIVE METABOLIC PANEL WITH GFR
ALT: 12 U/L (ref 0–35)
AST: 15 U/L (ref 0–37)
Albumin: 4.7 g/dL (ref 3.5–5.2)
Alkaline Phosphatase: 72 U/L (ref 39–117)
BUN: 44 mg/dL — ABNORMAL HIGH (ref 6–23)
CO2: 22 meq/L (ref 19–32)
Calcium: 9.7 mg/dL (ref 8.4–10.5)
Chloride: 105 meq/L (ref 96–112)
Creatinine, Ser: 1.54 mg/dL — ABNORMAL HIGH (ref 0.40–1.20)
GFR: 32.55 mL/min — ABNORMAL LOW (ref >60.00)
Glucose, Bld: 106 mg/dL — ABNORMAL HIGH (ref 70–99)
Potassium: 4.9 meq/L (ref 3.5–5.1)
Sodium: 136 meq/L (ref 135–145)
Total Bilirubin: 0.4 mg/dL (ref 0.2–1.2)
Total Protein: 7.1 g/dL (ref 6.0–8.3)

## 2023-09-19 LAB — CBC WITH DIFFERENTIAL/PLATELET
Basophils Absolute: 0.1 10*3/uL (ref 0.0–0.1)
Basophils Relative: 0.7 % (ref 0.0–3.0)
Eosinophils Absolute: 0.3 10*3/uL (ref 0.0–0.7)
Eosinophils Relative: 3.8 % (ref 0.0–5.0)
HCT: 38 % (ref 36.0–46.0)
Hemoglobin: 12.4 g/dL (ref 12.0–15.0)
Lymphocytes Relative: 18.5 % (ref 12.0–46.0)
Lymphs Abs: 1.6 10*3/uL (ref 0.7–4.0)
MCHC: 32.6 g/dL (ref 30.0–36.0)
MCV: 95.4 fl (ref 78.0–100.0)
Monocytes Absolute: 0.7 10*3/uL (ref 0.1–1.0)
Monocytes Relative: 8.2 % (ref 3.0–12.0)
Neutro Abs: 5.9 10*3/uL (ref 1.4–7.7)
Neutrophils Relative %: 68.8 % (ref 43.0–77.0)
Platelets: 364 10*3/uL (ref 150.0–400.0)
RBC: 3.98 Mil/uL (ref 3.87–5.11)
RDW: 15.4 % (ref 11.5–15.5)
WBC: 8.6 10*3/uL (ref 4.0–10.5)

## 2023-09-19 LAB — VITAMIN D 25 HYDROXY (VIT D DEFICIENCY, FRACTURES): VITD: 55.71 ng/mL (ref 30.00–100.00)

## 2023-09-19 LAB — HEMOGLOBIN A1C: Hgb A1c MFr Bld: 6.1 % (ref 4.6–6.5)

## 2023-09-19 LAB — LIPID PANEL
Cholesterol: 187 mg/dL (ref 0–200)
HDL: 55.1 mg/dL (ref >39.00)
LDL Cholesterol: 90 mg/dL (ref 0–99)
NonHDL: 131.58
Total CHOL/HDL Ratio: 3
Triglycerides: 206 mg/dL — ABNORMAL HIGH (ref 0.0–149.0)
VLDL: 41.2 mg/dL — ABNORMAL HIGH (ref 0.0–40.0)

## 2023-09-19 LAB — MICROALBUMIN / CREATININE URINE RATIO
Creatinine,U: 85.1 mg/dL
Microalb Creat Ratio: 49.9 mg/g — ABNORMAL HIGH (ref 0.0–30.0)
Microalb, Ur: 4.2 mg/dL — ABNORMAL HIGH (ref 0.0–1.9)

## 2023-09-19 LAB — TSH: TSH: 3.04 u[IU]/mL (ref 0.35–5.50)

## 2023-09-19 LAB — VITAMIN B12: Vitamin B-12: 245 pg/mL (ref 211–911)

## 2023-09-26 ENCOUNTER — Encounter: Payer: Self-pay | Admitting: Family Medicine

## 2023-09-26 ENCOUNTER — Ambulatory Visit (INDEPENDENT_AMBULATORY_CARE_PROVIDER_SITE_OTHER): Payer: Medicare HMO | Admitting: Family Medicine

## 2023-09-26 VITALS — BP 132/78 | HR 68 | Temp 98.3°F | Ht <= 58 in | Wt 146.0 lb

## 2023-09-26 DIAGNOSIS — E7800 Pure hypercholesterolemia, unspecified: Secondary | ICD-10-CM

## 2023-09-26 DIAGNOSIS — I10 Essential (primary) hypertension: Secondary | ICD-10-CM

## 2023-09-26 DIAGNOSIS — Z1211 Encounter for screening for malignant neoplasm of colon: Secondary | ICD-10-CM | POA: Diagnosis not present

## 2023-09-26 DIAGNOSIS — R7989 Other specified abnormal findings of blood chemistry: Secondary | ICD-10-CM | POA: Diagnosis not present

## 2023-09-26 DIAGNOSIS — E538 Deficiency of other specified B group vitamins: Secondary | ICD-10-CM

## 2023-09-26 DIAGNOSIS — R569 Unspecified convulsions: Secondary | ICD-10-CM | POA: Diagnosis not present

## 2023-09-26 DIAGNOSIS — J45909 Unspecified asthma, uncomplicated: Secondary | ICD-10-CM

## 2023-09-26 DIAGNOSIS — Z79818 Long term (current) use of other agents affecting estrogen receptors and estrogen levels: Secondary | ICD-10-CM | POA: Diagnosis not present

## 2023-09-26 DIAGNOSIS — Z0000 Encounter for general adult medical examination without abnormal findings: Secondary | ICD-10-CM

## 2023-09-26 DIAGNOSIS — Z7189 Other specified counseling: Secondary | ICD-10-CM

## 2023-09-26 LAB — BASIC METABOLIC PANEL WITH GFR
BUN: 50 mg/dL — ABNORMAL HIGH (ref 6–23)
CO2: 20 meq/L (ref 19–32)
Calcium: 9.8 mg/dL (ref 8.4–10.5)
Chloride: 106 meq/L (ref 96–112)
Creatinine, Ser: 1.74 mg/dL — ABNORMAL HIGH (ref 0.40–1.20)
GFR: 28.11 mL/min — ABNORMAL LOW (ref >60.00)
Glucose, Bld: 104 mg/dL — ABNORMAL HIGH (ref 70–99)
Potassium: 5.3 meq/L — ABNORMAL HIGH (ref 3.5–5.1)
Sodium: 136 meq/L (ref 135–145)

## 2023-09-26 MED ORDER — VITAMIN B-12 1000 MCG PO TABS: 1000.0000 ug | ORAL_TABLET | Freq: Every day | ORAL | Status: AC

## 2023-09-26 MED ORDER — ESTRADIOL 0.1 MG/GM VA CREA
TOPICAL_CREAM | VAGINAL | 12 refills | Status: AC
Start: 2023-09-26 — End: ?

## 2023-09-26 MED ORDER — PRAVASTATIN SODIUM 80 MG PO TABS: 80.0000 mg | ORAL_TABLET | Freq: Every day | ORAL | 3 refills | Status: AC

## 2023-09-26 MED ORDER — LISINOPRIL 10 MG PO TABS: 10.0000 mg | ORAL_TABLET | Freq: Every day | ORAL | 3 refills | Status: DC

## 2023-09-26 MED ORDER — HYDROCHLOROTHIAZIDE 25 MG PO TABS: 25.0000 mg | ORAL_TABLET | Freq: Every day | ORAL | 3 refills | Status: DC

## 2023-09-26 NOTE — Patient Instructions (Signed)
 Go to the lab on the way out.   If you have mychart we'll likely use that to update you.    Take care.  Glad to see you. I would add on 1000mcg B12 daily.

## 2023-09-26 NOTE — Progress Notes (Signed)
 She has a friend who moved in for a long term stay, who may potentially move out.  Stressors d/w pt.    H/o SZ. Still on lamictal  at baseline.  No recent events.  She has eye photosensitivity and had eye clinic eval.    Cr elevation d/w pt.  Recheck pending.  MALB elevated.  See notes on labs.    Not diabetic by A1c.  D/w pt.    Elevated Cholesterol: Using medications without problems:yes Muscle aches: no  Diet compliance: d/w pt.  Exercise: d/w pt.    Hypertension:    Using medication without problems or lightheadedness: yes Chest pain with exertion:no Edema:no Short of breath: occ, seasonal with pollen exposure.    She improved with taking mucinex this spring, d/w pt.  Rare use of wixela. Rare use of SABA.  She is still doing well with less med use.    B12 low normal.  D/w pt about options.    Tetanus 2024 RSV prev done.  PNA prev done.  Flu 2024 Covid prev done.  Advance directive- husband designated if patient were incapacitated.  Then granddaughter Ova Bloomer designated if needed.  Mammogram 2024 DXA options d/w pt.  She wanted to defer 2025.  Cologuard ordered 2025.     Topical estrogen helped and with use she doesn't have UTI frequency like prior.    PMH and SH reviewed  Meds, vitals, and allergies reviewed.   ROS: Per HPI unless specifically indicated in ROS section   GEN: nad, alert and oriented HEENT: ncat NECK: supple w/o LA CV: rrr. PULM: ctab, no inc wob ABD: soft, +bs EXT: no edema SKIN: well perfused.

## 2023-09-27 ENCOUNTER — Other Ambulatory Visit: Payer: Self-pay | Admitting: Family Medicine

## 2023-09-27 ENCOUNTER — Encounter: Payer: Self-pay | Admitting: Family Medicine

## 2023-09-27 DIAGNOSIS — E538 Deficiency of other specified B group vitamins: Secondary | ICD-10-CM | POA: Insufficient documentation

## 2023-09-27 DIAGNOSIS — R7989 Other specified abnormal findings of blood chemistry: Secondary | ICD-10-CM

## 2023-09-27 MED ORDER — LISINOPRIL 10 MG PO TABS: ORAL_TABLET | ORAL | Status: DC

## 2023-09-27 NOTE — Assessment & Plan Note (Signed)
 Tetanus 2024 RSV prev done.  PNA prev done.  Flu 2024 Covid prev done.  Advance directive- husband designated if patient were incapacitated.  Then granddaughter Ova Bloomer designated if needed.  Mammogram 2024 DXA options d/w pt.  She wanted to defer 2025.  Cologuard ordered 2025.

## 2023-09-27 NOTE — Assessment & Plan Note (Signed)
 She improved with Mucinex use this spring.  Would continue as is.

## 2023-09-27 NOTE — Assessment & Plan Note (Signed)
 Continue pravastatin

## 2023-09-27 NOTE — Assessment & Plan Note (Signed)
 Topical estrogen helped and with use she doesn't have UTI frequency like prior.  Would continue as is.

## 2023-09-27 NOTE — Assessment & Plan Note (Signed)
 See notes on labs.  Stop meloxicam  in the meantime.

## 2023-09-27 NOTE — Assessment & Plan Note (Signed)
Advance directive- husband designated if patient were incapacitated.  Then granddaughter Lelon Mast designated if needed.

## 2023-09-27 NOTE — Assessment & Plan Note (Signed)
 Discussed options.  With low normal level, I would add on 1000mcg B12 daily.

## 2023-09-27 NOTE — Assessment & Plan Note (Signed)
 Continue Lamictal  as is.

## 2023-09-27 NOTE — Assessment & Plan Note (Signed)
 See notes on follow-up labs.  Continue lisinopril  hydrochlorothiazide .

## 2023-10-04 ENCOUNTER — Ambulatory Visit: Payer: Medicare HMO

## 2023-10-04 VITALS — Ht <= 58 in | Wt 143.0 lb

## 2023-10-04 DIAGNOSIS — Z0000 Encounter for general adult medical examination without abnormal findings: Secondary | ICD-10-CM | POA: Diagnosis not present

## 2023-10-04 NOTE — Telephone Encounter (Signed)
 Yes, you can accept.
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
## 2023-10-04 NOTE — Patient Instructions (Signed)
 Katie [name] , Thank you for taking time to come for your Medicare Wellness Visit. I appreciate your ongoing commitment to your health goals. Please review the following plan we discussed and let me know if I can assist you in the future.   Referrals/Orders/Follow-Ups/Clinician Recommendations: none  This is a list of the screening recommended for you and due dates:  Health Maintenance  Topic Date Due   Zoster (Shingles) Vaccine (1 of 2) Never done   Pneumonia Vaccine (3 of 3 - PCV20 or PCV21) 04/01/2019   Flu Shot  01/05/2024   Mammogram  02/09/2024   Hemoglobin A1C  03/20/2024   Complete foot exam   04/20/2024   Eye exam for diabetics  05/28/2024   Yearly kidney health urinalysis for diabetes  09/18/2024   Yearly kidney function blood test for diabetes  09/25/2024   Medicare Annual Wellness Visit  10/03/2024   DTaP/Tdap/Td vaccine (4 - Td or Tdap) 08/31/2032   DEXA scan (bone density measurement)  Completed   Hepatitis C Screening  Completed   HPV Vaccine  Aged Out   Meningitis B Vaccine  Aged Out   COVID-19 Vaccine  Discontinued   Cologuard (Stool DNA test)  Discontinued    Advanced directives: (Copy Requested) Please bring a copy of your health care power of attorney and living will to the office to be added to your chart at your convenience. You can mail to [facility] [address] [place], [place] [address] or email to [email or web address]  Next Medicare Annual Wellness Visit scheduled for next year: Yes 10/04/24 @9 :30am televisit

## 2023-10-05 ENCOUNTER — Other Ambulatory Visit (INDEPENDENT_AMBULATORY_CARE_PROVIDER_SITE_OTHER)

## 2023-10-05 DIAGNOSIS — R7989 Other specified abnormal findings of blood chemistry: Secondary | ICD-10-CM

## 2023-10-05 LAB — BASIC METABOLIC PANEL WITH GFR
BUN: 43 mg/dL — ABNORMAL HIGH (ref 6–23)
CO2: 23 meq/L (ref 19–32)
Calcium: 9.5 mg/dL (ref 8.4–10.5)
Chloride: 103 meq/L (ref 96–112)
Creatinine, Ser: 1.52 mg/dL — ABNORMAL HIGH (ref 0.40–1.20)
GFR: 33.05 mL/min — ABNORMAL LOW (ref >60.00)
Glucose, Bld: 107 mg/dL — ABNORMAL HIGH (ref 70–99)
Potassium: 4.4 meq/L (ref 3.5–5.1)
Sodium: 136 meq/L (ref 135–145)

## 2023-10-07 ENCOUNTER — Other Ambulatory Visit: Payer: Self-pay | Admitting: Neurology

## 2023-10-07 DIAGNOSIS — Z1211 Encounter for screening for malignant neoplasm of colon: Secondary | ICD-10-CM | POA: Diagnosis not present

## 2023-10-08 ENCOUNTER — Other Ambulatory Visit: Payer: Self-pay | Admitting: Family Medicine

## 2023-10-08 DIAGNOSIS — R7989 Other specified abnormal findings of blood chemistry: Secondary | ICD-10-CM

## 2023-10-11 ENCOUNTER — Other Ambulatory Visit: Payer: Self-pay | Admitting: Family Medicine

## 2023-10-11 MED ORDER — ASPIRIN 81 MG PO TBEC: 81.0000 mg | DELAYED_RELEASE_TABLET | Freq: Every day | ORAL | Status: AC

## 2023-10-14 LAB — COLOGUARD: COLOGUARD: POSITIVE — AB

## 2023-10-15 ENCOUNTER — Other Ambulatory Visit: Payer: Self-pay | Admitting: Family Medicine

## 2023-10-15 DIAGNOSIS — R195 Other fecal abnormalities: Secondary | ICD-10-CM

## 2023-10-15 MED ORDER — NYSTATIN 100000 UNIT/GM EX CREA: 1.0000 | TOPICAL_CREAM | Freq: Two times a day (BID) | CUTANEOUS | 12 refills | Status: AC

## 2023-10-17 ENCOUNTER — Encounter: Payer: Self-pay | Admitting: Family Medicine

## 2023-10-17 ENCOUNTER — Ambulatory Visit: Payer: Self-pay

## 2023-10-18 ENCOUNTER — Telehealth: Payer: Self-pay | Admitting: Family Medicine

## 2023-10-18 ENCOUNTER — Other Ambulatory Visit: Payer: Self-pay | Admitting: Family Medicine

## 2023-10-18 DIAGNOSIS — R195 Other fecal abnormalities: Secondary | ICD-10-CM

## 2023-10-18 NOTE — Telephone Encounter (Signed)
 Patient notified that the referral is set to [facility]

## 2023-10-18 NOTE — Telephone Encounter (Signed)
 Copied from CRM [id number]. Topic: Referral - Question >> Oct 18, 2023 11:26 AM Elita Guitar wrote: Reason for CRM: patient called and asked if her referral for gastro can be changed to a different clinic because she doesn't want to go to [facility]. She stated that she'll go anywhere else but there. Please call and advise at [phone].

## 2023-11-01 ENCOUNTER — Encounter: Payer: Self-pay | Admitting: Internal Medicine

## 2023-11-03 ENCOUNTER — Telehealth: Payer: Self-pay

## 2023-11-03 NOTE — Telephone Encounter (Signed)
 Katie [name],   This patient is was an add on for PV on 11/06/23. I see a note about possible history of difficult airway. Can you please review this patients chart for eligibility for [facility] vs hospital.   Thank you, PV

## 2023-11-06 ENCOUNTER — Encounter

## 2023-11-06 ENCOUNTER — Telehealth: Payer: Self-pay

## 2023-11-06 NOTE — Telephone Encounter (Signed)
 Pt marked with difficult airway. Per Dr. Rosaline Coma pt will need an OV prior to proceeding. In depth VM left making the patient aware. PV and colonoscopy at [facility] will be cancelled. Please have pt r/s for an OV if she calls back unless she has any further questions.   Thank you, PV

## 2023-11-06 NOTE — Telephone Encounter (Signed)
 Pts PV at 2:00 PM today,   John if you could review before then that would be great.   Thank you

## 2023-11-16 DIAGNOSIS — E875 Hyperkalemia: Secondary | ICD-10-CM | POA: Diagnosis not present

## 2023-11-16 DIAGNOSIS — R8290 Unspecified abnormal findings in urine: Secondary | ICD-10-CM | POA: Diagnosis not present

## 2023-11-16 DIAGNOSIS — N184 Chronic kidney disease, stage 4 (severe): Secondary | ICD-10-CM | POA: Diagnosis not present

## 2023-11-16 DIAGNOSIS — R809 Proteinuria, unspecified: Secondary | ICD-10-CM | POA: Diagnosis not present

## 2023-11-16 DIAGNOSIS — E785 Hyperlipidemia, unspecified: Secondary | ICD-10-CM | POA: Diagnosis not present

## 2023-11-16 DIAGNOSIS — I129 Hypertensive chronic kidney disease with stage 1 through stage 4 chronic kidney disease, or unspecified chronic kidney disease: Secondary | ICD-10-CM | POA: Diagnosis not present

## 2023-11-17 ENCOUNTER — Other Ambulatory Visit: Payer: Self-pay | Admitting: Nephrology

## 2023-11-17 DIAGNOSIS — R809 Proteinuria, unspecified: Secondary | ICD-10-CM

## 2023-11-17 DIAGNOSIS — R8290 Unspecified abnormal findings in urine: Secondary | ICD-10-CM

## 2023-11-17 DIAGNOSIS — I129 Hypertensive chronic kidney disease with stage 1 through stage 4 chronic kidney disease, or unspecified chronic kidney disease: Secondary | ICD-10-CM

## 2023-11-17 DIAGNOSIS — N184 Chronic kidney disease, stage 4 (severe): Secondary | ICD-10-CM

## 2023-11-23 ENCOUNTER — Ambulatory Visit
Admission: RE | Admit: 2023-11-23 | Discharge: 2023-11-23 | Disposition: A | Discharge status: Home / Self Care | Source: Ambulatory Visit | Attending: Nephrology | Admitting: Nephrology

## 2023-11-23 DIAGNOSIS — R8290 Unspecified abnormal findings in urine: Secondary | ICD-10-CM | POA: Insufficient documentation

## 2023-11-23 DIAGNOSIS — N184 Chronic kidney disease, stage 4 (severe): Secondary | ICD-10-CM | POA: Diagnosis not present

## 2023-11-23 DIAGNOSIS — R809 Proteinuria, unspecified: Secondary | ICD-10-CM | POA: Insufficient documentation

## 2023-11-23 DIAGNOSIS — I129 Hypertensive chronic kidney disease with stage 1 through stage 4 chronic kidney disease, or unspecified chronic kidney disease: Secondary | ICD-10-CM | POA: Insufficient documentation

## 2023-11-27 DIAGNOSIS — E119 Type 2 diabetes mellitus without complications: Secondary | ICD-10-CM | POA: Diagnosis not present

## 2023-11-27 DIAGNOSIS — Z961 Presence of intraocular lens: Secondary | ICD-10-CM | POA: Diagnosis not present

## 2023-12-14 DIAGNOSIS — R809 Proteinuria, unspecified: Secondary | ICD-10-CM | POA: Diagnosis not present

## 2023-12-14 DIAGNOSIS — N1832 Chronic kidney disease, stage 3b: Secondary | ICD-10-CM | POA: Diagnosis not present

## 2023-12-14 DIAGNOSIS — E785 Hyperlipidemia, unspecified: Secondary | ICD-10-CM | POA: Diagnosis not present

## 2023-12-14 DIAGNOSIS — E875 Hyperkalemia: Secondary | ICD-10-CM | POA: Diagnosis not present

## 2023-12-14 DIAGNOSIS — I129 Hypertensive chronic kidney disease with stage 1 through stage 4 chronic kidney disease, or unspecified chronic kidney disease: Secondary | ICD-10-CM | POA: Diagnosis not present

## 2023-12-21 ENCOUNTER — Encounter: Admitting: Internal Medicine

## 2024-01-02 ENCOUNTER — Ambulatory Visit: Admitting: Physician Assistant
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
## 2024-01-25 ENCOUNTER — Encounter (HOSPITAL_COMMUNITY): Payer: Self-pay | Admitting: Internal Medicine

## 2024-01-25 ENCOUNTER — Ambulatory Visit: Admitting: Physician Assistant

## 2024-01-25 ENCOUNTER — Encounter: Payer: Self-pay | Admitting: Internal Medicine

## 2024-01-25 ENCOUNTER — Encounter: Payer: Self-pay | Admitting: Physician Assistant

## 2024-01-25 ENCOUNTER — Other Ambulatory Visit: Payer: Self-pay

## 2024-01-25 VITALS — BP 146/84 | HR 96 | Ht <= 58 in | Wt 140.5 lb

## 2024-01-25 DIAGNOSIS — T884XXA Failed or difficult intubation, initial encounter: Secondary | ICD-10-CM | POA: Diagnosis not present

## 2024-01-25 DIAGNOSIS — R569 Unspecified convulsions: Secondary | ICD-10-CM

## 2024-01-25 DIAGNOSIS — E119 Type 2 diabetes mellitus without complications: Secondary | ICD-10-CM

## 2024-01-25 DIAGNOSIS — N1832 Chronic kidney disease, stage 3b: Secondary | ICD-10-CM

## 2024-01-25 DIAGNOSIS — E1122 Type 2 diabetes mellitus with diabetic chronic kidney disease: Secondary | ICD-10-CM

## 2024-01-25 DIAGNOSIS — Z8673 Personal history of transient ischemic attack (TIA), and cerebral infarction without residual deficits: Secondary | ICD-10-CM | POA: Diagnosis not present

## 2024-01-25 DIAGNOSIS — R195 Other fecal abnormalities: Secondary | ICD-10-CM | POA: Diagnosis not present

## 2024-01-25 DIAGNOSIS — R1310 Dysphagia, unspecified: Secondary | ICD-10-CM | POA: Diagnosis not present

## 2024-01-25 NOTE — Patient Instructions (Addendum)
 _______________________________________________________  If your blood pressure at your visit was 140/90 or greater, please contact your primary care physician to follow up on this.  _______________________________________________________  If you are age 77 or older, your body mass index should be between 23-30. Your Body mass index is 31.5 kg/m. If this is out of the aforementioned range listed, please consider follow up with your Primary Care Provider.  If you are age 12 or younger, your body mass index should be between 19-25. Your Body mass index is 31.5 kg/m. If this is out of the aformentioned range listed, please consider follow up with your Primary Care Provider.   ________________________________________________________  The [facility] providers would like to encourage you to use MYCHART to communicate with providers for non-urgent requests or questions.  Due to long hold times on the telephone, sending your provider a message by [facility] may be a faster and more efficient way to get a response.  Please allow 48 business hours for a response.  Please remember that this is for non-urgent requests.  _______________________________________________________  [facility] is using a team-based approach to care.  Your team is made up of your doctor and two to three APPS. Our APPS (Nurse Practitioners and Physician Assistants) work with your physician to ensure care continuity for you. They are fully qualified to address your health concerns and develop a treatment plan. They communicate directly with your gastroenterologist to care for you. Seeing the Advanced Practice Practitioners on your physician's team can help you by facilitating care more promptly, often allowing for earlier appointments, access to diagnostic testing, procedures, and other specialty referrals.    You have been scheduled for a colonoscopy. Case number [id number]. Please follow written instructions given to you at  your visit today.   If you use inhalers (even only as needed), please bring them with you on the day of your procedure.  DO NOT TAKE 7 DAYS PRIOR TO TEST- Trulicity (dulaglutide) Ozempic, Wegovy (semaglutide) Mounjaro (tirzepatide) Bydureon Bcise (exanatide extended release)  DO NOT TAKE 1 DAY PRIOR TO YOUR TEST Rybelsus (semaglutide) Adlyxin (lixisenatide) Victoza (liraglutide) Byetta (exanatide) ___________________________________________________________________________  Toileting tips to help with your constipation - Drink at least 64-80 ounces of water/liquid per day. - Establish a time to try to move your bowels every day.  For many people, this is after a cup of coffee or after a meal such as breakfast. - Sit all of the way back on the toilet keeping your back fairly straight and while sitting up, try to rest the tops of your forearms on your upper thighs.   - Raising your feet with a step stool/squatty potty can be helpful to improve the angle that allows your stool to pass through the rectum. - Relax the rectum feeling it bulge toward the toilet water.  If you feel your rectum raising toward your body, you are contracting rather than relaxing. - Breathe in and slowly exhale. Belly breath by expanding your belly towards your belly button. Keep belly expanded as you gently direct pressure down and back to the anus.  A low pitched GRRR sound can assist with increasing intra-abdominal pressure.  (Can also trying to blow on a pinwheel and make it move, this helps with the same belly breathing) - Repeat 3-4 times. If unsuccessful, contract the pelvic floor to restore normal tone and get off the toilet.  Avoid excessive straining. - To reduce excessive wiping by teaching your anus to normally contract, place hands on outer aspect of knees  and resist knee movement outward.  Hold 5-10 second then place hands just inside of knees and resist inward movement of knees.  Hold 5 seconds.  Repeat  a few times each way.  Go to the ER if unable to pass gas, severe AB pain, unable to hold down food, any shortness of breath of chest pain.  Reflux Gourmet Rescue  It is an ALGINATE THERAPY which is the only intervention that works to safeguard the esophagus by creating a protective barrier that actually stops reflux from happening. -The general directions for use are as stated on the packaging: Take 1 teaspoon (5 ml), or more as needed or as directed by your physician, after meals and before bed. -These general directions address the most common times for reflux to occur, but our Rescue products may be taken anytime. Some individuals may take our product preemptively, when they know they will suffer from reflux, or as needed - when discomfort arises. (If taken around food, it should be consumed last.) -You do not have to take 1 teaspoon (5 ml) of the product. While one teaspoon (5ml) may be the perfect average amount to relieve reflux suffering in some, others may require more or less. You may adjust the amount of Mint Chocolate Rescue and Vanilla Caramel Rescue to the lowest amount necessary to meet your individual needs to improve your quality of life. -You may dilute the product if it is too viscous for you to consume. Keep in mind, however, that the thickness of the product was formulated to provide optimal coating and protection of your throat and esophagus. Though diluting the product is possible, it may reduce the protective function and/or length of action. -This can be used in conjunction with reflux medications and lifestyle changes.  100% ALL-NATURAL  Paraben FREE, glycerin FREE, & potassium FREE  Made entirely from all-natural ingredients considered safe for children and during pregnancy  No known side effects  All-natural flavor Gluten FREE  Allergen FREE  Vegan  Can find more information here: NameSeizer.co.nz   Miralax  is an osmotic  laxative.  It only brings more water into the stool.  This is safe to take daily.  Can take up to 17 gram of miralax  twice a day.  Mix with juice or coffee.  Start 1 capful at night for 3-4 days and reassess your response in 3-4 days.  You can increase and decrease the dose based on your response.  Remember, it can take up to 3-4 days to take effect OR for the effects to wear off.   I often pair this with benefiber in the morning to help assure the stool is not too loose.   Dysphagia precautions:  1.pepcid at night 20 mg 2. Begin meals with warm beverage 3. Eat smaller more frequent meals 4. Eat slowly, taking small bites and sips 5. Alternate solids and liquids 6. Avoid foods/liquids that increase acid production 7. Sit upright during and for 30+ minutes after meals to facilitate esophageal clearing 8. All meats should be chopped finely.   Consider barium swallow  If something gets hung in your esophagus and will not come up or go down, proceed to the emergency room.    Silent reflux: Not all heartburn burns...SABRASABRASABRA  What is LPR? Laryngopharyngeal reflux (LPR) or silent reflux is a condition in which acid that is made in the stomach travels up the esophagus (swallowing tube) and gets to the throat. Not everyone with reflux has a lot of heartburn or indigestion. In  fact, many people with LPR never have heartburn. This is why LPR is called SILENT REFLUX, and the terms Silent reflux and LPR are often used interchangeably. Because LPR is silent, it is sometimes difficult to diagnose.  How can you tell if you have LPR?  Chronic hoarseness- Some people have hoarseness that comes and goes throat clearing  Cough It can cause shortness of breath and cause asthma like symptoms. a feeling of a lump in the throat  difficulty swallowing a problem with too much nose and throat drainage.  Some people will feel their esophagus spasm which feels like their heart beating hard and fast, this  will usually be after a meal, at rest, or lying down at night.    How do I treat this? Treatment for LPR should be individualized, and your doctor will suggest the best treatment for you. Generally there are several treatments for LPR: changing habits and diet to reduce reflux,  medications to reduce stomach acid, and  surgery to prevent reflux. Most people with LPR need to modify how and when they eat, as well as take some medication, to get well. Sometimes, nonprescription liquid antacids, such as Maalox, Gelucil and Mylanta are recommended. When used, these antacids should be taken four times each day - one tablespoon one hour after each meal and before bedtime. Dietary and lifestyle changes alone are not often enough to control LPR - medications that reduce stomach acid are also usually needed. These must be prescribed by our doctor.   TIPS FOR REDUCING REFLUX AND LPR Control your LIFE-STYLE and your DIET! If you use tobacco, QUIT.  Smoking makes you reflux. After every cigarette you have some LPR.  Don't wear clothing that is too tight, especially around the waist (trousers, corsets, belts).  Do not lie down just after eating...in fact, do not eat within three hours of bedtime.  You should be on a low-fat diet.  Limit your intake of red meat.  Limit your intake of butter.  Avoid fried foods.  Avoid chocolate  Avoid cheese.  Avoid eggs. Specifically avoid caffeine (especially coffee and tea), soda pop (especially cola) and mints.  Avoid alcoholic beverages, particularly in the evening.
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
## 2024-01-29 ENCOUNTER — Telehealth: Payer: Self-pay | Admitting: Internal Medicine

## 2024-01-29 NOTE — Telephone Encounter (Signed)
 Lm on vm for patient to return call. Patient had + cologuard and will need to keep colonoscopy appt.

## 2024-01-29 NOTE — Progress Notes (Signed)
 I agree with the assessment and plan as outlined by Ms. Craig.

## 2024-01-29 NOTE — Telephone Encounter (Signed)
 Patient called and stated that she would like to cancel her Colonoscopy with Dr. Federico due to her being told that due to her age it was no required. Please advise.

## 2024-01-29 NOTE — Telephone Encounter (Signed)
 Patient returned call. Patient states that she was told that she did not have to have a colonoscopy due to her age. I told patient that she will have to have colonoscopy due to + cologuard. I told patient that depending on what is found at the time of colonoscopy she may not need any future colonoscopies. Patient states that she will keep colonoscopy appt as scheduled.

## 2024-01-29 NOTE — Telephone Encounter (Signed)
 Patient called back. Patient states that she said it would be up to her if she wanted to proceed with colonoscopy or not. Patient states that she was just diagnosed with kidney disease and can't deal with anything else. Please advise, thanks

## 2024-01-30 ENCOUNTER — Telehealth: Payer: Self-pay | Admitting: Family Medicine

## 2024-01-30 DIAGNOSIS — E119 Type 2 diabetes mellitus without complications: Secondary | ICD-10-CM

## 2024-01-30 NOTE — Telephone Encounter (Signed)
 Pt was wondering if she can have lab orders placed for her diabetic and kidney conditions.

## 2024-01-31 NOTE — Telephone Encounter (Signed)
 I put in the follow up orders for BMET and A1c.  Please see about scheduling a lab visit prior to an OV.  Thanks.

## 2024-02-01 NOTE — Telephone Encounter (Signed)
 LVM to schedule for OV and prior lab work

## 2024-02-02 ENCOUNTER — Ambulatory Visit: Payer: Self-pay | Admitting: *Deleted

## 2024-02-02 ENCOUNTER — Ambulatory Visit: Admitting: Family Medicine

## 2024-02-02 ENCOUNTER — Encounter: Payer: Self-pay | Admitting: Family Medicine

## 2024-02-02 VITALS — BP 126/74 | HR 105 | Resp 18 | Ht <= 58 in | Wt 138.4 lb

## 2024-02-02 DIAGNOSIS — N1832 Chronic kidney disease, stage 3b: Secondary | ICD-10-CM

## 2024-02-02 DIAGNOSIS — E1169 Type 2 diabetes mellitus with other specified complication: Secondary | ICD-10-CM

## 2024-02-02 DIAGNOSIS — E785 Hyperlipidemia, unspecified: Secondary | ICD-10-CM

## 2024-02-02 DIAGNOSIS — E790 Hyperuricemia without signs of inflammatory arthritis and tophaceous disease: Secondary | ICD-10-CM

## 2024-02-02 DIAGNOSIS — M79672 Pain in left foot: Secondary | ICD-10-CM

## 2024-02-02 MED ORDER — PREDNISONE 10 MG PO TABS: 10.0000 mg | ORAL_TABLET | Freq: Two times a day (BID) | ORAL | 0 refills | Status: DC

## 2024-02-02 NOTE — Telephone Encounter (Signed)
 Copied from CRM [id number]. Topic: Clinical - Red Word Triage >> Feb 02, 2024  1:20 PM Shereese L wrote: [facility] that prompted transfer to Nurse Triage: Arthritis flare up, knots on top of foot, left foot is swollen Reason for Disposition  [1] Swollen foot AND [2] no fever  (Exceptions: Localized bump from bunions, calluses, insect bite, sting.)  Answer Assessment - Initial Assessment Questions 1. ONSET: When did the pain start?      Left foot is swollen.  On side of foot I have a big lump.   Now it's on top of my foot where I have 2 screws from previous foot surgery.   I can't get my shoe on.   It's just my left foot.  I have CKD I can't take ibuprofen or Mobic .    This has been going on almost a month.   I don't know what else to do.   I've tried ice.   I have an appt on 9/5 and 9/11 but this is hurting now.  2. LOCATION: Where is the pain located?      Left foot 3. PAIN: How bad is the pain?    (Scale 1-10; or mild, moderate, severe)     My foot is swollen and the lump is on top of my foot.  It started on the side of my foot.   I can't get a shoe on it's because it's so swollen.   4. WORK OR EXERCISE: Has there been any recent work or exercise that involved this part of the body?      No 5. CAUSE: What do you think is causing the foot pain?     Maybe arthritis flare up.    I have hard ware in this foot. 6. OTHER SYMPTOMS: Do you have any other symptoms? (e.g., leg pain, rash, fever, numbness)     No 7. PREGNANCY: Is there any chance you are pregnant? When was your last menstrual period?     N/A due to age  Protocols used: Foot Pain-A-AH No appts available with [facility] with any of the providers.   Scheduled with Dr. Glenard at [facility] for 3:40 today.   Pt made aware of address and was familiar with where the [facility] office was.        FYI Only or Action Required?: FYI only for provider.  Patient was last seen in primary care on 09/26/2023 by  Cleatus Arlyss RAMAN, MD.  Called Nurse Triage reporting Foot Swelling.Left foot has a knot on top and is swollen and very painful.     Symptoms began a week ago.  Interventions attempted: Ice/heat application.Been using ice.   Unable to use NSAIDS due to CKD.    Symptoms are: gradually worsening.  Triage Disposition: See Physician Within 24 Hours  Patient/caregiver understands and will follow disposition?: Yes

## 2024-02-02 NOTE — Telephone Encounter (Signed)
 Noted. Thanks.
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
## 2024-02-03 DIAGNOSIS — E785 Hyperlipidemia, unspecified: Secondary | ICD-10-CM | POA: Insufficient documentation

## 2024-02-06 ENCOUNTER — Encounter (HOSPITAL_COMMUNITY): Payer: Self-pay | Admitting: Internal Medicine

## 2024-02-06 ENCOUNTER — Ambulatory Visit (HOSPITAL_COMMUNITY): Admitting: Anesthesiology

## 2024-02-06 ENCOUNTER — Telehealth: Payer: Self-pay

## 2024-02-06 ENCOUNTER — Encounter (HOSPITAL_COMMUNITY)
Admission: RE | Disposition: A | Discharge status: Home / Self Care | Payer: Self-pay | Source: Home / Self Care | Attending: Internal Medicine

## 2024-02-06 ENCOUNTER — Ambulatory Visit (HOSPITAL_COMMUNITY)
Admission: RE | Admit: 2024-02-06 | Discharge: 2024-02-06 | Disposition: A | Discharge status: Home / Self Care | Attending: Internal Medicine | Admitting: Internal Medicine

## 2024-02-06 ENCOUNTER — Other Ambulatory Visit: Payer: Self-pay

## 2024-02-06 ENCOUNTER — Ambulatory Visit (HOSPITAL_BASED_OUTPATIENT_CLINIC_OR_DEPARTMENT_OTHER): Admitting: Anesthesiology

## 2024-02-06 DIAGNOSIS — K635 Polyp of colon: Secondary | ICD-10-CM

## 2024-02-06 DIAGNOSIS — Z800 Family history of malignant neoplasm of digestive organs: Secondary | ICD-10-CM | POA: Insufficient documentation

## 2024-02-06 DIAGNOSIS — N189 Chronic kidney disease, unspecified: Secondary | ICD-10-CM | POA: Diagnosis not present

## 2024-02-06 DIAGNOSIS — D123 Benign neoplasm of transverse colon: Secondary | ICD-10-CM | POA: Insufficient documentation

## 2024-02-06 DIAGNOSIS — Z1211 Encounter for screening for malignant neoplasm of colon: Secondary | ICD-10-CM

## 2024-02-06 DIAGNOSIS — Z8673 Personal history of transient ischemic attack (TIA), and cerebral infarction without residual deficits: Secondary | ICD-10-CM | POA: Diagnosis not present

## 2024-02-06 DIAGNOSIS — R195 Other fecal abnormalities: Secondary | ICD-10-CM | POA: Diagnosis not present

## 2024-02-06 DIAGNOSIS — J45909 Unspecified asthma, uncomplicated: Secondary | ICD-10-CM | POA: Insufficient documentation

## 2024-02-06 DIAGNOSIS — D122 Benign neoplasm of ascending colon: Secondary | ICD-10-CM | POA: Diagnosis not present

## 2024-02-06 DIAGNOSIS — K5730 Diverticulosis of large intestine without perforation or abscess without bleeding: Secondary | ICD-10-CM | POA: Diagnosis not present

## 2024-02-06 DIAGNOSIS — I129 Hypertensive chronic kidney disease with stage 1 through stage 4 chronic kidney disease, or unspecified chronic kidney disease: Secondary | ICD-10-CM | POA: Insufficient documentation

## 2024-02-06 DIAGNOSIS — E1122 Type 2 diabetes mellitus with diabetic chronic kidney disease: Secondary | ICD-10-CM | POA: Diagnosis not present

## 2024-02-06 DIAGNOSIS — D125 Benign neoplasm of sigmoid colon: Secondary | ICD-10-CM | POA: Insufficient documentation

## 2024-02-06 DIAGNOSIS — I10 Essential (primary) hypertension: Secondary | ICD-10-CM | POA: Diagnosis not present

## 2024-02-06 DIAGNOSIS — K648 Other hemorrhoids: Secondary | ICD-10-CM | POA: Insufficient documentation

## 2024-02-06 HISTORY — DX: Chronic kidney disease, unspecified: N18.9

## 2024-02-06 HISTORY — PX: COLONOSCOPY: SHX5424

## 2024-02-06 SURGERY — COLONOSCOPY
Anesthesia: Monitor Anesthesia Care

## 2024-02-06 MED ORDER — PROPOFOL 500 MG/50ML IV EMUL
INTRAVENOUS | Status: DC | PRN
  Administered 2024-02-06: 130 ug/kg/min via INTRAVENOUS

## 2024-02-06 MED ORDER — PROPOFOL 1000 MG/100ML IV EMUL
INTRAVENOUS | Status: AC
  Filled 2024-02-06: qty 200

## 2024-02-06 MED ORDER — SODIUM CHLORIDE 0.9 % IV SOLN
INTRAVENOUS | Status: AC | PRN
  Administered 2024-02-06: 500 mL via INTRAMUSCULAR

## 2024-02-06 MED ORDER — SODIUM CHLORIDE 0.9 % IV SOLN: INTRAVENOUS | Status: DC

## 2024-02-06 MED ORDER — PROPOFOL 10 MG/ML IV BOLUS
INTRAVENOUS | Status: DC | PRN
  Administered 2024-02-06 (×2): 10 mg via INTRAVENOUS
  Administered 2024-02-06: 20 mg via INTRAVENOUS
  Administered 2024-02-06: 10 mg via INTRAVENOUS

## 2024-02-06 MED ORDER — PROPOFOL 500 MG/50ML IV EMUL
INTRAVENOUS | Status: AC
  Filled 2024-02-06: qty 50
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
## 2024-02-06 NOTE — Telephone Encounter (Signed)
 Referral was sent to [facility]. Left message for pt to call back

## 2024-02-06 NOTE — Discharge Instructions (Signed)

## 2024-02-06 NOTE — Transfer of Care (Signed)
 Immediate Anesthesia Transfer of Care Note  Patient: Katie [name]  Procedure(s) Performed: COLONOSCOPY  Patient Location: PACU  Anesthesia Type:MAC  Level of Consciousness: sedated  Airway & Oxygen Therapy: Patient Spontanous Breathing and Patient connected to face mask oxygen  Post-op Assessment: Report given to RN and Post -op Vital signs reviewed and stable  Post vital signs: Reviewed and stable  Last Vitals:  Vitals Value Taken Time  BP    Temp    Pulse    Resp 16 02/06/24 09:11  SpO2    Vitals shown include unfiled device data.  Last Pain:  Vitals:   02/06/24 9361  TempSrc: Temporal  PainSc: 0-No pain         Complications: No notable events documented.

## 2024-02-06 NOTE — Telephone Encounter (Signed)
-----   Message from Rosario JAYSON Kidney sent at 02/06/2024  9:23 AM EDT ----- Hi, let's refer to colorectal surgery please for significant sigmoid colon narrowing noted on colonoscopy. Thanks.

## 2024-02-06 NOTE — Op Note (Addendum)
 [facility] Patient Name: Katie [name] Procedure Date: 02/06/2024 MRN: [id number] Attending MD: Rosario Estefana Kidney , , [id number] Date of Birth: [birth year] CSN: [id number] Age: 78 Admit Type: Outpatient Procedure:                Colonoscopy Indications:              Positive Cologuard test Providers:                Rosario Estefana Kidney, Willy Hummer, RN, Corene Southgate, Technician Referring MD:             Rosario Estefana Kidney Medicines:                Monitored Anesthesia Care Complications:            No immediate complications. Estimated Blood Loss:     Estimated blood loss was minimal. Procedure:                Pre-Anesthesia Assessment:                           - Prior to the procedure, a History and Physical                            was performed, and patient medications and                            allergies were reviewed. The patient's tolerance of                            previous anesthesia was also reviewed. The risks                            and benefits of the procedure and the sedation                            options and risks were discussed with the patient.                            All questions were answered, and informed consent                            was obtained. Prior Anticoagulants: The patient has                            taken no anticoagulant or antiplatelet agents. ASA                            Grade Assessment: III - A patient with severe                            systemic disease. After reviewing the risks and  benefits, the patient was deemed in satisfactory                            condition to undergo the procedure.                           After obtaining informed consent, the colonoscope                            was passed under direct vision. Throughout the                            procedure, the patient's blood pressure, pulse, and                             oxygen saturations were monitored continuously. The                            CF-HQ190L ([id number]) Olympus colonoscope was                            introduced through the anus and advanced to the the                            cecum, identified by appendiceal orifice and                            ileocecal valve. The patient tolerated the                            procedure well. The quality of the bowel                            preparation was adequate. The terminal ileum,                            ileocecal valve, appendiceal orifice, and rectum                            were photographed. The colonoscopy was technically                            difficult and complex due to multiple diverticula                            in the colon. Successful completion of the                            procedure was aided by changing the patient's                            position, using manual pressure and withdrawing the  scope and replacing with the pediatric colonoscope. Scope In: 7:36:54 AM Scope Out: 9:05:01 AM Scope Withdrawal Time: 0 hours 31 minutes 52 seconds  Total Procedure Duration: 1 hour 28 minutes 7 seconds  Findings:      Five sessile polyps were found in the transverse colon and ascending       colon. The polyps were 3 to 10 mm in size. These polyps were removed       with a cold snare. Resection and retrieval were complete.      Multiple large-mouthed diverticula were found in the sigmoid colon,       descending colon, transverse colon and ascending colon. There was       narrowing of the colon in association with the diverticular opening.      A 5 mm polyp was found in the sigmoid colon. The polyp was sessile. The       polyp was removed with a cold snare. Resection and retrieval were       complete.      Non-bleeding internal hemorrhoids were found during retroflexion. Impression:               - Five 3 to 10 mm polyps in the transverse  colon                            and in the ascending colon, removed with a cold                            snare. Resected and retrieved.                           - Severe diverticulosis in the sigmoid colon, in                            the descending colon, in the transverse colon and                            in the ascending colon. There was narrowing of the                            colon in association with the diverticular opening.                           - One 5 mm polyp in the sigmoid colon, removed with                            a cold snare. Resected and retrieved.                           - Non-bleeding internal hemorrhoids. Moderate Sedation:      Not Applicable - Patient had care per Anesthesia. Recommendation:           - Discharge patient to home (with escort).                           - Await pathology results.                           -  Would recommend referral to colorectal surgery                            for consideration of sigmoid colon resection due to                            significant narrowing from diverticular disease.                           - Would recommend a 2-day prep for future                            colonoscopies.                           - The findings and recommendations were discussed                            with the patient. Procedure Code(s):        --- Professional ---                           [phone], Colonoscopy, flexible; with removal of                            tumor(s), polyp(s), or other lesion(s) by snare                            technique Diagnosis Code(s):        --- Professional ---                           D12.3, Benign neoplasm of transverse colon (hepatic                            flexure or splenic flexure)                           D12.2, Benign neoplasm of ascending colon                           K64.8, Other hemorrhoids                           D12.5, Benign neoplasm of sigmoid colon                            R19.5, Other fecal abnormalities                           K57.30, Diverticulosis of large intestine without                            perforation or abscess without bleeding CPT copyright 2022 [employer]. All rights reserved. The codes documented in this report are preliminary and upon coder review may  be revised to meet current compliance requirements. Dr Estefana  Federico Rosario Estefana Federico,  02/06/2024 9:16:12 AM Number of Addenda: 0

## 2024-02-07 ENCOUNTER — Ambulatory Visit: Payer: Self-pay | Admitting: Internal Medicine

## 2024-02-07 LAB — SURGICAL PATHOLOGY

## 2024-02-07 NOTE — Telephone Encounter (Signed)
 Pt made aware of the recommendation for the referral. Pt was notified that the referral has been sent to [facility] along with her records.  Pt was notified that their office will reach out to her. Pt verbalized understanding with all questions answered.

## 2024-02-07 NOTE — Anesthesia Postprocedure Evaluation (Signed)
 Anesthesia Post Note  Patient: Katie [name]  Procedure(s) Performed: COLONOSCOPY     Patient location during evaluation: Endoscopy Anesthesia Type: MAC Level of consciousness: awake Pain management: pain level controlled Vital Signs Assessment: post-procedure vital signs reviewed and stable Respiratory status: spontaneous breathing, nonlabored ventilation and respiratory function stable Cardiovascular status: blood pressure returned to baseline and stable Postop Assessment: no apparent nausea or vomiting Anesthetic complications: no   No notable events documented.  Last Vitals:  Vitals:   02/06/24 0920 02/06/24 0925  BP: 137/87   Pulse: 84 83  Resp: 20 18  Temp:    SpO2: 99% 99%    Last Pain:  Vitals:   02/06/24 0925  TempSrc:   PainSc: 0-No pain                 Jaclynne Baldo P Kolbee Stallman

## 2024-02-09 ENCOUNTER — Other Ambulatory Visit

## 2024-02-09 DIAGNOSIS — E119 Type 2 diabetes mellitus without complications: Secondary | ICD-10-CM | POA: Diagnosis not present

## 2024-02-09 LAB — BASIC METABOLIC PANEL WITH GFR
BUN: 31 mg/dL — ABNORMAL HIGH (ref 6–23)
CO2: 25 meq/L (ref 19–32)
Calcium: 9.1 mg/dL (ref 8.4–10.5)
Chloride: 104 meq/L (ref 96–112)
Creatinine, Ser: 1.24 mg/dL — ABNORMAL HIGH (ref 0.40–1.20)
GFR: 42.1 mL/min — ABNORMAL LOW (ref >60.00)
Glucose, Bld: 94 mg/dL (ref 70–99)
Potassium: 4.2 meq/L (ref 3.5–5.1)
Sodium: 141 meq/L (ref 135–145)

## 2024-02-09 LAB — HEMOGLOBIN A1C: Hgb A1c MFr Bld: 6.7 % — ABNORMAL HIGH (ref 4.6–6.5)

## 2024-02-11 ENCOUNTER — Ambulatory Visit: Payer: Self-pay | Admitting: Family Medicine

## 2024-02-13 DIAGNOSIS — R92322 Mammographic fibroglandular density, left breast: Secondary | ICD-10-CM | POA: Diagnosis not present

## 2024-02-13 DIAGNOSIS — Z1231 Encounter for screening mammogram for malignant neoplasm of breast: Secondary | ICD-10-CM | POA: Diagnosis not present

## 2024-02-13 DIAGNOSIS — Z9011 Acquired absence of right breast and nipple: Secondary | ICD-10-CM | POA: Diagnosis not present

## 2024-02-13 DIAGNOSIS — Z9221 Personal history of antineoplastic chemotherapy: Secondary | ICD-10-CM | POA: Diagnosis not present

## 2024-02-13 DIAGNOSIS — Z853 Personal history of malignant neoplasm of breast: Secondary | ICD-10-CM | POA: Diagnosis not present

## 2024-02-13 DIAGNOSIS — Z9229 Personal history of other drug therapy: Secondary | ICD-10-CM | POA: Diagnosis not present

## 2024-02-13 LAB — HM MAMMOGRAPHY: HM Mammogram: NORMAL (ref 0–4)

## 2024-02-15 ENCOUNTER — Telehealth: Payer: Self-pay

## 2024-02-15 ENCOUNTER — Ambulatory Visit (INDEPENDENT_AMBULATORY_CARE_PROVIDER_SITE_OTHER): Admitting: Family Medicine

## 2024-02-15 ENCOUNTER — Encounter: Payer: Self-pay | Admitting: Family Medicine

## 2024-02-15 VITALS — BP 128/64 | HR 99 | Temp 97.5°F | Ht <= 58 in | Wt 140.6 lb

## 2024-02-15 DIAGNOSIS — R7989 Other specified abnormal findings of blood chemistry: Secondary | ICD-10-CM

## 2024-02-15 DIAGNOSIS — R011 Cardiac murmur, unspecified: Secondary | ICD-10-CM | POA: Diagnosis not present

## 2024-02-15 DIAGNOSIS — E119 Type 2 diabetes mellitus without complications: Secondary | ICD-10-CM

## 2024-02-15 NOTE — Patient Instructions (Addendum)
 Recheck BMET and A1c in about 3 months before a visit.  Let me update Dr. Douglas   Consider options with ARB vs jardiance.  Let me get his input.  Take care.  Glad to see you.  Let me know when you want to repeat your echo.

## 2024-02-15 NOTE — Progress Notes (Unsigned)
 Diabetes:  No meds.   Hypoglycemic episodes:no Hyperglycemic episodes:no Feet problems: no Blood Sugars averaging: not checked.   eye exam within last year: yes A1c d/w pt.    D/w pt about most recent creatinine.  She is off nsaids.   She is still dealing with joint pain at baseline.  D/w pt about prev nsaids and also lamictal .  Benefit of lamictal  is likely outweighing risk.  She is off PPI.    D/w pt about potentially seeing nutrition, she'll let me know if she wants to get set up.    I need to update renal clinic about her labs. Dr. Douglas.   Consider ACE vs jardiance.  Recheck A1c in about 3 months.    She is putting up with joint pain for now.   D/w pt about repeating echo, given timeline from prev echo.  She wanted to defer at this point.    PMH and SH reviewed  Meds, vitals, and allergies reviewed.   ROS: Per HPI unless specifically indicated in ROS section   GEN: nad, alert and oriented HEENT: ncat NECK: supple w/o LA CV: rrr. SEM noted.  PULM: ctab, no inc wob ABD: soft, +bs EXT: no edema SKIN: no acute rash

## 2024-02-15 NOTE — Telephone Encounter (Signed)
 Reached out to patient and left voicemail per DPR that her mammogram came back normal.

## 2024-02-18 ENCOUNTER — Telehealth: Payer: Self-pay | Admitting: Family Medicine

## 2024-02-18 NOTE — Assessment & Plan Note (Signed)
 Not an emergent issue.  I asked her to let me know when she wants to repeat her echo.

## 2024-02-18 NOTE — Telephone Encounter (Signed)
 Please send a copy of most recent labs and my clinic note to Dr. Douglas.  I need his input about options going forward, ie starting an ARB versus Jardiance versus observation and rechecking her labs at the next set of office visits, since her creatinine had improved and her A1c was below 7.  Please let me know if he would like to discuss her case/please let me know his thoughts.  Thanks.

## 2024-02-18 NOTE — Assessment & Plan Note (Signed)
 Most recent creatinine has improved.  Recheck BMET and A1c in about 3 months before a visit.  I will update Dr. Douglas.  See following phone note.  Consider options with ARB vs jardiance.  I will ask for his input.

## 2024-02-18 NOTE — Assessment & Plan Note (Signed)
 Most recent A1c 6.7.  Recheck BMET and A1c in about 3 months before a visit.   We did not yet start Jardiance.  I want input from renal clinic first.

## 2024-02-19 NOTE — Telephone Encounter (Signed)
 Labs forwarded to Dr. Douglas as requested.

## 2024-03-07 ENCOUNTER — Encounter: Payer: Self-pay | Admitting: Family Medicine

## 2024-03-07 NOTE — Telephone Encounter (Signed)
 I am working on getting him scheduled for a sooner appointment.  I am addressing you about the additional information. This is in reference to her spouse mrn [phone]

## 2024-03-10 NOTE — Telephone Encounter (Addendum)
 See note in spouse Barry's chart.

## 2024-03-12 DIAGNOSIS — E785 Hyperlipidemia, unspecified: Secondary | ICD-10-CM | POA: Diagnosis not present

## 2024-03-12 DIAGNOSIS — I129 Hypertensive chronic kidney disease with stage 1 through stage 4 chronic kidney disease, or unspecified chronic kidney disease: Secondary | ICD-10-CM | POA: Diagnosis not present

## 2024-03-12 DIAGNOSIS — R8290 Unspecified abnormal findings in urine: Secondary | ICD-10-CM | POA: Diagnosis not present

## 2024-03-12 DIAGNOSIS — E875 Hyperkalemia: Secondary | ICD-10-CM | POA: Diagnosis not present

## 2024-03-12 DIAGNOSIS — N184 Chronic kidney disease, stage 4 (severe): Secondary | ICD-10-CM | POA: Diagnosis not present

## 2024-03-12 DIAGNOSIS — R809 Proteinuria, unspecified: Secondary | ICD-10-CM | POA: Diagnosis not present

## 2024-03-21 DIAGNOSIS — E875 Hyperkalemia: Secondary | ICD-10-CM | POA: Diagnosis not present

## 2024-03-21 DIAGNOSIS — N1832 Chronic kidney disease, stage 3b: Secondary | ICD-10-CM | POA: Diagnosis not present

## 2024-03-21 DIAGNOSIS — R809 Proteinuria, unspecified: Secondary | ICD-10-CM | POA: Diagnosis not present

## 2024-03-21 DIAGNOSIS — E785 Hyperlipidemia, unspecified: Secondary | ICD-10-CM | POA: Diagnosis not present

## 2024-03-21 DIAGNOSIS — I129 Hypertensive chronic kidney disease with stage 1 through stage 4 chronic kidney disease, or unspecified chronic kidney disease: Secondary | ICD-10-CM | POA: Diagnosis not present

## 2024-04-11 ENCOUNTER — Ambulatory Visit: Payer: Medicare HMO | Admitting: Neurology

## 2024-04-11 ENCOUNTER — Encounter: Payer: Self-pay | Admitting: Neurology

## 2024-04-11 VITALS — BP 132/81 | HR 97 | Ht <= 58 in | Wt 138.5 lb

## 2024-04-11 DIAGNOSIS — G40209 Localization-related (focal) (partial) symptomatic epilepsy and epileptic syndromes with complex partial seizures, not intractable, without status epilepticus: Secondary | ICD-10-CM | POA: Diagnosis not present

## 2024-04-11 DIAGNOSIS — G250 Essential tremor: Secondary | ICD-10-CM

## 2024-04-11 NOTE — Patient Instructions (Addendum)
 Continue current medications including Lamotrigine  100 mg daily  Continue to follow up with PCP  Return in a year or sooner if worse
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
## 2024-04-17 DIAGNOSIS — E785 Hyperlipidemia, unspecified: Secondary | ICD-10-CM | POA: Diagnosis not present

## 2024-04-17 DIAGNOSIS — E875 Hyperkalemia: Secondary | ICD-10-CM | POA: Diagnosis not present

## 2024-04-17 DIAGNOSIS — I129 Hypertensive chronic kidney disease with stage 1 through stage 4 chronic kidney disease, or unspecified chronic kidney disease: Secondary | ICD-10-CM | POA: Diagnosis not present

## 2024-04-17 DIAGNOSIS — N1832 Chronic kidney disease, stage 3b: Secondary | ICD-10-CM | POA: Diagnosis not present

## 2024-04-23 DIAGNOSIS — E875 Hyperkalemia: Secondary | ICD-10-CM | POA: Diagnosis not present

## 2024-04-23 DIAGNOSIS — E1122 Type 2 diabetes mellitus with diabetic chronic kidney disease: Secondary | ICD-10-CM | POA: Diagnosis not present

## 2024-04-23 DIAGNOSIS — N1832 Chronic kidney disease, stage 3b: Secondary | ICD-10-CM | POA: Diagnosis not present

## 2024-04-23 DIAGNOSIS — I129 Hypertensive chronic kidney disease with stage 1 through stage 4 chronic kidney disease, or unspecified chronic kidney disease: Secondary | ICD-10-CM | POA: Diagnosis not present

## 2024-04-23 DIAGNOSIS — E785 Hyperlipidemia, unspecified: Secondary | ICD-10-CM | POA: Diagnosis not present

## 2024-04-23 DIAGNOSIS — R809 Proteinuria, unspecified: Secondary | ICD-10-CM | POA: Diagnosis not present

## 2024-05-06 ENCOUNTER — Encounter: Payer: Self-pay | Admitting: Family Medicine

## 2024-05-08 DIAGNOSIS — M7502 Adhesive capsulitis of left shoulder: Secondary | ICD-10-CM | POA: Diagnosis not present

## 2024-05-08 DIAGNOSIS — M25512 Pain in left shoulder: Secondary | ICD-10-CM | POA: Diagnosis not present

## 2024-05-08 DIAGNOSIS — M75122 Complete rotator cuff tear or rupture of left shoulder, not specified as traumatic: Secondary | ICD-10-CM | POA: Diagnosis not present

## 2024-05-08 NOTE — Telephone Encounter (Signed)
 See note in his chart.  He had office visit in the meantime.

## 2024-05-23 ENCOUNTER — Other Ambulatory Visit

## 2024-05-23 DIAGNOSIS — E119 Type 2 diabetes mellitus without complications: Secondary | ICD-10-CM

## 2024-05-23 LAB — BASIC METABOLIC PANEL WITH GFR
BUN: 26 mg/dL — ABNORMAL HIGH (ref 6–23)
CO2: 26 meq/L (ref 19–32)
Calcium: 9.6 mg/dL (ref 8.4–10.5)
Chloride: 102 meq/L (ref 96–112)
Creatinine, Ser: 1.33 mg/dL — ABNORMAL HIGH (ref 0.40–1.20)
GFR: 38.62 mL/min — ABNORMAL LOW (ref >60.00)
Glucose, Bld: 104 mg/dL — ABNORMAL HIGH (ref 70–99)
Potassium: 4.2 meq/L (ref 3.5–5.1)
Sodium: 138 meq/L (ref 135–145)

## 2024-05-23 LAB — POCT GLYCOSYLATED HEMOGLOBIN (HGB A1C): Hemoglobin A1C: 6.2 % — AB (ref 4.0–5.6)

## 2024-05-23 NOTE — Addendum Note (Signed)
 Addended by: HOPE VEVA PARAS on: 05/23/2024 09:34 AM   Modules accepted: Orders

## 2024-05-29 ENCOUNTER — Ambulatory Visit: Payer: Self-pay | Admitting: Family Medicine

## 2024-10-04 ENCOUNTER — Ambulatory Visit

## 2025-04-17 ENCOUNTER — Ambulatory Visit: Admitting: Neurology
